# Patient Record
Sex: Male | Born: 1960 | Race: Black or African American | Hispanic: No | Smoking: Current every day smoker
Health system: Southern US, Community
[De-identification: ages and names within clinical notes are randomized; demographics above are authoritative.]

## PROBLEM LIST (undated history)

## (undated) DIAGNOSIS — M549 Dorsalgia, unspecified: Secondary | ICD-10-CM

## (undated) DIAGNOSIS — J449 Chronic obstructive pulmonary disease, unspecified: Secondary | ICD-10-CM

## (undated) DIAGNOSIS — R194 Change in bowel habit: Secondary | ICD-10-CM

## (undated) DIAGNOSIS — I10 Essential (primary) hypertension: Secondary | ICD-10-CM

## (undated) DIAGNOSIS — R05 Cough: Secondary | ICD-10-CM

## (undated) DIAGNOSIS — R140 Abdominal distension (gaseous): Secondary | ICD-10-CM

## (undated) DIAGNOSIS — J45909 Unspecified asthma, uncomplicated: Secondary | ICD-10-CM

## (undated) DIAGNOSIS — C20 Malignant neoplasm of rectum: Secondary | ICD-10-CM

## (undated) DIAGNOSIS — K5900 Constipation, unspecified: Secondary | ICD-10-CM

## (undated) DIAGNOSIS — R159 Full incontinence of feces: Secondary | ICD-10-CM

## (undated) DIAGNOSIS — K219 Gastro-esophageal reflux disease without esophagitis: Secondary | ICD-10-CM

## (undated) DIAGNOSIS — R630 Anorexia: Secondary | ICD-10-CM

## (undated) DIAGNOSIS — R059 Cough, unspecified: Secondary | ICD-10-CM

## (undated) DIAGNOSIS — R0602 Shortness of breath: Secondary | ICD-10-CM

## (undated) DIAGNOSIS — C801 Malignant (primary) neoplasm, unspecified: Secondary | ICD-10-CM

## (undated) DIAGNOSIS — K625 Hemorrhage of anus and rectum: Secondary | ICD-10-CM

## (undated) DIAGNOSIS — R197 Diarrhea, unspecified: Secondary | ICD-10-CM

## (undated) DIAGNOSIS — R61 Generalized hyperhidrosis: Secondary | ICD-10-CM

## (undated) DIAGNOSIS — R634 Abnormal weight loss: Secondary | ICD-10-CM

## (undated) HISTORY — DX: Change in bowel habit: R19.4

## (undated) HISTORY — DX: Hemorrhage of anus and rectum: K62.5

## (undated) HISTORY — DX: Full incontinence of feces: R15.9

## (undated) HISTORY — DX: Generalized hyperhidrosis: R61

## (undated) HISTORY — DX: Constipation, unspecified: K59.00

## (undated) HISTORY — DX: Cough, unspecified: R05.9

## (undated) HISTORY — PX: ABDOMINAL SURGERY: SHX537

## (undated) HISTORY — DX: Gastro-esophageal reflux disease without esophagitis: K21.9

## (undated) HISTORY — DX: Shortness of breath: R06.02

## (undated) HISTORY — PX: OTHER SURGICAL HISTORY: SHX169

## (undated) HISTORY — DX: Dorsalgia, unspecified: M54.9

## (undated) HISTORY — DX: Abdominal distension (gaseous): R14.0

## (undated) HISTORY — DX: Diarrhea, unspecified: R19.7

## (undated) HISTORY — DX: Anorexia: R63.0

## (undated) HISTORY — DX: Abnormal weight loss: R63.4

---
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
## 1998-02-13 ENCOUNTER — Emergency Department (HOSPITAL_COMMUNITY): Admission: EM | Admit: 1998-02-13 | Discharge: 1998-02-13 | Payer: Self-pay | Admitting: Emergency Medicine

## 2000-02-06 ENCOUNTER — Ambulatory Visit (HOSPITAL_COMMUNITY): Admission: RE | Admit: 2000-02-06 | Discharge: 2000-02-06 | Payer: Self-pay | Admitting: Chiropractic Medicine

## 2000-02-06 ENCOUNTER — Encounter: Payer: Self-pay | Admitting: Chiropractic Medicine

## 2000-03-07 ENCOUNTER — Encounter: Payer: Self-pay | Admitting: Neurosurgery

## 2000-03-07 ENCOUNTER — Ambulatory Visit (HOSPITAL_COMMUNITY): Admission: RE | Admit: 2000-03-07 | Discharge: 2000-03-07 | Payer: Self-pay | Admitting: Neurosurgery

## 2000-05-01 ENCOUNTER — Ambulatory Visit (HOSPITAL_COMMUNITY): Admission: RE | Admit: 2000-05-01 | Discharge: 2000-05-01 | Payer: Self-pay | Admitting: Neurosurgery

## 2000-05-01 ENCOUNTER — Encounter: Payer: Self-pay | Admitting: Neurosurgery

## 2000-12-29 ENCOUNTER — Emergency Department (HOSPITAL_COMMUNITY): Admission: EM | Admit: 2000-12-29 | Discharge: 2000-12-29 | Payer: Self-pay | Admitting: Emergency Medicine

## 2000-12-29 ENCOUNTER — Encounter: Payer: Self-pay | Admitting: Emergency Medicine

## 2001-08-19 ENCOUNTER — Emergency Department (HOSPITAL_COMMUNITY): Admission: EM | Admit: 2001-08-19 | Discharge: 2001-08-19 | Payer: Self-pay

## 2001-08-19 ENCOUNTER — Encounter: Payer: Self-pay | Admitting: Emergency Medicine

## 2001-09-18 ENCOUNTER — Ambulatory Visit (HOSPITAL_COMMUNITY): Admission: RE | Admit: 2001-09-18 | Discharge: 2001-09-18 | Payer: Self-pay | Admitting: Neurosurgery

## 2001-09-18 ENCOUNTER — Encounter: Payer: Self-pay | Admitting: Neurosurgery

## 2001-09-24 ENCOUNTER — Ambulatory Visit (HOSPITAL_COMMUNITY): Admission: RE | Admit: 2001-09-24 | Discharge: 2001-09-24 | Payer: Self-pay | Admitting: Neurosurgery

## 2001-09-24 ENCOUNTER — Encounter: Payer: Self-pay | Admitting: Neurosurgery

## 2001-10-07 ENCOUNTER — Ambulatory Visit (HOSPITAL_COMMUNITY): Admission: RE | Admit: 2001-10-07 | Discharge: 2001-10-07 | Payer: Self-pay | Admitting: Neurosurgery

## 2001-10-07 ENCOUNTER — Encounter: Payer: Self-pay | Admitting: Neurosurgery

## 2001-12-03 ENCOUNTER — Encounter: Admission: RE | Admit: 2001-12-03 | Discharge: 2001-12-03 | Payer: Self-pay | Admitting: Neurosurgery

## 2001-12-03 ENCOUNTER — Encounter: Payer: Self-pay | Admitting: Neurosurgery

## 2004-03-31 ENCOUNTER — Emergency Department (HOSPITAL_COMMUNITY): Admission: EM | Admit: 2004-03-31 | Discharge: 2004-03-31 | Payer: Self-pay | Admitting: Emergency Medicine

## 2005-12-21 ENCOUNTER — Emergency Department (HOSPITAL_COMMUNITY): Admission: EM | Admit: 2005-12-21 | Discharge: 2005-12-21 | Payer: Self-pay | Admitting: Emergency Medicine

## 2007-05-19 ENCOUNTER — Emergency Department: Payer: Self-pay

## 2007-12-06 ENCOUNTER — Emergency Department (HOSPITAL_COMMUNITY): Admission: EM | Admit: 2007-12-06 | Discharge: 2007-12-06 | Payer: Self-pay | Admitting: Family Medicine

## 2008-03-07 ENCOUNTER — Emergency Department (HOSPITAL_COMMUNITY): Admission: EM | Admit: 2008-03-07 | Discharge: 2008-03-07 | Payer: Self-pay | Admitting: Emergency Medicine

## 2009-10-10 ENCOUNTER — Emergency Department (HOSPITAL_COMMUNITY): Admission: EM | Admit: 2009-10-10 | Discharge: 2009-10-10 | Payer: Self-pay | Admitting: Family Medicine

## 2010-08-09 ENCOUNTER — Emergency Department (HOSPITAL_COMMUNITY): Admission: EM | Admit: 2010-08-09 | Discharge: 2010-04-04 | Payer: Self-pay | Admitting: Emergency Medicine

## 2010-11-12 ENCOUNTER — Inpatient Hospital Stay (INDEPENDENT_AMBULATORY_CARE_PROVIDER_SITE_OTHER)
Admission: RE | Admit: 2010-11-12 | Discharge: 2010-11-12 | Disposition: A | Discharge status: Home / Self Care | Payer: Self-pay | Source: Ambulatory Visit | Attending: Family Medicine | Admitting: Family Medicine

## 2010-11-12 DIAGNOSIS — Z760 Encounter for issue of repeat prescription: Secondary | ICD-10-CM

## 2010-11-12 DIAGNOSIS — I10 Essential (primary) hypertension: Secondary | ICD-10-CM

## 2010-11-12 LAB — POCT I-STAT, CHEM 8
BUN: 8 mg/dL (ref 6–23)
Calcium, Ion: 1.15 mmol/L (ref 1.12–1.32)
Chloride: 104 mEq/L (ref 96–112)
Creatinine, Ser: 1.1 mg/dL (ref 0.4–1.5)
Glucose, Bld: 101 mg/dL — ABNORMAL HIGH (ref 70–99)
HCT: 47 % (ref 39.0–52.0)
Hemoglobin: 16 g/dL (ref 13.0–17.0)
Potassium: 4.5 mEq/L (ref 3.5–5.1)
Sodium: 139 mEq/L (ref 135–145)
TCO2: 24 mmol/L (ref 0–100)

## 2010-11-16 LAB — URINALYSIS, ROUTINE W REFLEX MICROSCOPIC
Bilirubin Urine: NEGATIVE
Glucose, UA: NEGATIVE mg/dL
Hgb urine dipstick: NEGATIVE
Ketones, ur: NEGATIVE mg/dL
Nitrite: NEGATIVE
Protein, ur: NEGATIVE mg/dL
Specific Gravity, Urine: 1.005 (ref 1.005–1.030)
Urobilinogen, UA: 0.2 mg/dL (ref 0.0–1.0)
pH: 5 (ref 5.0–8.0)

## 2010-11-16 LAB — DIFFERENTIAL
Lymphocytes Relative: 21 % (ref 12–46)
Lymphs Abs: 2.8 10*3/uL (ref 0.7–4.0)
Monocytes Absolute: 0.8 10*3/uL (ref 0.1–1.0)
Monocytes Relative: 6 % (ref 3–12)
Neutro Abs: 9.2 10*3/uL — ABNORMAL HIGH (ref 1.7–7.7)
Neutrophils Relative %: 72 % (ref 43–77)

## 2010-11-16 LAB — COMPREHENSIVE METABOLIC PANEL
Albumin: 4.3 g/dL (ref 3.5–5.2)
BUN: 4 mg/dL — ABNORMAL LOW (ref 6–23)
Calcium: 8.4 mg/dL (ref 8.4–10.5)
Creatinine, Ser: 1.2 mg/dL (ref 0.4–1.5)
Glucose, Bld: 93 mg/dL (ref 70–99)
Potassium: 4.7 mEq/L (ref 3.5–5.1)
Total Protein: 7.9 g/dL (ref 6.0–8.3)

## 2010-11-16 LAB — CBC
MCH: 36.7 pg — ABNORMAL HIGH (ref 26.0–34.0)
MCHC: 35.3 g/dL (ref 30.0–36.0)
MCV: 103.8 fL — ABNORMAL HIGH (ref 78.0–100.0)
Platelets: 197 10*3/uL (ref 150–400)
RDW: 12.7 % (ref 11.5–15.5)
WBC: 12.8 10*3/uL — ABNORMAL HIGH (ref 4.0–10.5)

## 2011-01-18 NOTE — Op Note (Signed)
[place]. [facility]  Patient:    Jack Lester, Jack Lester Visit Number: [id number] MRN: [id number]          Service Type: DSU Location: [id number] Attending Physician:  Donn Pierini Dictated by:   Julio Sicks, M.D. Proc. 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  OPERATION PERFORMED:  C4-5 anterior cervical diskectomy and fusion with allograft and anterior plating.  SURGEON:  Julio Sicks, M.D.  ASSISTANT:  Donalee Citrin, Montez Hageman.  ANESTHESIA:  General endotracheal.  INDICATIONS FOR PROCEDURE:  Jack Lester is a 50 year old black male who is status post previous C5-6 anterior cervical diskectomy and fusion with allograft and anterior plating.  The patient presents now with increasing neck pain and bilateral upper extremities numbness, paresthesias and weakness consistent with a progressive cervical myelopathy.  MRI scanning demonstrates a large C4-5 disk herniation with spinal cord compression.  The patient has been counseled as to his options.  He has decided to proceed with C4-5 anterior cervical diskectomy with allograft and anterior plating.  DESCRIPTION OF PROCEDURE:  The patient was taken to the operating room and placed on the operating table in supine position.  After an adequate level of anesthesia was achieved, the patient was positioned supine with the neck slightly extended and held in place with halter traction.  The patients anterior cervical region was shaved and prepped sterilely.  A 10 blade was used to make a linear incision overlying the C4-5 level.  This was carried down sharply to the platysma.  The platysma was then divided vertically and dissection proceeded along the medial border of the  sternocleidomastoid muscle and carotid sheath.  The trachea and esophagus were mobilized and retracted towards the left.  Prevertebral fascia was stripped off the anterior spinal column.  The longus colli muscles were then elevated bilaterally.  Deep self-retaining retractor was placed.  Intraoperative fluoroscopy was used and the C level was confirmed.  The disk space at C4-5 was then incised with a 15 blade in a rectangular fashion.  A wide disk space clean-out was then achieved using pituitary rongeurs, forward and backward angled Carlens curets, Kerrison rongeurs and a high speed drill.  It should be noted that the C4-5 disk space was remarkably loose in nature and freely mobile.  The remaining aspects of osteophytes and annulus were removed down to the level of the posterior longitudinal ligament utilizing the high speed drill.  The posterior longitudinal ligament was then elevated and resected in piecemeal fashion using Kerrison rongeurs.  The underlying thecal sac was identified.  A wide central decompression was then performed by undercutting the bodies of C4 and C5.  Decompression then proceeded out to each C5 neural foramen.  The C5 nerve roots were identified proximally and free of any compression.  All elements of disk herniation were completely resected along the way.  There was no evidence of injury to thecal sac or nerve roots.  The wound was then copiously irrigated with antibiotic solution.  Gelfoam was placed topically for hemostasis which was found to be good.  The disk space was then distracted and an 8 mm patellar wedge allograft was impacted into place and recessed  approximately 1 mm from the anterior cortical surface.  A 25 mm Atlantis anterior cervical plate was then placed over the C4 and C5 bodies.  It was then attached under fluoroscopic guidance by using two 16 mm fixed angle screws at C4 and at C5.  All four screws were given a final tightening and found to be  solidly within bone.  Locking screws were engaged at both levels.  Final images revealed good position of the bone grafts and hardware at the proper operative level with normal alignment of the spine.  Wound was irrigated with antibiotic solution.  Hemostasis was achieved with bipolar electrocautery.  The wound was then closed in typical fashion.  Steri-Strips and sterile dressings were applied.  There were no apparent complications. The patient tolerated the procedure well and he returned to the recovery room postoperatively. Dictated by:   Julio Sicks, M.D. Attending Physician:  Donn Pierini DD:  10/07/01 TD:  10/08/01 Job: [id number]

## 2011-01-18 NOTE — Op Note (Signed)
[place]. [facility]  Patient:    Jack Lester, Jack Lester                         MRN: [id number] Proc. Date: 03/07/00 Adm. Date:  [id number] Attending:  Donn Pierini                           Operative Report  SERVICE:  Neurosurgery.  PREOPERATIVE DIAGNOSIS:  Central C5-6 herniated nucleus pulposus with myelopathy.  POSTOPERATIVE DIAGNOSIS:  Central C5-6 herniated nucleus pulposus with myelopathy.  PROCEDURE:  C5-6 anterior cervical diskectomy and fusion with allograft.  SURGEON:  Julio Sicks, M.D.  ASSISTANT:  Reinaldo Meeker, M.D.  ANESTHESIA:  General endotracheal.  INDICATIONS:  Jack Lester is a 50 year old black male who was injured moving a washing machine.  The patient has resultant neck and bilateral upper extremity pain and paresthesias, somewhat greater on the right side.  Workup has included an MRI scan of his cervical spine which demonstrates a central disk herniation at C5-6 with marked spinal cord compression.  Patient has been counseled as to his options.  He has decided to proceed with a C5-6 anterior cervical diskectomy and fusion with allograft.  DESCRIPTION OF PROCEDURE:  Patient was taken to the operating room and placed on the operating table in supine position.  After adequate level of anesthesia was achieved, patient was positioned supine with his neck slightly extended and held in place with 5 pounds of halter traction.  Patients anterior cervical region was shaved and prepped sterilely.  A 10 blade was used to make a linear skin incision overlying the C5-6 level.  This was carried down sharply to the platysma.  The platysma was then divided vertically and dissection proceeded along the medial border of the sternocleidomastoid muscle and carotid sheath on the right side.  Trachea and esophagus were mobilized and retracted towards the left.  Prevertebral fascia was stripped off the anterior spinal column.  The longus colli muscle  was then elevated bilaterally using electrocautery.  Deep self-retaining retractor was placed.  X-ray was taken and the C5-6 level was confirmed.  Disk space was then incised with a 15 blade in a rectangular fashion.  A wide disk space clean-out was then achieved using pituitary rongeurs, forward- and backward-angled Carlens curettes, Kerrison rongeurs and the high-speed drill.  All elements of the disk were removed down to the level of the posterior annulus.  Microscope was brought into the field and used throughout the remainder of the diskectomy. The remaining aspects of the annulus and osteophytes were removed using a high-speed drill and Kerrison rongeurs.  The posterior longitudinal ligament was then elevated and resected in piecemeal fashion.  The interligamentous disk herniation was encountered and completely resected.  The dura was exposed.  A wide decompression of the central canal was then performed using Kerrison rongeurs.  The decompression then proceeded out into the C6 foramen bilaterally.  At this point, there was no evidence of any residual stenosis. There was no evidence of any residual disk herniation.  There was no evidence of injury to the thecal sac or nerve root.  Wound was then copiously irrigated with antibiotic solution.  Gelfoam was placed topically for hemostasis, which was found to be good.  The disk space was then distracted and a 7-mm fibular wedge allograft was then packed into place and recessed approximately 1 mm from the anterior cortical surface.  Microscope and retractor system were removed.  Intraoperative x-ray was then taken, revealing good position of bone graft at the proper operative level with normal alignment of the spine.  Wound was then copiously irrigated with antibiotic solution.  Hemostasis was then achieved with the bipolar electrocautery.  Wound was then closed with 3-0 Vicryl at the platysma and 5-0 PDS in inverted subcuticular fashion at  the skin.  Steri-Strips and a sterile dressing were applied.  There were no apparent complications.  Patient tolerated the procedure well and he returns to the recovery room postoperatively. DD:  03/07/00 TD:  03/07/00 Job: [id number]

## 2011-03-10 ENCOUNTER — Inpatient Hospital Stay (INDEPENDENT_AMBULATORY_CARE_PROVIDER_SITE_OTHER)
Admission: RE | Admit: 2011-03-10 | Discharge: 2011-03-10 | Disposition: A | Discharge status: Home / Self Care | Payer: Self-pay | Source: Ambulatory Visit | Attending: Family Medicine | Admitting: Family Medicine

## 2011-03-10 DIAGNOSIS — I10 Essential (primary) hypertension: Secondary | ICD-10-CM

## 2011-03-10 DIAGNOSIS — Z760 Encounter for issue of repeat prescription: Secondary | ICD-10-CM

## 2011-05-28 LAB — URINALYSIS, ROUTINE W REFLEX MICROSCOPIC
Hgb urine dipstick: NEGATIVE
Nitrite: NEGATIVE
Protein, ur: NEGATIVE
Specific Gravity, Urine: 1.006
Urobilinogen, UA: 0.2

## 2011-05-28 LAB — CBC
MCHC: 33.6
MCV: 107.3 — ABNORMAL HIGH
Platelets: 147 — ABNORMAL LOW
RBC: 3.84 — ABNORMAL LOW
WBC: 8.5

## 2011-05-28 LAB — DIFFERENTIAL
Eosinophils Absolute: 0.3
Eosinophils Relative: 3
Lymphocytes Relative: 32
Lymphs Abs: 2.7
Monocytes Absolute: 1
Monocytes Relative: 12

## 2011-05-28 LAB — COMPREHENSIVE METABOLIC PANEL
ALT: 11
AST: 20
Albumin: 3.4 — ABNORMAL LOW
CO2: 27
Calcium: 8.8
Chloride: 105
Creatinine, Ser: 0.91
GFR calc Af Amer: >60
GFR calc non Af Amer: >60
Glucose, Bld: 108 — ABNORMAL HIGH
Sodium: 139

## 2011-05-28 LAB — INFLUENZA A AND B ANTIGEN (CONVERTED LAB)
Inflenza A Ag: NEGATIVE
Influenza B Ag: NEGATIVE

## 2012-01-14 ENCOUNTER — Emergency Department (HOSPITAL_COMMUNITY)
Admission: EM | Admit: 2012-01-14 | Discharge: 2012-01-14 | Disposition: A | Discharge status: Home / Self Care | Payer: Self-pay | Attending: Emergency Medicine | Admitting: Emergency Medicine

## 2012-01-14 ENCOUNTER — Encounter (HOSPITAL_COMMUNITY): Payer: Self-pay | Admitting: *Deleted

## 2012-01-14 DIAGNOSIS — R6883 Chills (without fever): Secondary | ICD-10-CM | POA: Insufficient documentation

## 2012-01-14 DIAGNOSIS — I10 Essential (primary) hypertension: Secondary | ICD-10-CM | POA: Insufficient documentation

## 2012-01-14 DIAGNOSIS — J40 Bronchitis, not specified as acute or chronic: Secondary | ICD-10-CM | POA: Insufficient documentation

## 2012-01-14 DIAGNOSIS — R0602 Shortness of breath: Secondary | ICD-10-CM | POA: Insufficient documentation

## 2012-01-14 DIAGNOSIS — Z720 Tobacco use: Secondary | ICD-10-CM

## 2012-01-14 DIAGNOSIS — R062 Wheezing: Secondary | ICD-10-CM | POA: Insufficient documentation

## 2012-01-14 DIAGNOSIS — F17200 Nicotine dependence, unspecified, uncomplicated: Secondary | ICD-10-CM | POA: Insufficient documentation

## 2012-01-14 DIAGNOSIS — J3489 Other specified disorders of nose and nasal sinuses: Secondary | ICD-10-CM | POA: Insufficient documentation

## 2012-01-14 DIAGNOSIS — R059 Cough, unspecified: Secondary | ICD-10-CM | POA: Insufficient documentation

## 2012-01-14 DIAGNOSIS — Z79899 Other long term (current) drug therapy: Secondary | ICD-10-CM | POA: Insufficient documentation

## 2012-01-14 DIAGNOSIS — R05 Cough: Secondary | ICD-10-CM | POA: Insufficient documentation

## 2012-01-14 HISTORY — DX: Essential (primary) hypertension: I10

## 2012-01-14 MED ORDER — ALBUTEROL SULFATE HFA 108 (90 BASE) MCG/ACT IN AERS: 1.0000 | INHALATION_SPRAY | Freq: Four times a day (QID) | RESPIRATORY_TRACT | Status: DC | PRN

## 2012-01-14 MED ORDER — AMLODIPINE BESYLATE 10 MG PO TABS
10.0000 mg | ORAL_TABLET | Freq: Once | ORAL | Status: AC
  Administered 2012-01-14: 10 mg via ORAL
  Filled 2012-01-14: qty 1

## 2012-01-14 MED ORDER — AMLODIPINE BESYLATE 10 MG PO TABS: ORAL_TABLET | ORAL | Status: DC

## 2012-01-14 MED ORDER — ALBUTEROL SULFATE HFA 108 (90 BASE) MCG/ACT IN AERS
2.0000 | INHALATION_SPRAY | RESPIRATORY_TRACT | Status: DC | PRN
  Administered 2012-01-14: 2 via RESPIRATORY_TRACT
  Filled 2012-01-14: qty 6.7

## 2012-01-14 NOTE — Discharge Instructions (Signed)
Use inhaler as directed for cough and shortness of breath but please know that quitting smoking is the number one treatment/fix for improving lung function and reducing risk for lung disease and infection. Take Norvasc as directed for your high blood pressure but establishment with a Primary Care provider is Very important for general health care concerns, minor illness and minor injury and for ongoing management of blood pressure. However, return to ER at any time for emergent changing or worsening of symptoms.   Bronchitis Bronchitis is the body's way of reacting to injury and/or infection (inflammation) of the bronchi. Bronchi are the air tubes that extend from the windpipe into the lungs. If the inflammation becomes severe, it may cause shortness of breath. CAUSES  Inflammation may be caused by:  A virus.   Germs (bacteria).   Dust.   Allergens.   Pollutants and many other irritants.  The cells lining the bronchial tree are covered with tiny hairs (cilia). These constantly beat upward, away from the lungs, toward the mouth. This keeps the lungs free of pollutants. When these cells become too irritated and are unable to do their job, mucus begins to develop. This causes the characteristic cough of bronchitis. The cough clears the lungs when the cilia are unable to do their job. Without either of these protective mechanisms, the mucus would settle in the lungs. Then you would develop pneumonia. Smoking is a common cause of bronchitis and can contribute to pneumonia. Stopping this habit is the single most important thing you can do to help yourself. TREATMENT   Your caregiver may prescribe an antibiotic if the cough is caused by bacteria. Also, medicines that open up your airways make it easier to breathe. Your caregiver may also recommend or prescribe an expectorant. It will loosen the mucus to be coughed up. Only take over-the-counter or prescription medicines for pain, discomfort, or fever  as directed by your caregiver.   Removing whatever causes the problem (smoking, for example) is critical to preventing the problem from getting worse.   Cough suppressants may be prescribed for relief of cough symptoms.   Inhaled medicines may be prescribed to help with symptoms now and to help prevent problems from returning.   For those with recurrent (chronic) bronchitis, there may be a need for steroid medicines.  SEEK IMMEDIATE MEDICAL CARE IF:   During treatment, you develop more pus-like mucus (purulent sputum).   You have a fever.   Your baby is older than 3 months with a rectal temperature of 102 F (38.9 C) or higher.   Your baby is 3 months old or younger with a rectal temperature of 100.4 F (38 C) or higher.   You become progressively more ill.   You have increased difficulty breathing, wheezing, or shortness of breath.  It is necessary to seek immediate medical care if you are elderly or sick from any other disease. MAKE SURE YOU:   Understand these instructions.   Will watch your condition.   Will get help right away if you are not doing well or get worse.  Document Released: 08/19/2005 Document Revised: 08/08/2011 Document Reviewed: 06/28/2008 [facility] Patient Information 2012 [place], [place].  Hypertension Hypertension is another name for high blood pressure. High blood pressure may mean that your heart needs to work harder to pump blood. Blood pressure consists of two numbers, which includes a higher number over a lower number (example: 110/72). HOME CARE   Make lifestyle changes as told by your doctor. This may include  weight loss and exercise.   Take your blood pressure medicine every day.   Limit how much salt you use.   Stop smoking if you smoke.   Do not use drugs.   Talk to your doctor if you are using decongestants or birth control pills. These medicines might make blood pressure higher.   Females should not drink more than 1 alcoholic drink  per day. Males should not drink more than 2 alcoholic drinks per day.   See your doctor as told.  GET HELP RIGHT AWAY IF:   You have a blood pressure reading with a top number of 180 or higher.   You get a very bad headache.   You get blurred or changing vision.   You feel confused.   You feel weak, numb, or faint.   You get chest or belly (abdominal) pain.   You throw up (vomit).   You cannot breathe very well.  MAKE SURE YOU:   Understand these instructions.   Will watch your condition.   Will get help right away if you are not doing well or get worse.  Document Released: 02/05/2008 Document Revised: 08/08/2011 Document Reviewed: 02/05/2008 [facility] Patient Information 2012 [place], [place].  Smoking Cessation, Tips for Success YOU CAN QUIT SMOKING If you are ready to quit smoking, congratulations! You have chosen to help yourself be healthier. Cigarettes bring nicotine, tar, carbon monoxide, and other irritants into your body. Your lungs, heart, and blood vessels will be able to work better without these poisons. There are many different ways to quit smoking. Nicotine gum, nicotine patches, a nicotine inhaler, or nicotine nasal spray can help with physical craving. Hypnosis, support groups, and medicines help break the habit of smoking. Here are some tips to help you quit for good.  Throw away all cigarettes.   Clean and remove all ashtrays from your home, work, and car.   On a card, write down your reasons for quitting. Carry the card with you and read it when you get the urge to smoke.   Cleanse your body of nicotine. Drink enough water and fluids to keep your urine clear or pale yellow. Do this after quitting to flush the nicotine from your body.   Learn to predict your moods. Do not let a bad situation be your excuse to have a cigarette. Some situations in your life might tempt you into wanting a cigarette.   Never have "just one" cigarette. It leads to wanting  another and another. Remind yourself of your decision to quit.   Change habits associated with smoking. If you smoked while driving or when feeling stressed, try other activities to replace smoking. Stand up when drinking your coffee. Brush your teeth after eating. Sit in a different chair when you read the paper. Avoid alcohol while trying to quit, and try to drink fewer caffeinated beverages. Alcohol and caffeine may urge you to smoke.   Avoid foods and drinks that can trigger a desire to smoke, such as sugary or spicy foods and alcohol.   Ask people who smoke not to smoke around you.   Have something planned to do right after eating or having a cup of coffee. Take a walk or exercise to perk you up. This will help to keep you from overeating.   Try a relaxation exercise to calm you down and decrease your stress. Remember, you may be tense and nervous for the first 2 weeks after you quit, but this will pass.   Find  new activities to keep your hands busy. Play with a pen, coin, or rubber band. Doodle or draw things on paper.   Brush your teeth right after eating. This will help cut down on the craving for the taste of tobacco after meals. You can try mouthwash, too.   Use oral substitutes, such as lemon drops, carrots, a cinnamon stick, or chewing gum, in place of cigarettes. Keep them handy so they are available when you have the urge to smoke.   When you have the urge to smoke, try deep breathing.   Designate your home as a nonsmoking area.   If you are a heavy smoker, ask your caregiver about a prescription for nicotine chewing gum. It can ease your withdrawal from nicotine.   Reward yourself. Set aside the cigarette money you save and buy yourself something nice.   Look for support from others. Join a support group or smoking cessation program. Ask someone at home or at work to help you with your plan to quit smoking.   Always ask yourself, "Do I need this cigarette or is this just a  reflex?" Tell yourself, "Today, I choose not to smoke," or "I do not want to smoke." You are reminding yourself of your decision to quit, even if you do smoke a cigarette.  HOW WILL I FEEL WHEN I QUIT SMOKING?  The benefits of not smoking start within days of quitting.   You may have symptoms of withdrawal because your body is used to nicotine (the addictive substance in cigarettes). You may crave cigarettes, be irritable, feel very hungry, cough often, get headaches, or have difficulty concentrating.   The withdrawal symptoms are only temporary. They are strongest when you first quit but will go away within 10 to 14 days.   When withdrawal symptoms occur, stay in control. Think about your reasons for quitting. Remind yourself that these are signs that your body is healing and getting used to being without cigarettes.   Remember that withdrawal symptoms are easier to treat than the major diseases that smoking can cause.   Even after the withdrawal is over, expect periodic urges to smoke. However, these cravings are generally short-lived and will go away whether you smoke or not. Do not smoke!   If you relapse and smoke again, do not lose hope. Most smokers quit 3 times before they are successful.   If you relapse, do not give up! Plan ahead and think about what you will do the next time you get the urge to smoke.  LIFE AS A NONSMOKER: MAKE IT FOR A MONTH, MAKE IT FOR LIFE Day 1: Hang this page where you will see it every day. Day 2: Get rid of all ashtrays, matches, and lighters. Day 3: Drink water. Breathe deeply between sips. Day 4: Avoid places with smoke-filled air, such as bars, clubs, or the smoking section of restaurants. Day 5: Keep track of how much money you save by not smoking. Day 6: Avoid boredom. Keep a good book with you or go to the movies. Day 7: Reward yourself! One week without smoking! Day 8: Make a dental appointment to get your teeth cleaned. Day 9: Decide how you  will turn down a cigarette before it is offered to you. Day 10: Review your reasons for quitting. Day 11: Distract yourself. Stay active to keep your mind off smoking and to relieve tension. Take a walk, exercise, read a book, do a crossword puzzle, or try a new hobby. Day  12: Exercise. Get off the bus before your stop or use stairs instead of escalators. Day 13: Call on friends for support and encouragement. Day 14: Reward yourself! Two weeks without smoking! Day 15: Practice deep breathing exercises. Day 16: Bet a friend that you can stay a nonsmoker. Day 17: Ask to sit in nonsmoking sections of restaurants. Day 18: Hang up "No Smoking" signs. Day 19: Think of yourself as a nonsmoker. Day 20: Each morning, tell yourself you will not smoke. Day 21: Reward yourself! Three weeks without smoking! Day 22: Think of smoking in negative ways. Remember how it stains your teeth, gives you bad breath, and leaves you short of breath. Day 23: Eat a nutritious breakfast. Day 24:Do not relive your days as a smoker. Day 25: Hold a pencil in your hand when talking on the telephone. Day 26: Tell all your friends you do not smoke. Day 27: Think about how much better food tastes. Day 28: Remember, one cigarette is one too many. Day 29: Take up a hobby that will keep your hands busy. Day 30: Congratulations! One month without smoking! Give yourself a big reward. Your caregiver can direct you to community resources or hospitals for support, which may include:  Group support.   Education.   Hypnosis.   Subliminal therapy.  Document Released: 05/17/2004 Document Revised: 08/08/2011 Document Reviewed: 06/05/2009 [facility] Patient Information 2012 [place], [place].

## 2012-01-14 NOTE — ED Notes (Signed)
Pt in c/o cough, congestion and shortness of breath since last night, also chills but unsure of fever

## 2012-01-14 NOTE — ED Notes (Signed)
States onset cough yesterday evening around 5 pm. States has had some chills but unsure about fever. Denies hx of asthma, states smokes 1 ppd, states he heard himself wheezing last night.

## 2012-01-14 NOTE — ED Provider Notes (Signed)
History     CSN: [id number]  Arrival date & time 01/14/12  1340   First MD Initiated Contact with Patient 01/14/12 1415      Chief Complaint  Patient presents with  . URI    (Consider location/radiation/quality/duration/timing/severity/associated sxs/prior treatment) HPI  Patient who has history of hypertension, tobacco abuse, and intermittent bronchitis presents to emergency department complaining of a 2 to 3 day history of cough, congestion, shortness of breath, and wheezing. Patient also states he's been out of his blood pressure medication for the last 3 days, Norvasc 10mg . Patient states that he had lost his job and therefore lost his insurance however he is now currently working once again however does not have a primary care provider. Patient denies known fevers but has felt chilled. He denies chest pain, hemoptysis, difficulty breathing, abdominal pain, nausea, vomiting, diarrhea, lower extremity pain or swelling. Patient states cough and wheezing as well as shortness of breath is aggravated by smoking and exertion and improved with rest. Patient has taken nothing for symptoms prior to arrival.  Past Medical History  Diagnosis Date  . Hypertension     History reviewed. No pertinent past surgical history.  History reviewed. No pertinent family history.  History  Substance Use Topics  . Smoking status: Current Everyday Smoker  . Smokeless tobacco: Not on file  . Alcohol Use: Yes      Review of Systems  All other systems reviewed and are negative.    Allergies  Review of patient's allergies indicates no known allergies.  Home Medications   Current Outpatient Rx  Name Route Sig Dispense Refill  . AMLODIPINE BESYLATE 10 MG PO TABS Oral Take 10 mg by mouth daily.    Marland Kitchen ALKA-SELTZER PLUS COLD PO Oral Take 1 tablet by mouth daily as needed. For cold.      BP 171/111  Pulse 63  Temp(Src) 97.9 F (36.6 C) (Oral)  Resp 20  SpO2 100%  Physical Exam  Nursing note  and vitals reviewed. Constitutional: He is oriented to person, place, and time. He appears well-developed and well-nourished. No distress.  HENT:  Head: Normocephalic and atraumatic.  Eyes: Conjunctivae are normal.  Neck: Normal range of motion. Neck supple.  Cardiovascular: Normal rate, regular rhythm, normal heart sounds and intact distal pulses.  Exam reveals no gallop and no friction rub.   No murmur heard. Pulmonary/Chest: Effort normal and breath sounds normal. No respiratory distress. He has no wheezes. He has no rales. He exhibits no tenderness.  Abdominal: Soft. Bowel sounds are normal. He exhibits no distension and no mass. There is no tenderness. There is no rebound and no guarding.  Musculoskeletal: Normal range of motion. He exhibits no edema and no tenderness.  Neurological: He is alert and oriented to person, place, and time.  Skin: Skin is warm and dry. No rash noted. He is not diaphoretic. No erythema.  Psychiatric: He has a normal mood and affect.    ED Course  Procedures (including critical care time)  Albuterol inhaler to bedside. PO norvasc.   Labs Reviewed - No data to display No results found.   1. Bronchitis   2. Hypertension   3. Tobacco abuse       MDM  Cough and wheezing but patient afebrile and pulse ox 100% on room air. No resp difficulty. PERC negative and low risk for PE. No swelling of lower extremities or complaints of leg pain. HTN without complaints of CP. I gave patient numerous PCP referrals. Patient  agreeable to PCP follow up.         [place], [place] 01/14/12 1437

## 2012-01-17 NOTE — ED Provider Notes (Signed)
Medical screening examination/treatment/procedure(s) were performed by non-physician practitioner and as supervising physician I was immediately available for consultation/collaboration.  Nathasha Fiorillo, MD 01/17/12 1323 

## 2012-03-10 ENCOUNTER — Emergency Department (HOSPITAL_COMMUNITY)
Admission: EM | Admit: 2012-03-10 | Discharge: 2012-03-10 | Disposition: A | Discharge status: Home / Self Care | Payer: BC Managed Care – PPO | Attending: Emergency Medicine | Admitting: Emergency Medicine

## 2012-03-10 ENCOUNTER — Encounter (HOSPITAL_COMMUNITY): Payer: Self-pay | Admitting: Emergency Medicine

## 2012-03-10 DIAGNOSIS — R42 Dizziness and giddiness: Secondary | ICD-10-CM | POA: Insufficient documentation

## 2012-03-10 DIAGNOSIS — F17200 Nicotine dependence, unspecified, uncomplicated: Secondary | ICD-10-CM | POA: Insufficient documentation

## 2012-03-10 DIAGNOSIS — I10 Essential (primary) hypertension: Secondary | ICD-10-CM | POA: Insufficient documentation

## 2012-03-10 MED ORDER — AMLODIPINE BESYLATE 10 MG PO TABS: 10.0000 mg | ORAL_TABLET | Freq: Every day | ORAL | Status: DC

## 2012-03-10 MED ORDER — AMLODIPINE BESYLATE 10 MG PO TABS
10.0000 mg | ORAL_TABLET | Freq: Once | ORAL | Status: AC
  Administered 2012-03-10: 10 mg via ORAL
  Filled 2012-03-10: qty 1

## 2012-03-10 NOTE — ED Notes (Addendum)
Pt continues to c/o dizziness denies any other complaints.Monitor intact. Pt waiting to be evaluated by MD. Delayed explained to pt.

## 2012-03-10 NOTE — ED Notes (Signed)
Patient presented to the ER with complaint of dizziness, blurry vision, shortness of breath onset at 1200 today. Pt also complains of elevated BP but denies any chest discomfort. Pt is A/A/Ox4, skin is warm and dry, respiration is even and unlabored.

## 2012-03-10 NOTE — ED Provider Notes (Signed)
History     CSN: [id number]  Arrival date & time 03/10/12  1401   First MD Initiated Contact with Patient 03/10/12 1913      Chief Complaint  Patient presents with  . Dizziness    (Consider location/radiation/quality/duration/timing/severity/associated sxs/prior treatment) Patient is a 51 y.o. male presenting with general illness. The history is provided by the patient.  Illness  The current episode started today. The onset was gradual. The problem occurs frequently. The problem has been unchanged. The problem is mild. Nothing relieves the symptoms. Exacerbated by: feels dizziness is worse with elevated blood pressures. Pertinent negatives include no fever, no abdominal pain, no nausea, no vomiting, no congestion, no headaches, no rhinorrhea, no sore throat, no neck pain, no cough, no wheezing and no rash. He has been behaving normally. He has been eating and drinking normally. Urine output has been normal. There were no sick contacts. He has received no recent medical care.    Past Medical History  Diagnosis Date  . Hypertension     History reviewed. No pertinent past surgical history.  History reviewed. No pertinent family history.  History  Substance Use Topics  . Smoking status: Current Everyday Smoker  . Smokeless tobacco: Not on file  . Alcohol Use: Yes      Review of Systems  Constitutional: Negative for fever, activity change, appetite change and fatigue.  HENT: Negative for congestion, sore throat, facial swelling, rhinorrhea, trouble swallowing, neck pain, neck stiffness, voice change and sinus pressure.   Eyes: Negative.   Respiratory: Negative for cough, choking, chest tightness, shortness of breath and wheezing.   Cardiovascular: Negative for chest pain.  Gastrointestinal: Negative for nausea, vomiting and abdominal pain.  Genitourinary: Negative for dysuria, urgency, frequency, hematuria, flank pain and difficulty urinating.  Musculoskeletal: Negative for  back pain and gait problem.  Skin: Negative for rash and wound.  Neurological: Positive for dizziness. Negative for facial asymmetry, weakness, numbness and headaches.  Psychiatric/Behavioral: Negative for behavioral problems, confusion and agitation. The patient is not nervous/anxious and is not hyperactive.   All other systems reviewed and are negative.    Allergies  Review of patient's allergies indicates no known allergies.  Home Medications   Current Outpatient Rx  Name Route Sig Dispense Refill  . ALBUTEROL SULFATE HFA 108 (90 BASE) MCG/ACT IN AERS Inhalation Inhale 2 puffs into the lungs every 6 (six) hours as needed. For shortness of breath    . AMLODIPINE BESYLATE 10 MG PO TABS Oral Take 10 mg by mouth daily.    Marland Kitchen AMLODIPINE BESYLATE 10 MG PO TABS Oral Take 1 tablet (10 mg total) by mouth daily. 30 tablet 1    BP 180/89  Pulse 59  Temp 98.4 F (36.9 C) (Oral)  Resp 11  SpO2 100%  Physical Exam  Nursing note and vitals reviewed. Constitutional: He is oriented to person, place, and time. He appears well-developed and well-nourished. No distress.  HENT:  Head: Normocephalic and atraumatic.  Right Ear: External ear normal.  Left Ear: External ear normal.  Mouth/Throat: No oropharyngeal exudate.  Eyes: Conjunctivae and EOM are normal. Pupils are equal, round, and reactive to light. Right eye exhibits no discharge. Left eye exhibits no discharge.  Neck: Normal range of motion. Neck supple. No JVD present. No tracheal deviation present. No thyromegaly present.  Cardiovascular: Normal rate, regular rhythm, normal heart sounds and intact distal pulses.  Exam reveals no gallop and no friction rub.   No murmur heard. Pulmonary/Chest: Effort normal and  breath sounds normal. No respiratory distress. He has no wheezes. He exhibits no tenderness.  Abdominal: Soft. Bowel sounds are normal. He exhibits no distension. There is no tenderness. There is no rebound and no guarding.    Musculoskeletal: Normal range of motion. He exhibits no edema and no tenderness.  Lymphadenopathy:    He has no cervical adenopathy.  Neurological: He is alert and oriented to person, place, and time. No cranial nerve deficit.  Skin: Skin is warm and dry. No rash noted. He is not diaphoretic. No pallor.  Psychiatric: He has a normal mood and affect. His behavior is normal.    ED Course  Procedures (including critical care time)  Labs Reviewed - No data to display No results found.   1. Hypertension   2. Dizziness       MDM  51 year old male patient with past medical history of hypertension controlled with Norvasc presents with dizziness. Patient says he gets dizzy when he is negative blood pressure medications and hasn't been taking them for the past 3 weeks. Patient says this dizzy episode started today approximately 6 hours prior to arriving to the hospital. Patient says nothing really makes it better or worse. Patient is not nauseated or vomiting. Patient without cerebellar signs with normal finger to nose heel to shin no truncal ataxia. Patient with normal neurological exam able to walk in the emergency department without difficulty. Dix-Hallpike maneuver does not show nystagmus. Movement of the head does not make the symptoms worse. Patient feels well and is asymptomatic after being in the emergency department. We'll refill blood pressure medication and have him followup with his PCP.  Date: 03/10/2012  Rate: 62  Rhythm: normal sinus rhythm  QRS Axis: normal  Intervals: normal  ST/T Wave abnormalities: early repolarization and LVH  Conduction Disutrbances:none  Narrative Interpretation:   Old EKG Reviewed: unchanged  EKG unchanged as above.  Case discussed with Dr. Christie Nottingham, MD 03/10/12 [phone]

## 2012-03-10 NOTE — ED Provider Notes (Signed)
Pt seen and examined with resident.  Presents with dizziness and intermittent headache.  No vertigo symptoms.  No headache now.  No neuro deficits.  No CP or SOB.  Pt has been out of his BP meds for about 2 weeks and says that he always feels like this when his BP is high.  Will get him restarted on meds  Rolan Bucco, MD 03/10/12 2133

## 2012-03-10 NOTE — ED Notes (Signed)
feeling dizzy when pt. Went to bathroom. Onset: 1200 n. Duration: 2 minutes. Also, occurred while walking from car to ed.

## 2012-03-11 NOTE — ED Provider Notes (Signed)
I saw and evaluated the patient, reviewed the resident's note and I agree with the findings and plan.   Rolan Bucco, MD 03/11/12 [phone]

## 2012-07-13 ENCOUNTER — Encounter (HOSPITAL_COMMUNITY): Payer: Self-pay | Admitting: Emergency Medicine

## 2012-07-13 ENCOUNTER — Emergency Department (INDEPENDENT_AMBULATORY_CARE_PROVIDER_SITE_OTHER): Payer: BC Managed Care – PPO

## 2012-07-13 ENCOUNTER — Emergency Department (HOSPITAL_COMMUNITY)
Admission: EM | Admit: 2012-07-13 | Discharge: 2012-07-13 | Disposition: A | Discharge status: Home / Self Care | Payer: BC Managed Care – PPO | Attending: Emergency Medicine | Admitting: Emergency Medicine

## 2012-07-13 ENCOUNTER — Emergency Department (INDEPENDENT_AMBULATORY_CARE_PROVIDER_SITE_OTHER)
Admission: EM | Admit: 2012-07-13 | Discharge: 2012-07-13 | Disposition: A | Discharge status: Home / Self Care | Payer: BC Managed Care – PPO | Source: Home / Self Care | Attending: Family Medicine | Admitting: Family Medicine

## 2012-07-13 DIAGNOSIS — Z79899 Other long term (current) drug therapy: Secondary | ICD-10-CM | POA: Insufficient documentation

## 2012-07-13 DIAGNOSIS — J441 Chronic obstructive pulmonary disease with (acute) exacerbation: Secondary | ICD-10-CM | POA: Insufficient documentation

## 2012-07-13 DIAGNOSIS — F17200 Nicotine dependence, unspecified, uncomplicated: Secondary | ICD-10-CM | POA: Insufficient documentation

## 2012-07-13 DIAGNOSIS — R059 Cough, unspecified: Secondary | ICD-10-CM | POA: Insufficient documentation

## 2012-07-13 DIAGNOSIS — R0789 Other chest pain: Secondary | ICD-10-CM | POA: Insufficient documentation

## 2012-07-13 DIAGNOSIS — I10 Essential (primary) hypertension: Secondary | ICD-10-CM | POA: Insufficient documentation

## 2012-07-13 DIAGNOSIS — J449 Chronic obstructive pulmonary disease, unspecified: Secondary | ICD-10-CM

## 2012-07-13 DIAGNOSIS — J45901 Unspecified asthma with (acute) exacerbation: Secondary | ICD-10-CM | POA: Insufficient documentation

## 2012-07-13 DIAGNOSIS — R05 Cough: Secondary | ICD-10-CM | POA: Insufficient documentation

## 2012-07-13 HISTORY — DX: Unspecified asthma, uncomplicated: J45.909

## 2012-07-13 LAB — POCT I-STAT, CHEM 8
BUN: 15 mg/dL (ref 6–23)
Calcium, Ion: 1.19 mmol/L (ref 1.12–1.23)
Creatinine, Ser: 1.3 mg/dL (ref 0.50–1.35)
Glucose, Bld: 86 mg/dL (ref 70–99)
Hemoglobin: 16 g/dL (ref 13.0–17.0)
TCO2: 25 mmol/L (ref 0–100)

## 2012-07-13 MED ORDER — ALBUTEROL SULFATE HFA 108 (90 BASE) MCG/ACT IN AERS: 2.0000 | INHALATION_SPRAY | RESPIRATORY_TRACT | Status: DC | PRN

## 2012-07-13 MED ORDER — PREDNISONE 20 MG PO TABS
60.0000 mg | ORAL_TABLET | Freq: Once | ORAL | Status: AC
  Administered 2012-07-13: 60 mg via ORAL
  Filled 2012-07-13: qty 1

## 2012-07-13 MED ORDER — PREDNISONE 50 MG PO TABS: 50.0000 mg | ORAL_TABLET | Freq: Every day | ORAL | Status: DC

## 2012-07-13 MED ORDER — AMLODIPINE BESYLATE 10 MG PO TABS: 10.0000 mg | ORAL_TABLET | Freq: Every day | ORAL | Status: DC

## 2012-07-13 MED ORDER — ALBUTEROL SULFATE (5 MG/ML) 0.5% IN NEBU
5.0000 mg | INHALATION_SOLUTION | Freq: Once | RESPIRATORY_TRACT | Status: AC
  Administered 2012-07-13: 5 mg via RESPIRATORY_TRACT
  Filled 2012-07-13: qty 1
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
## 2012-07-13 NOTE — ED Notes (Signed)
Pt sent from [facility] with increased SOB today; pt had chest xray that showed severe COPD and pt sent for further eval; pt denies other complaint and appears in nad at present; pt sts SOB with exertion

## 2012-07-13 NOTE — ED Notes (Signed)
Pt c/o SOB since this am... Denies: headaches, blurry vision, chest pains, edema, fever, vomiting, nausea, diarrhea... Hx of high blood pressure... Saturday was the last time he took his HB pressure med since he has run out... Pt is alert w/no signs of distress.
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
## 2012-07-14 NOTE — ED Provider Notes (Signed)
Medical screening examination/treatment/procedure(s) were performed by non-physician practitioner and as supervising physician I was immediately available for consultation/collaboration.  Flint Melter, MD 07/14/12 [phone]

## 2012-08-13 ENCOUNTER — Emergency Department (INDEPENDENT_AMBULATORY_CARE_PROVIDER_SITE_OTHER)
Admission: EM | Admit: 2012-08-13 | Discharge: 2012-08-13 | Disposition: A | Discharge status: Home / Self Care | Payer: BC Managed Care – PPO | Source: Home / Self Care | Attending: Emergency Medicine | Admitting: Emergency Medicine

## 2012-08-13 ENCOUNTER — Encounter (HOSPITAL_COMMUNITY): Payer: Self-pay | Admitting: *Deleted

## 2012-08-13 DIAGNOSIS — J449 Chronic obstructive pulmonary disease, unspecified: Secondary | ICD-10-CM

## 2012-08-13 DIAGNOSIS — I10 Essential (primary) hypertension: Secondary | ICD-10-CM

## 2012-08-13 HISTORY — DX: Chronic obstructive pulmonary disease, unspecified: J44.9

## 2012-08-13 MED ORDER — ALBUTEROL SULFATE HFA 108 (90 BASE) MCG/ACT IN AERS: 1.0000 | INHALATION_SPRAY | Freq: Four times a day (QID) | RESPIRATORY_TRACT | Status: DC | PRN

## 2012-08-13 MED ORDER — AMLODIPINE BESYLATE 10 MG PO TABS: 10.0000 mg | ORAL_TABLET | Freq: Every day | ORAL | Status: DC

## 2012-08-13 MED ORDER — TIOTROPIUM BROMIDE MONOHYDRATE 18 MCG IN CAPS: 18.0000 ug | ORAL_CAPSULE | Freq: Every day | RESPIRATORY_TRACT | Status: DC

## 2012-08-13 MED ORDER — PREDNISONE 10 MG PO TABS: ORAL_TABLET | ORAL | Status: DC

## 2012-08-13 NOTE — ED Notes (Signed)
Pt reports shortness of breath since this morning after running out of inhaler - dx with copd last week. States that he has cut back on smoking from 1 1/2 pk a day to 1/2 pack a day

## 2012-08-13 NOTE — ED Provider Notes (Signed)
Chief Complaint  Patient presents with  . Shortness of Breath    History of Present Illness:   The patient was seen here last month with a diagnosis of COPD. He was sent to the hospital and seen in the emergency room and then released. He was sent home on albuterol which does seem to be controlling his COPD. He continues to have cough productive of small amounts of yellow sputum, wheezing, chest tightness, and shortness of breath. He also has high blood pressure and is on Norvasc 10 mg per day. He continues to smoke cigarettes. He denies any chest pain, fever, or chills. He requests a refill on his medications today.  Review of Systems:  Other than noted above, the patient denies any of the following symptoms: Systemic:  No fevers, chills, sweats, weight loss or gain, fatigue, or tiredness. ENT:  No nasal congestion, sneezing, itching, postnasal drip, sinus pressure, headache, sore throat, or hoarseness. Lungs:  No wheezing, shortness of breath, chest tightness or congestion. Heart:  No chest pain, tightness, pressure, PND, orthopnea, or ankle edema. GI:  No indigestion, heartburn, waterbrash, burping, abdominal pain, nausea, or vomiting.  PMFSH:  Past medical history, family history, social history, meds, and allergies were reviewed.  Specifically, there is no history of asthma, allergies, reflux esophagitis or cigarette smoking.   Physical Exam:   Vital signs:  BP 159/103  Pulse 79  Temp 96.2 F (35.7 C) (Oral)  Resp 20  SpO2 95% General:  Alert and oriented.  In no distress.  Skin warm and dry. ENT: TMs and ear canals normal.  Nasal mucosa normal, without drainage.  Pharynx clear without exudate or drainage.  No intraoral lesions. Neck:  No adenopathy, tenderness or mass.  No JVD. Lungs:  No respiratory distress.  Breath sounds clear and equal bilaterally.  No wheezes, rales or rhonchi. Heart:  Regular rhythm, no gallops or murmers.  No pedal edema. Abdomon:  Soft and nontender.  No  organomegaly or mass.  Assessment:  The primary encounter diagnosis was COPD (chronic obstructive pulmonary disease). A diagnosis of Hypertension was also pertinent to this visit.  Plan:   1.  The following meds were prescribed:   New Prescriptions   ALBUTEROL (PROVENTIL HFA;VENTOLIN HFA) 108 (90 BASE) MCG/ACT INHALER    Inhale 1-2 puffs into the lungs every 6 (six) hours as needed for wheezing.   AMLODIPINE (NORVASC) 10 MG TABLET    Take 1 tablet (10 mg total) by mouth daily.   PREDNISONE (DELTASONE) 10 MG TABLET    Take 4 tabs daily for 4 days, 3 tabs daily for 4 days, 2 tabs daily for 4 days, then 1 tab daily for 4 days.   TIOTROPIUM (SPIRIVA HANDIHALER) 18 MCG INHALATION CAPSULE    Place 1 capsule (18 mcg total) into inhaler and inhale daily.   2.  The patient was instructed in symptomatic care and handouts were given. I suggest he followup with her primary care physician for ongoing refills on his meds. I refilled his Norvasc and albuterol and added Spiriva and a prednisone taper. 3.  The patient was told to return if becoming worse in any way, if no better in 3 or 4 days, and given some red flag symptoms that would indicate earlier return.     Reuben Likes, MD 08/13/12 [phone]

## 2013-02-10 ENCOUNTER — Emergency Department (INDEPENDENT_AMBULATORY_CARE_PROVIDER_SITE_OTHER)
Admission: EM | Admit: 2013-02-10 | Discharge: 2013-02-10 | Disposition: A | Discharge status: Home / Self Care | Payer: Self-pay | Source: Home / Self Care

## 2013-02-10 ENCOUNTER — Encounter (HOSPITAL_COMMUNITY): Payer: Self-pay | Admitting: *Deleted

## 2013-02-10 DIAGNOSIS — J45909 Unspecified asthma, uncomplicated: Secondary | ICD-10-CM

## 2013-02-10 DIAGNOSIS — J4520 Mild intermittent asthma, uncomplicated: Secondary | ICD-10-CM

## 2013-02-10 DIAGNOSIS — I10 Essential (primary) hypertension: Secondary | ICD-10-CM

## 2013-02-10 MED ORDER — AMLODIPINE BESYLATE 10 MG PO TABS: 10.0000 mg | ORAL_TABLET | Freq: Every day | ORAL | Status: DC

## 2013-02-10 MED ORDER — ALBUTEROL SULFATE HFA 108 (90 BASE) MCG/ACT IN AERS: 2.0000 | INHALATION_SPRAY | Freq: Four times a day (QID) | RESPIRATORY_TRACT | Status: DC | PRN

## 2013-02-10 NOTE — ED Notes (Signed)
States he ran out of his BP medicine today.  Also requests refill of his Albuteral.  Has not obtained a PCP yet. Pt. told about the [facility] and [employer].
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
## 2013-03-25 ENCOUNTER — Ambulatory Visit: Payer: BC Managed Care – PPO

## 2013-08-06 ENCOUNTER — Encounter (HOSPITAL_COMMUNITY): Payer: Self-pay | Admitting: Emergency Medicine

## 2013-08-06 ENCOUNTER — Emergency Department (HOSPITAL_COMMUNITY)
Admission: EM | Admit: 2013-08-06 | Discharge: 2013-08-06 | Disposition: A | Discharge status: Home / Self Care | Payer: BC Managed Care – PPO | Attending: Emergency Medicine | Admitting: Emergency Medicine

## 2013-08-06 DIAGNOSIS — R0789 Other chest pain: Secondary | ICD-10-CM | POA: Insufficient documentation

## 2013-08-06 DIAGNOSIS — J441 Chronic obstructive pulmonary disease with (acute) exacerbation: Secondary | ICD-10-CM | POA: Insufficient documentation

## 2013-08-06 DIAGNOSIS — IMO0002 Reserved for concepts with insufficient information to code with codable children: Secondary | ICD-10-CM | POA: Insufficient documentation

## 2013-08-06 DIAGNOSIS — Z79899 Other long term (current) drug therapy: Secondary | ICD-10-CM | POA: Insufficient documentation

## 2013-08-06 DIAGNOSIS — Z760 Encounter for issue of repeat prescription: Secondary | ICD-10-CM | POA: Insufficient documentation

## 2013-08-06 DIAGNOSIS — F17200 Nicotine dependence, unspecified, uncomplicated: Secondary | ICD-10-CM | POA: Insufficient documentation

## 2013-08-06 DIAGNOSIS — I10 Essential (primary) hypertension: Secondary | ICD-10-CM | POA: Insufficient documentation

## 2013-08-06 MED ORDER — TIOTROPIUM BROMIDE MONOHYDRATE 18 MCG IN CAPS: 18.0000 ug | ORAL_CAPSULE | Freq: Every day | RESPIRATORY_TRACT | Status: DC

## 2013-08-06 MED ORDER — ALBUTEROL SULFATE HFA 108 (90 BASE) MCG/ACT IN AERS
2.0000 | INHALATION_SPRAY | Freq: Once | RESPIRATORY_TRACT | Status: AC
  Administered 2013-08-06: 2 via RESPIRATORY_TRACT
  Filled 2013-08-06: qty 6.7

## 2013-08-06 MED ORDER — AMLODIPINE BESYLATE 10 MG PO TABS: 10.0000 mg | ORAL_TABLET | Freq: Every day | ORAL | Status: DC
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
## 2013-08-06 NOTE — ED Notes (Signed)
Pt is requesting a refill on inhaler and bp meds. States he has been out for 3 days. Denies any complaints

## 2013-08-16 NOTE — ED Provider Notes (Signed)
Medical screening examination/treatment/procedure(s) were performed by non-physician practitioner and as supervising physician I was immediately available for consultation/collaboration.  EKG Interpretation   None         Roney Marion, MD 08/16/13 2255

## 2013-11-03 ENCOUNTER — Encounter (HOSPITAL_COMMUNITY): Payer: Self-pay | Admitting: Emergency Medicine

## 2013-11-03 ENCOUNTER — Emergency Department (HOSPITAL_COMMUNITY)
Admission: EM | Admit: 2013-11-03 | Discharge: 2013-11-03 | Disposition: A | Discharge status: Home / Self Care | Payer: BC Managed Care – PPO | Attending: Emergency Medicine | Admitting: Emergency Medicine

## 2013-11-03 ENCOUNTER — Emergency Department (HOSPITAL_COMMUNITY): Payer: Self-pay

## 2013-11-03 ENCOUNTER — Emergency Department (HOSPITAL_COMMUNITY): Payer: BC Managed Care – PPO

## 2013-11-03 DIAGNOSIS — G629 Polyneuropathy, unspecified: Secondary | ICD-10-CM

## 2013-11-03 DIAGNOSIS — F17200 Nicotine dependence, unspecified, uncomplicated: Secondary | ICD-10-CM | POA: Insufficient documentation

## 2013-11-03 DIAGNOSIS — R42 Dizziness and giddiness: Secondary | ICD-10-CM | POA: Insufficient documentation

## 2013-11-03 DIAGNOSIS — I10 Essential (primary) hypertension: Secondary | ICD-10-CM | POA: Insufficient documentation

## 2013-11-03 DIAGNOSIS — H538 Other visual disturbances: Secondary | ICD-10-CM | POA: Insufficient documentation

## 2013-11-03 DIAGNOSIS — J441 Chronic obstructive pulmonary disease with (acute) exacerbation: Secondary | ICD-10-CM | POA: Insufficient documentation

## 2013-11-03 DIAGNOSIS — Z79899 Other long term (current) drug therapy: Secondary | ICD-10-CM | POA: Insufficient documentation

## 2013-11-03 DIAGNOSIS — G609 Hereditary and idiopathic neuropathy, unspecified: Secondary | ICD-10-CM | POA: Insufficient documentation

## 2013-11-03 DIAGNOSIS — J45901 Unspecified asthma with (acute) exacerbation: Secondary | ICD-10-CM

## 2013-11-03 DIAGNOSIS — R29818 Other symptoms and signs involving the nervous system: Secondary | ICD-10-CM | POA: Insufficient documentation

## 2013-11-03 LAB — COMPREHENSIVE METABOLIC PANEL
ALK PHOS: 50 U/L (ref 39–117)
ALT: 18 U/L (ref 0–53)
AST: 26 U/L (ref 0–37)
Albumin: 3.8 g/dL (ref 3.5–5.2)
BILIRUBIN TOTAL: 0.4 mg/dL (ref 0.3–1.2)
BUN: 19 mg/dL (ref 6–23)
CHLORIDE: 97 meq/L (ref 96–112)
CO2: 26 mEq/L (ref 19–32)
Calcium: 9.1 mg/dL (ref 8.4–10.5)
Creatinine, Ser: 1.38 mg/dL — ABNORMAL HIGH (ref 0.50–1.35)
GFR calc non Af Amer: 58 mL/min — ABNORMAL LOW (ref >90)
GFR, EST AFRICAN AMERICAN: 67 mL/min — AB (ref >90)
GLUCOSE: 91 mg/dL (ref 70–99)
POTASSIUM: 3.7 meq/L (ref 3.7–5.3)
Sodium: 138 mEq/L (ref 137–147)
Total Protein: 7.5 g/dL (ref 6.0–8.3)

## 2013-11-03 LAB — CBC WITH DIFFERENTIAL/PLATELET
Basophils Absolute: 0 10*3/uL (ref 0.0–0.1)
Basophils Relative: 0 % (ref 0–1)
EOS ABS: 0.1 10*3/uL (ref 0.0–0.7)
Eosinophils Relative: 1 % (ref 0–5)
HCT: 43.4 % (ref 39.0–52.0)
HEMOGLOBIN: 15.9 g/dL (ref 13.0–17.0)
LYMPHS ABS: 3.1 10*3/uL (ref 0.7–4.0)
Lymphocytes Relative: 30 % (ref 12–46)
MCH: 37.1 pg — AB (ref 26.0–34.0)
MCHC: 36.6 g/dL — ABNORMAL HIGH (ref 30.0–36.0)
MCV: 101.4 fL — ABNORMAL HIGH (ref 78.0–100.0)
MONOS PCT: 11 % (ref 3–12)
Monocytes Absolute: 1.2 10*3/uL — ABNORMAL HIGH (ref 0.1–1.0)
NEUTROS ABS: 6.1 10*3/uL (ref 1.7–7.7)
NEUTROS PCT: 58 % (ref 43–77)
Platelets: 214 10*3/uL (ref 150–400)
RBC: 4.28 MIL/uL (ref 4.22–5.81)
RDW: 13.5 % (ref 11.5–15.5)
WBC: 10.5 10*3/uL (ref 4.0–10.5)

## 2013-11-03 LAB — TROPONIN I

## 2013-11-03 MED ORDER — AMLODIPINE BESYLATE 10 MG PO TABS: 10.0000 mg | ORAL_TABLET | Freq: Every day | ORAL | Status: DC

## 2013-11-03 MED ORDER — ALBUTEROL SULFATE HFA 108 (90 BASE) MCG/ACT IN AERS: 2.0000 | INHALATION_SPRAY | RESPIRATORY_TRACT | Status: DC | PRN

## 2013-11-03 NOTE — ED Notes (Signed)
Patient states when he gets up from a sitting position to standing he becomes dizzy.

## 2013-11-03 NOTE — ED Notes (Signed)
Called MRI to get at a status update on the reading of patients MRI.

## 2013-11-03 NOTE — Discharge Instructions (Signed)
Pinched Nerve The term pinched nerve describes one type of damage or injury to a nerve or set of nerves. Pinched nerves can sometimes lead to other conditions. These include peripheral neuropathy, carpal tunnel syndrome, and tennis elbow. The extent of such injuries may vary from minor, temporary damage to a more permanent condition. Early diagnosis is important to prevent further damage or complications. Pinched nerve is a common cause of on-the-job injury. CAUSES  The injury may result from:  Compression.  Constriction.  Stretching. SYMPTOMS  Symptoms include:  Numbness.  "Pins and needles" or burning sensations.  Pain radiating outward from the injured area.  One of the most common examples of a single compressed nerve is the feeling of having a foot or hand "fall asleep." TREATMENT  The most often recommended treatment for pinched nerve is rest for the affected area. Corticosteroids help alleviate pain. In some cases, surgery is recommended. Physical therapy may be recommended. Splints or collars may be used. With treatment, most people recover from pinched nerve. In some cases, the damage is irreversible. Document Released: 08/09/2002 Document Revised: 11/11/2011 Document Reviewed: 07/27/2008 [facility] Patient Information 2014 [place], [place].  Peripheral Neuropathy Peripheral neuropathy is a type of nerve damage. It affects nerves that carry signals between the spinal cord and other parts of the body. These are called peripheral nerves. With peripheral neuropathy, one nerve or a group of nerves may be damaged.  CAUSES  Many things can damage peripheral nerves. For some people with peripheral neuropathy, the cause is unknown. Some causes include:  Diabetes. This is the most common cause of peripheral neuropathy.  Injury to a nerve.  Pressure or stress on a nerve that lasts a long time.  Too little vitamin B. Alcoholism can lead to this.  Infections.  Autoimmune diseases,  such as multiple sclerosis and systemic lupus erythematosus.  Inherited nerve diseases.  Some medicines, such as cancer drugs.  Toxic substances, such as lead and mercury.  Too little blood flowing to the legs.  Kidney disease.  Thyroid disease. SIGNS AND SYMPTOMS  Different people have different symptoms. The symptoms you have will depend on which of your nerves is damaged. Common symptoms include:  Loss of feeling (numbness) in the feet and hands.  Tingling in the feet and hands.  Pain that burns.  Very sensitive skin.  Weakness.  Not being able to move a part of the body (paralysis).  Muscle twitching.  Clumsiness or poor coordination.  Loss of balance.  Not being able to control your bladder.  Feeling dizzy.  Sexual problems. DIAGNOSIS  Peripheral neuropathy is a symptom, not a disease. Finding the cause of peripheral neuropathy can be hard. To figure that out, your health care provider will take a medical history and do a physical exam. A neurological exam will also be done. This involves checking things affected by your brain, spinal cord, and nerves (nervous system). For example, your health care provider will check your reflexes, how you move, and what you can feel.  Other types of tests may also be ordered, such as:  Blood tests.  A test of the fluid in your spinal cord.  Imaging tests, such as CT scans or an MRI.  Electromyography (EMG). This test checks the nerves that control muscles.  Nerve conduction velocity tests. These tests check how fast messages pass through your nerves.  Nerve biopsy. A small piece of nerve is removed. It is then checked under a microscope. TREATMENT   Medicine is often used to  treat peripheral neuropathy. Medicines may include:  Pain-relieving medicines. Prescription or over-the-counter medicine may be suggested.  Antiseizure medicine. This may be used for pain.  Antidepressants. These also may help ease pain from  neuropathy.  Lidocaine. This is a numbing medicine. You might wear a patch or be given a shot.  Mexiletine. This medicine is typically used to help control irregular heart rhythms.  Surgery. Surgery may be needed to relieve pressure on a nerve or to destroy a nerve that is causing pain.  Physical therapy to help movement.  Assistive devices to help movement. HOME CARE INSTRUCTIONS   Only take over-the-counter or prescription medicines as directed by your health care provider. Follow the instructions carefully for any given medicines. Do not take any other medicines without first getting approval from your health care provider.  If you have diabetes, work closely with your health care provider to keep your blood sugar under control.  If you have numbness in your feet:  Check every day for signs of injury or infection. Watch for redness, warmth, and swelling.  Wear padded socks and comfortable shoes. These help protect your feet.  Do not do things that put pressure on your damaged nerve.  Do not smoke. Smoking keeps blood from getting to damaged nerves.  Avoid or limit alcohol. Too much alcohol can cause a lack of B vitamins. These vitamins are needed for healthy nerves.  Develop a good support system. Coping with peripheral neuropathy can be stressful. Talk to a mental health specialist or join a support group if you are struggling.  Follow up with your health care provider as directed. SEEK MEDICAL CARE IF:   You have new signs or symptoms of peripheral neuropathy.  You are struggling emotionally from dealing with peripheral neuropathy.  You have a fever. SEEK IMMEDIATE MEDICAL CARE IF:   You have an injury or infection that is not healing.  You feel very dizzy or begin vomiting.  You have chest pain.  You have trouble breathing. Document Released: 08/09/2002 Document Revised: 05/01/2011 Document Reviewed: 04/26/2013 [facility] Patient Information 2014 [place].   Emergency Department Resource Guide 1) Find a Doctor and Pay Out of Pocket Although you won't have to find out who is covered by your insurance plan, it is a good idea to ask around and get recommendations. You will then need to call the office and see if the doctor you have chosen will accept you as a new patient and what types of options they offer for patients who are self-pay. Some doctors offer discounts or will set up payment plans for their patients who do not have insurance, but you will need to ask so you aren't surprised when you get to your appointment.  2) Contact Your Local Health Department Not all health departments have doctors that can see patients for sick visits, but many do, so it is worth a call to see if yours does. If you don't know where your local health department is, you can check in your phone book. The CDC also has a tool to help you locate your state's health department, and many state websites also have listings of all of their local health departments.  3) Find a [facility] If your illness is not likely to be very severe or complicated, you may want to try a walk in clinic. These are popping up all over the country in pharmacies, drugstores, and shopping centers. They're usually staffed by nurse practitioners or physician assistants that have  been trained to treat common illnesses and complaints. They're usually fairly quick and inexpensive. However, if you have serious medical issues or chronic medical problems, these are probably not your best option.  No Primary Care Doctor: - Call Health Connect at  [phone] - they can help you locate a primary care doctor that  accepts your insurance, provides certain services, etc. - Physician Referral Service- [phone]  Chronic Pain Problems: Organization         Address  Phone   Notes  [facility]  [phone] Patients need to be referred by their primary care doctor.   Medication  Assistance: Organization         Address  Phone   Notes  [facility] Medication [facility] [address] [place], [place] [address] [phone] --Must be a resident of [facility] -- Must have NO insurance coverage whatsoever (no Medicaid/ Medicare, etc.) -- The pt. MUST have a primary care doctor that directs their care regularly and follows them in the community   [facility]ist  [phone]   [employer]  [phone]    Agencies that provide inexpensive medical care: Organization         Address  Phone   Notes  [place]  [phone]   [facility]    [phone]   [facility] [place], [place] [address] [phone]   [place] [address] [place] [phone]   Planned Parenthood    ([phone]   [facility]    [phone]   [place] and [place] [address], [place] Phone:  [phone], Fax:  [phone] Hours of Operation:  9 am - 6 pm, M-F.  Also accepts Medicaid/Medicare and self-pay.  [facility] for [place] [facility] [place], [address], [place] Phone: [phone], Fax: [phone]. Hours of Operation:  8:30 am - 5:30 pm, M-F.  Also accepts Medicaid and self-pay.  [facility] [place] [address], [place] Phone: [phone]   [place], [place], [place] [phone], Ext. [phone] Mondays & Thursdays: 7-9 AM.  First 15 patients are seen on a first come, first serve basis.    [place] Providers:  Organization         Address  Phone   Notes  [facility] [address], [place] [phone] Also accepts self-pay patients.  [facility] [address], [place]  [phone]   [place], [address], [place]  [phone]   [facility] [address], [place] [phone]   Lucianne Lei [address], [place]   [phone] Only accepts [place] Access [place] patients after they have their name applied to their card.   Self-Pay (no insurance) in [facility]:  Organization         Address  Phone   Notes  Sickle Cell Patients, [facility] [place] [phone]   [facility] Urgent Care [place] [phone]   [facility]  [place], [address], [place] [phone]   [facility]/Dr. Osei-Bonsu  [address] or  [place], [address], [place] [phone] Phone number for both [facility] and [place] locations is the same.  Urgent Medical and [facility] [address], [place] [phone]   [facility] [address], [place] or [address] [phone]   [facility] [address] [place] [phone], phone; [phone], fax Sees patients 1st and 3rd Saturday of every month.  Must not qualify for public or private insurance (i.e. Medicaid, Medicare, [place] Health Choice, Veterans' Benefits)  Household income should be no more than 200% of the poverty level The clinic cannot treat you if you are pregnant or think you are pregnant  Sexually transmitted diseases are not treated at the clinic.    Dental Care: Organization         Address  Phone  Notes  [facility] Department of [facility] [place] [phone] Accepts children up to age 37 who are enrolled in [place] or [place]; pregnant women with a Medicaid card; and children who have applied for Medicaid or [place] Health Choice, but were declined, whose parents can pay a reduced fee at time of service.  [facility]  Department of [facility]  [address], [place] [phone] Accepts children up to age 25 who are enrolled in [place] or [place]; pregnant women with a Medicaid card; and children who have applied for Medicaid or [place] Health Choice, but were declined, whose parents can pay a reduced fee at time of service.  [place] Adult Dental Access PROGRAM  [place] [phone] Patients are seen by appointment only. Walk-ins are not accepted. [place] will see patients 14 years of age and older. Monday - Tuesday (8am-5pm) Most Wednesdays (8:30-5pm) $30 per visit, cash only  [facility] Adult Dental Access PROGRAM  [address], [facility] [phone] Patients are seen by appointment only. Walk-ins are not accepted. [place] will see patients 2 years of age and older. One Wednesday Evening (Monthly: Volunteer Based).  $30 per visit, cash only  [place]  [phone] for adults; Children under age 9, call Graduate Pediatric Dentistry at [phone]. Children aged 38-14, please call [phone] to request a pediatric application.  Dental services are provided in all areas of dental care including fillings, crowns and bridges, complete and partial dentures, implants, gum treatment, root canals, and extractions. Preventive care is also provided. Treatment is provided to both adults and children. Patients are selected via a lottery and there is often a waiting list.   [facility] [address], [place]  [phone] [email or web address]   [facility] [address] [place], [place] [phone], Ext. [phone] Second and Fourth Thursday of each month, opens at 6:30 AM; Clinic ends at 9 AM.  Patients are seen on a first-come first-served basis, and a limited number are seen during each clinic.   [facility]  [address] [place], [place] [phone]    Eligibility Requirements You must have lived in [place], [place], or [place] counties for at least the last three months.   You cannot be eligible for state or federal sponsored [employer], including [employer], [place], or [employer].   You generally cannot be eligible for healthcare insurance through your employer.    How to apply: Eligibility screenings  are held every Tuesday and Wednesday afternoon from 1:00 pm until 4:00 pm. You do not need an appointment for the interview!  [facility] [address], [place], [place]   [place]  [place] Department  [place]  [phone]    Behavioral Health Resources in the Community: Intensive Outpatient Programs Organization         Address  Phone  Notes  Lovilia [place]. [address], [place], [place] [phone]   [facility] Outpatient [address], [place], [place]   ADS: Alcohol & Drug S[facility]cs [address] [place], [place]   [place] [address]  [place], [place] or [phone]   Substance Abuse Resources Organization         Address  Phone  Notes  Alcohol and Drug Services  [phone]   [place]  [phone]   [facility] [place]   Chinita Pester  [phone]   Residential & Outpatient Substance Abuse Program  [phone]   Psychological Services Organization         Address  Phone  Notes  [facility] [place]  [place]  [phone]   [place] [address], [place] or [phone]    Mobile Crisis Teams Organization         Address  Phone  Notes  [facility], Mobile Crisis Care Unit  [phone]   [facility]  [address]  [place], [place]   [place] Levels [address], [place] [phone]    Self-Help/Support Groups Organization         Address  Phone             Notes  [place]. of [place] - variety of support groups  [place] Call for more information  Narcotics Anonymous (NA), [facility] [address], [employer]   2 meetings at this location   Special educational needs teacher         Address  Phone  Notes  ASAP Residential Treatment [place],    [place]  [phone]   [facility]  [address], [place] [phone], [place], [place]   [place], [place] [phone] Admissions: 8am-3pm M-F  Incentives Substance [place] [address],    [place], [place] [phone]   [facility] [address] [place], [place], [place]   [facility] [address]  [place], [place]   [facility]ight Programs - Intensive Outpatient [address], [place], [place]   [facility] ([place] [place].,  [place], [place] [phone] or [phone]   [facility] Treatment Services (RTS) [address] [place], [place] Accepts Medicaid  [facility] [place] [address],  [place] [phone] Substance Abuse/Addiction Treatment   [facility] Organization         Address  Phone  Notes  [facility]  [phone]   Domenic Schwab, PhD [address] [place], [place]   [phone] or [phone]   [place]   [address] [place], [place] [phone]   [facility] [place] [address] [place], [place] [phone] Insurance/Medicaid/sponsorship  through [facility] and Families [address], Ste [place], [place] [phone] [place], [place] [phone]    Dr. Adele Schilder  [phone]   [facility] [place] Dept. 1) [address] [place] 2) [place] 3)  [place] [address], [place] [phone]  [phone]   [place] [phone] or [phone] (After Hours)
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
## 2013-11-03 NOTE — ED Notes (Signed)
Left hand and arm numbness since yesterday feels dizzy thinks bp is up ran out of meds sunday

## 2015-01-24 ENCOUNTER — Emergency Department (HOSPITAL_COMMUNITY): Payer: Self-pay

## 2015-01-24 ENCOUNTER — Emergency Department (HOSPITAL_COMMUNITY)
Admission: EM | Admit: 2015-01-24 | Discharge: 2015-01-24 | Disposition: A | Discharge status: Home / Self Care | Payer: Self-pay | Attending: Emergency Medicine | Admitting: Emergency Medicine

## 2015-01-24 ENCOUNTER — Encounter (HOSPITAL_COMMUNITY): Payer: Self-pay | Admitting: Cardiology

## 2015-01-24 DIAGNOSIS — Z79899 Other long term (current) drug therapy: Secondary | ICD-10-CM | POA: Insufficient documentation

## 2015-01-24 DIAGNOSIS — R42 Dizziness and giddiness: Secondary | ICD-10-CM | POA: Insufficient documentation

## 2015-01-24 DIAGNOSIS — Z720 Tobacco use: Secondary | ICD-10-CM | POA: Insufficient documentation

## 2015-01-24 DIAGNOSIS — Z87828 Personal history of other (healed) physical injury and trauma: Secondary | ICD-10-CM | POA: Insufficient documentation

## 2015-01-24 DIAGNOSIS — R519 Headache, unspecified: Secondary | ICD-10-CM

## 2015-01-24 DIAGNOSIS — I10 Essential (primary) hypertension: Secondary | ICD-10-CM

## 2015-01-24 DIAGNOSIS — H539 Unspecified visual disturbance: Secondary | ICD-10-CM | POA: Insufficient documentation

## 2015-01-24 DIAGNOSIS — R51 Headache: Secondary | ICD-10-CM | POA: Insufficient documentation

## 2015-01-24 DIAGNOSIS — J449 Chronic obstructive pulmonary disease, unspecified: Secondary | ICD-10-CM | POA: Insufficient documentation

## 2015-01-24 LAB — I-STAT CHEM 8, ED
BUN: 14 mg/dL (ref 6–20)
Calcium, Ion: 1.2 mmol/L (ref 1.12–1.23)
Chloride: 105 mmol/L (ref 101–111)
Creatinine, Ser: 1 mg/dL (ref 0.61–1.24)
GLUCOSE: 100 mg/dL — AB (ref 65–99)
HEMATOCRIT: 48 % (ref 39.0–52.0)
Hemoglobin: 16.3 g/dL (ref 13.0–17.0)
Potassium: 4.6 mmol/L (ref 3.5–5.1)
SODIUM: 142 mmol/L (ref 135–145)
TCO2: 22 mmol/L (ref 0–100)

## 2015-01-24 LAB — CBC WITH DIFFERENTIAL/PLATELET
BASOS PCT: 0 % (ref 0–1)
Basophils Absolute: 0 10*3/uL (ref 0.0–0.1)
EOS ABS: 0.1 10*3/uL (ref 0.0–0.7)
Eosinophils Relative: 2 % (ref 0–5)
HCT: 42.3 % (ref 39.0–52.0)
Hemoglobin: 14.6 g/dL (ref 13.0–17.0)
Lymphocytes Relative: 29 % (ref 12–46)
Lymphs Abs: 2.2 10*3/uL (ref 0.7–4.0)
MCH: 35.8 pg — ABNORMAL HIGH (ref 26.0–34.0)
MCHC: 34.5 g/dL (ref 30.0–36.0)
MCV: 103.7 fL — AB (ref 78.0–100.0)
MONO ABS: 0.5 10*3/uL (ref 0.1–1.0)
Monocytes Relative: 7 % (ref 3–12)
NEUTROS PCT: 62 % (ref 43–77)
Neutro Abs: 4.5 10*3/uL (ref 1.7–7.7)
Platelets: 130 10*3/uL — ABNORMAL LOW (ref 150–400)
RBC: 4.08 MIL/uL — AB (ref 4.22–5.81)
RDW: 13.1 % (ref 11.5–15.5)
WBC: 7.4 10*3/uL (ref 4.0–10.5)

## 2015-01-24 LAB — I-STAT TROPONIN, ED: TROPONIN I, POC: 0 ng/mL (ref 0.00–0.08)

## 2015-01-24 MED ORDER — HYDROCHLOROTHIAZIDE 25 MG PO TABS
25.0000 mg | ORAL_TABLET | Freq: Once | ORAL | Status: AC
  Administered 2015-01-24: 25 mg via ORAL
  Filled 2015-01-24: qty 1

## 2015-01-24 MED ORDER — ALBUTEROL SULFATE HFA 108 (90 BASE) MCG/ACT IN AERS
2.0000 | INHALATION_SPRAY | RESPIRATORY_TRACT | Status: DC
  Administered 2015-01-24: 2 via RESPIRATORY_TRACT
  Filled 2015-01-24: qty 6.7

## 2015-01-24 MED ORDER — AMLODIPINE BESYLATE 5 MG PO TABS
10.0000 mg | ORAL_TABLET | Freq: Once | ORAL | Status: AC
  Administered 2015-01-24: 10 mg via ORAL
  Filled 2015-01-24: qty 2

## 2015-01-24 MED ORDER — HYDRALAZINE HCL 20 MG/ML IJ SOLN
10.0000 mg | Freq: Once | INTRAMUSCULAR | Status: AC
  Administered 2015-01-24: 10 mg via INTRAVENOUS
  Filled 2015-01-24: qty 1

## 2015-01-24 MED ORDER — LABETALOL HCL 5 MG/ML IV SOLN
10.0000 mg | Freq: Once | INTRAVENOUS | Status: AC
  Administered 2015-01-24: 10 mg via INTRAVENOUS
  Filled 2015-01-24: qty 4

## 2015-01-24 MED ORDER — LISINOPRIL 10 MG PO TABS: 10.0000 mg | ORAL_TABLET | Freq: Every day | ORAL | Status: DC

## 2015-01-24 MED ORDER — HYDROCHLOROTHIAZIDE 25 MG PO TABS: 25.0000 mg | ORAL_TABLET | Freq: Every day | ORAL | Status: DC

## 2015-01-24 NOTE — Discharge Instructions (Signed)
Hypertension °Hypertension, commonly called high blood pressure, is when the force of blood pumping through your arteries is too strong. Your arteries are the blood vessels that carry blood from your heart throughout your body. A blood pressure reading consists of a higher number over a lower number, such as 110/72. The higher number (systolic) is the pressure inside your arteries when your heart pumps. The lower number (diastolic) is the pressure inside your arteries when your heart relaxes. Ideally you want your blood pressure below 120/80. °Hypertension forces your heart to work harder to pump blood. Your arteries may become narrow or stiff. Having hypertension puts you at risk for heart disease, stroke, and other problems.  °RISK FACTORS °Some risk factors for high blood pressure are controllable. Others are not.  °Risk factors you cannot control include:  °· Race. You may be at higher risk if you are African American. °· Age. Risk increases with age. °· Gender. Men are at higher risk than women before age 45 years. After age 65, women are at higher risk than men. °Risk factors you can control include: °· Not getting enough exercise or physical activity. °· Being overweight. °· Getting too much fat, sugar, calories, or salt in your diet. °· Drinking too much alcohol. °SIGNS AND SYMPTOMS °Hypertension does not usually cause signs or symptoms. Extremely high blood pressure (hypertensive crisis) may cause headache, anxiety, shortness of breath, and nosebleed. °DIAGNOSIS  °To check if you have hypertension, your health care provider will measure your blood pressure while you are seated, with your arm held at the level of your heart. It should be measured at least twice using the same arm. Certain conditions can cause a difference in blood pressure between your right and left arms. A blood pressure reading that is higher than normal on one occasion does not mean that you need treatment. If one blood pressure reading  is high, ask your health care provider about having it checked again. °TREATMENT  °Treating high blood pressure includes making lifestyle changes and possibly taking medicine. Living a healthy lifestyle can help lower high blood pressure. You may need to change some of your habits. °Lifestyle changes may include: °· Following the DASH diet. This diet is high in fruits, vegetables, and whole grains. It is low in salt, red meat, and added sugars. °· Getting at least 2½ hours of brisk physical activity every week. °· Losing weight if necessary. °· Not smoking. °· Limiting alcoholic beverages. °· Learning ways to reduce stress. ° If lifestyle changes are not enough to get your blood pressure under control, your health care provider may prescribe medicine. You may need to take more than one. Work closely with your health care provider to understand the risks and benefits. °HOME CARE INSTRUCTIONS °· Have your blood pressure rechecked as directed by your health care provider.   °· Take medicines only as directed by your health care provider. Follow the directions carefully. Blood pressure medicines must be taken as prescribed. The medicine does not work as well when you skip doses. Skipping doses also puts you at risk for problems.   °· Do not smoke.   °· Monitor your blood pressure at home as directed by your health care provider.  °SEEK MEDICAL CARE IF:  °· You think you are having a reaction to medicines taken. °· You have recurrent headaches or feel dizzy. °· You have swelling in your ankles. °· You have trouble with your vision. °SEEK IMMEDIATE MEDICAL CARE IF: °· You develop a severe headache or confusion. °·   You have unusual weakness, numbness, or feel faint.  You have severe chest or abdominal pain.  You vomit repeatedly.  You have trouble breathing. MAKE SURE YOU:   Understand these instructions.  Will watch your condition.  Will get help right away if you are not doing well or get worse. Document  Released: 08/19/2005 Document Revised: 01/03/2014 Document Reviewed: 06/11/2013 [facility] Patient Information 2015 [place], [place]. This information is not intended to replace advice given to you by your health care provider. Make sure you discuss any questions you have with your health care provider. General Headache Without Cause A headache is pain or discomfort felt around the head or neck area. The specific cause of a headache may not be found. There are many causes and types of headaches. A few common ones are:  Tension headaches.  Migraine headaches.  Cluster headaches.  Chronic daily headaches. HOME CARE INSTRUCTIONS   Keep all follow-up appointments with your caregiver or any specialist referral.  Only take over-the-counter or prescription medicines for pain or discomfort as directed by your caregiver.  Lie down in a dark, quiet room when you have a headache.  Keep a headache journal to find out what may trigger your migraine headaches. For example, write down:  What you eat and drink.  How much sleep you get.  Any change to your diet or medicines.  Try massage or other relaxation techniques.  Put ice packs or heat on the head and neck. Use these 3 to 4 times per day for 15 to 20 minutes each time, or as needed.  Limit stress.  Sit up straight, and do not tense your muscles.  Quit smoking if you smoke.  Limit alcohol use.  Decrease the amount of caffeine you drink, or stop drinking caffeine.  Eat and sleep on a regular schedule.  Get 7 to 9 hours of sleep, or as recommended by your caregiver.  Keep lights dim if bright lights bother you and make your headaches worse. SEEK MEDICAL CARE IF:   You have problems with the medicines you were prescribed.  Your medicines are not working.  You have a change from the usual headache.  You have nausea or vomiting. SEEK IMMEDIATE MEDICAL CARE IF:   Your headache becomes severe.  You have a fever.  You have a  stiff neck.  You have loss of vision.  You have muscular weakness or loss of muscle control.  You start losing your balance or have trouble walking.  You feel faint or pass out.  You have severe symptoms that are different from your first symptoms. MAKE SURE YOU:   Understand these instructions.  Will watch your condition.  Will get help right away if you are not doing well or get worse. Document Released: 08/19/2005 Document Revised: 11/11/2011 Document Reviewed: 09/04/2011 [facility] Patient Information 2015 [place], [place]. This information is not intended to replace advice given to you by your health care provider. Make sure you discuss any questions you have with your health care provider. DASH Eating Plan DASH stands for "Dietary Approaches to Stop Hypertension." The DASH eating plan is a healthy eating plan that has been shown to reduce high blood pressure (hypertension). Additional health benefits may include reducing the risk of type 2 diabetes mellitus, heart disease, and stroke. The DASH eating plan may also help with weight loss. WHAT DO I NEED TO KNOW ABOUT THE DASH EATING PLAN? For the DASH eating plan, you will follow these general guidelines:  Choose foods with  a percent daily value for sodium of less than 5% (as listed on the food label).  Use salt-free seasonings or herbs instead of table salt or sea salt.  Check with your health care provider or pharmacist before using salt substitutes.  Eat lower-sodium products, often labeled as "lower sodium" or "no salt added."  Eat fresh foods.  Eat more vegetables, fruits, and low-fat dairy products.  Choose whole grains. Look for the word "whole" as the first word in the ingredient list.  Choose fish and skinless chicken or [place] more often than red meat. Limit fish, poultry, and meat to 6 oz (170 g) each day.  Limit sweets, desserts, sugars, and sugary drinks.  Choose heart-healthy fats.  Limit cheese to 1 oz (28  g) per day.  Eat more home-cooked food and less restaurant, buffet, and fast food.  Limit fried foods.  Cook foods using methods other than frying.  Limit canned vegetables. If you do use them, rinse them well to decrease the sodium.  When eating at a restaurant, ask that your food be prepared with less salt, or no salt if possible. WHAT FOODS CAN I EAT? Seek help from a dietitian for individual calorie needs. Grains Whole grain or whole wheat bread. Brown rice. Whole grain or whole wheat pasta. Quinoa, bulgur, and whole grain cereals. Low-sodium cereals. Corn or whole wheat flour tortillas. Whole grain cornbread. Whole grain crackers. Low-sodium crackers. Vegetables Fresh or frozen vegetables (raw, steamed, roasted, or grilled). Low-sodium or reduced-sodium tomato and vegetable juices. Low-sodium or reduced-sodium tomato sauce and paste. Low-sodium or reduced-sodium canned vegetables.  Fruits All fresh, canned (in natural juice), or frozen fruits. Meat and Other Protein Products Ground beef (85% or leaner), grass-fed beef, or beef trimmed of fat. Skinless chicken or [place]. Ground chicken or [place]. Pork trimmed of fat. All fish and seafood. Eggs. Dried beans, peas, or lentils. Unsalted nuts and seeds. Unsalted canned beans. Dairy Low-fat dairy products, such as skim or 1% milk, 2% or reduced-fat cheeses, low-fat ricotta or cottage cheese, or plain low-fat yogurt. Low-sodium or reduced-sodium cheeses. Fats and Oils Tub margarines without trans fats. Light or reduced-fat mayonnaise and salad dressings (reduced sodium). Avocado. Safflower, olive, or canola oils. Natural peanut or almond butter. Other Unsalted popcorn and pretzels. The items listed above may not be a complete list of recommended foods or beverages. Contact your dietitian for more options. WHAT FOODS ARE NOT RECOMMENDED? Grains White bread. White pasta. White rice. Refined cornbread. Bagels and croissants. Crackers that  contain trans fat. Vegetables Creamed or fried vegetables. Vegetables in a cheese sauce. Regular canned vegetables. Regular canned tomato sauce and paste. Regular tomato and vegetable juices. Fruits Dried fruits. Canned fruit in light or heavy syrup. Fruit juice. Meat and Other Protein Products Fatty cuts of meat. Ribs, chicken wings, bacon, sausage, bologna, salami, chitterlings, fatback, hot dogs, bratwurst, and packaged luncheon meats. Salted nuts and seeds. Canned beans with salt. Dairy Whole or 2% milk, cream, half-and-half, and cream cheese. Whole-fat or sweetened yogurt. Full-fat cheeses or blue cheese. Nondairy creamers and whipped toppings. Processed cheese, cheese spreads, or cheese curds. Condiments Onion and garlic salt, seasoned salt, table salt, and sea salt. Canned and packaged gravies. Worcestershire sauce. Tartar sauce. Barbecue sauce. Teriyaki sauce. Soy sauce, including reduced sodium. Steak sauce. Fish sauce. Oyster sauce. Cocktail sauce. Horseradish. Ketchup and mustard. Meat flavorings and tenderizers. Bouillon cubes. Hot sauce. Tabasco sauce. Marinades. Taco seasonings. Relishes. Fats and Oils Butter, stick margarine, lard, shortening, ghee, and bacon  fat. Coconut, palm kernel, or palm oils. Regular salad dressings. Other Pickles and olives. Salted popcorn and pretzels. The items listed above may not be a complete list of foods and beverages to avoid. Contact your dietitian for more information. WHERE CAN I FIND MORE INFORMATION? National Heart, [employer], [employer] Institute: travelstabloid.com Document Released: 08/08/2011 Document Revised: 01/03/2014 Document Reviewed: 06/23/2013 [facility] Patient Information 2015 [place], [place]. This information is not intended to replace advice given to you by your health care provider. Make sure you discuss any questions you have with your health care provider.

## 2015-01-24 NOTE — ED Notes (Signed)
Pt reports that he has been out of his blood pressure medication for the past 2 days. Reports he woke up with a headache this morning and some blurred vision.

## 2015-01-24 NOTE — ED Notes (Signed)
Will, PA at the bedside.

## 2015-01-24 NOTE — ED Notes (Signed)
Lab at the bedside 
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
## 2015-07-03 ENCOUNTER — Emergency Department (HOSPITAL_COMMUNITY)
Admission: EM | Admit: 2015-07-03 | Discharge: 2015-07-03 | Disposition: A | Discharge status: Home / Self Care | Payer: PRIVATE HEALTH INSURANCE | Attending: Emergency Medicine | Admitting: Emergency Medicine

## 2015-07-03 DIAGNOSIS — Z720 Tobacco use: Secondary | ICD-10-CM | POA: Insufficient documentation

## 2015-07-03 DIAGNOSIS — H539 Unspecified visual disturbance: Secondary | ICD-10-CM | POA: Diagnosis not present

## 2015-07-03 DIAGNOSIS — I10 Essential (primary) hypertension: Secondary | ICD-10-CM | POA: Diagnosis not present

## 2015-07-03 DIAGNOSIS — R51 Headache: Secondary | ICD-10-CM | POA: Diagnosis present

## 2015-07-03 DIAGNOSIS — Z760 Encounter for issue of repeat prescription: Secondary | ICD-10-CM | POA: Diagnosis not present

## 2015-07-03 DIAGNOSIS — Z79899 Other long term (current) drug therapy: Secondary | ICD-10-CM | POA: Diagnosis not present

## 2015-07-03 DIAGNOSIS — R519 Headache, unspecified: Secondary | ICD-10-CM

## 2015-07-03 DIAGNOSIS — J449 Chronic obstructive pulmonary disease, unspecified: Secondary | ICD-10-CM | POA: Insufficient documentation

## 2015-07-03 LAB — BASIC METABOLIC PANEL
Anion gap: 5 (ref 5–15)
BUN: 10 mg/dL (ref 6–20)
CALCIUM: 8.9 mg/dL (ref 8.9–10.3)
CHLORIDE: 105 mmol/L (ref 101–111)
CO2: 26 mmol/L (ref 22–32)
CREATININE: 0.95 mg/dL (ref 0.61–1.24)
GFR calc Af Amer: >60 mL/min (ref >60)
GFR calc non Af Amer: >60 mL/min (ref >60)
GLUCOSE: 109 mg/dL — AB (ref 65–99)
Potassium: 4.2 mmol/L (ref 3.5–5.1)
Sodium: 136 mmol/L (ref 135–145)

## 2015-07-03 MED ORDER — LISINOPRIL 10 MG PO TABS
10.0000 mg | ORAL_TABLET | Freq: Once | ORAL | Status: AC
  Administered 2015-07-03: 10 mg via ORAL
  Filled 2015-07-03: qty 1

## 2015-07-03 MED ORDER — LISINOPRIL 20 MG PO TABS: 20.0000 mg | ORAL_TABLET | Freq: Every day | ORAL | Status: DC

## 2015-07-03 MED ORDER — IBUPROFEN 800 MG PO TABS
800.0000 mg | ORAL_TABLET | Freq: Once | ORAL | Status: AC
  Administered 2015-07-03: 800 mg via ORAL
  Filled 2015-07-03: qty 1

## 2015-07-03 MED ORDER — LISINOPRIL 20 MG PO TABS: 10.0000 mg | ORAL_TABLET | Freq: Every day | ORAL | Status: DC

## 2015-07-03 NOTE — ED Notes (Addendum)
Pt reports he has HTN, does not have a pcp, was given a prescription for BP meds 1 month ago. Pt was taking norvasc, at ED visit pt med was switched to lisinopril. Pt reports he has been out of medication for 3 days. Reports slight headache, pain 5/10.  Pt unsure why his BP med was switched. Denies SOB or chest pain.   rn explained that pt needs to be followed by a pcp and that provider in ED may not write for another prescription.

## 2015-07-03 NOTE — Discharge Instructions (Signed)
Medicine Refill at the Emergency Department Follow-up with a primary care provider using the resource guide below for future blood pressure refills and blood pressure rechecks. We have refilled your medicine today, but it is best for you to get refills through your primary health care provider's office. In the future, please plan ahead so you do not need to get refills from the emergency department. If the medicine we refilled was a maintenance medicine, you may have received only enough to get you by until you are able to see your regular health care provider.   This information is not intended to replace advice given to you by your health care provider. Make sure you discuss any questions you have with your health care provider.   Document Released: 12/06/2003 Document Revised: 09/09/2014 Document Reviewed: 11/26/2013 Elsevier Interactive Patient Education 2016 [employer].  Emergency Department Resource Guide 1) Find a Doctor and Pay Out of Pocket Although you won't have to find out who is covered by your insurance plan, it is a good idea to ask around and get recommendations. You will then need to call the office and see if the doctor you have chosen will accept you as a new patient and what types of options they offer for patients who are self-pay. Some doctors offer discounts or will set up payment plans for their patients who do not have insurance, but you will need to ask so you aren't surprised when you get to your appointment.  2) Contact Your Local Health Department Not all health departments have doctors that can see patients for sick visits, but many do, so it is worth a call to see if yours does. If you don't know where your local health department is, you can check in your phone book. The CDC also has a tool to help you locate your state's health department, and many state websites also have listings of all of their local health departments.  3) Find a [facility] If your  illness is not likely to be very severe or complicated, you may want to try a walk in clinic. These are popping up all over the country in pharmacies, drugstores, and shopping centers. They're usually staffed by nurse practitioners or physician assistants that have been trained to treat common illnesses and complaints. They're usually fairly quick and inexpensive. However, if you have serious medical issues or chronic medical problems, these are probably not your best option.  No Primary Care Doctor: - Call Health Connect at  [phone] - they can help you locate a primary care doctor that  accepts your insurance, provides certain services, etc. - Physician Referral Service- [phone]  Chronic Pain Problems: Organization         Address  Phone   Notes  [facility]  [phone] Patients need to be referred by their primary care doctor.   Medication Assistance: Organization         Address  Phone   Notes  [facility] Medication [facility] [address] [place], [place] [address] [phone] --Must be a resident of [facility] -- Must have NO insurance coverage whatsoever (no Medicaid/ Medicare, etc.) -- The pt. MUST have a primary care doctor that directs their care regularly and follows them in the community   MedAssist  [phone]   [employer]  [phone]    Agencies that provide inexpensive medical care: Patent attorney  Notes  [place]  [phone]   [facility]    [phone]   [facility] [place], [place] [address] [phone]   [place] [address] [place] [phone]   Planned Parenthood    [phone]   [facility]    [phone]   [place] and [place] [address], [place] Phone:  [phone], Fax:  [phone] Hours of Operation:   9 am - 6 pm, M-F.  Also accepts Medicaid/Medicare and self-pay.  [facility] [place], [address], [place] Phone: [phone], Fax: [phone]. Hours of Operation:  8:30 am - 5:30 pm, M-F.  Also accepts Medicaid and self-pay.  [facility] [address] [place] Phone: [phone]   [place], [place], [place] [phone], Ext. [phone] Mondays & Thursdays: 7-9 AM.  First 15 patients are seen on a first come, first serve basis.    [place] Providers:  Organization         Address  Phone   Notes  [facility] [address], [place] [phone] Also accepts self-pay patients.  [facility] [address], [place]  [phone]   [place], [address], [place] [phone]   [facility] [address] [place] [phone]   Lucianne Lei [address], [place]   [phone] Only accepts [place] Access [place] patients after they have their name applied to their card.   Self-Pay (no insurance) in [facility]:  Organization         Address  Phone   Notes  Sickle Cell Patients, [facility] [place] [phone]   [facility] [place] [phone]   [facility] [place]  [place], [place], [place] [phone]   [facility]/Dr. Osei-Bonsu  [address], [place] or [address], [place] [phone] Phone number for both [place] and [place] locations is the same.  Urgent Medical and [facility] [address] [place] [phone]   [facility] [address], [place] or [address] [phone]   [facility] [address], [place] [phone], phone; [phone], fax Sees patients 1st and 3rd Saturday of every month.  Must not qualify for public or private insurance (i.e. Medicaid, Medicare, [place] Health Choice, Veterans' Benefits)  Household income should be no more than 200% of the poverty level The clinic cannot treat you if you are pregnant or think you are pregnant  Sexually transmitted diseases are not treated at the clinic.    Dental Care: Organization         Address  Phone  Notes  [facility] Department of [facility] [place] [phone] Accepts children up to age 110 who are enrolled in [place] or [place]; pregnant women with a Medicaid card; and children who have applied for Medicaid or [place] Health Choice, but were declined, whose parents can pay a reduced fee at time of service.  [facility]  Department of [facility]  [address], [place] [phone] Accepts children up to age 58 who are enrolled in [place] or [place]; pregnant women with a Medicaid card; and children who have applied for Medicaid or [place] Health Choice, but were declined, whose parents can pay a reduced fee at time of service.  [place] Adult [facility] Access PROGRAM  [place] [phone] Patients are seen by appointment only. Walk-ins are not accepted. [place] will see patients 68 years of age and older. Monday - Tuesday (8am-5pm) Most Wednesdays (8:30-5pm) $30 per visit, cash only  [facility] Adult Dental Access PROGRAM  [address], [facility] [phone] Patients are seen by appointment only. Walk-ins are not accepted. [place] will see patients 17 years of age and older. One Wednesday Evening (Monthly: Volunteer Based).  $30 per visit, cash only  [place]  [phone] for adults; Children under age 50, call Graduate Pediatric Dentistry at  [phone]. Children aged 14-14, please call [phone] to request a pediatric application.  Dental services are provided in all areas of dental care including fillings, crowns and bridges, complete and partial dentures, implants, gum treatment, root canals, and extractions. Preventive care is also provided. Treatment is provided to both adults and children. Patients are selected via a lottery and there is often a waiting list.   [facility] [address] [place]  [phone] [email or web address]   [facility] [address] [place], [place] [phone], Ext. [phone] Second and Fourth Thursday of each month, opens at 6:30 AM; Clinic ends at 9 AM.  Patients are seen on a first-come first-served basis, and a limited number are seen during each clinic.   [facility]  [address] Hillard Danker [place], [place] [phone]   Eligibility Requirements You must have lived in [place], [place], or [place] counties for at least the last three months.   You cannot be eligible for state or federal sponsored [employer], including [employer], [place], or [employer].   You generally cannot be eligible for healthcare insurance through your employer.    How to apply: Eligibility screenings are held every Tuesday and Wednesday afternoon from 1:00 pm until 4:00 pm. You do not need an appointment for the interview!  [facility] [address] [place], [place]   [place]  [place] Department  [place]  [phone]    Behavioral Health Resources in the Community: Intensive Outpatient Programs Organization         Address  Phone  Notes  [place]. [address] [place], [place] [phone]   [facility] Outpatient [address], [place], [place]   ADS: Alcohol &  Drug Svcs [address] [place], [place]   [place] [address],  [employer], [place] or [phone]   Substance Abuse Resources Organization         Address  Phone  Notes  Alcohol and Drug Services  [phone]   [place]  [phone]   [facility] [place]   Chinita Pester  [phone]   Residential & Outpatient Substance Abuse Program  [phone]   Psychological Services Organization         Address  Phone  Notes  [facility]  [place]   [place] [address], [place] or [phone]    Mobile Crisis Teams Organization         Address  Phone  Notes  [facility], Mobile Crisis Care Unit  [phone]   [facility]  [address] [place], [place]   [place] [address] [place] [phone]    Self-Help/Support Groups Organization         Address  Phone             Notes  [place]. of [place] - variety of support groups  [place] Call for more information  Narcotics Anonymous (NA), [facility] [address], [employer] [place]  2 meetings at this location   Special educational needs teacher         Address  Phone  Notes  ASAP Residential Treatment [place],    [place]  [phone]   [facility]  [address] [place] [phone], [place], [place]   [place], [place] [phone] Admissions: 8am-3pm M-F  Incentives Substance [place] [address],    [place], [place] [phone]   [facility] [address] [place], [place], [place]   [facility] [address]  [place], [place]   [facility] - Intensive Outpatient [address], [place], [place]   [facility] ([place].) [place].,  [place], [place] [phone] or [phone]   [facility] Services (RT[facility]) [address], [place], [place] Accepts Medicaid  [facility] [place] [address],  [place] [phone] Substance Abuse/Addiction Treatment   [facility] Organization         Address  Phone  Notes  [facility]  [phone]   Domenic Schwab, PhD [address] Arlis Porta [place], [place]   [phone] or [phone]   [place], [place] [phone]   [facility] [phone] [place], [place] [phone] Insurance/Medicaid/sponsorship through [facility] and Families [address], [address] [place]                                    [place], [place] [phone] [place] [address], [place] [phone]    Dr. Adele Schilder  [phone]   [facility] Dept. 1) [address], [place] 2) [place] 3)  [place] [address], [place] [phone]  [phone]   [place] [phone] or [phone] (After Hours)

## 2015-07-03 NOTE — Progress Notes (Signed)
Pt came in for refill of htn med  CM noted not pcp  Pt given a list of medcost doctors to assist with finding an in network dr to assist with future prescriptoins
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
## 2017-01-25 ENCOUNTER — Encounter (HOSPITAL_COMMUNITY): Payer: Self-pay | Admitting: Emergency Medicine

## 2017-01-25 ENCOUNTER — Emergency Department (HOSPITAL_COMMUNITY): Payer: PRIVATE HEALTH INSURANCE

## 2017-01-25 ENCOUNTER — Emergency Department (HOSPITAL_COMMUNITY)
Admission: EM | Admit: 2017-01-25 | Discharge: 2017-01-25 | Disposition: A | Discharge status: Home / Self Care | Payer: PRIVATE HEALTH INSURANCE | Attending: Emergency Medicine | Admitting: Emergency Medicine

## 2017-01-25 DIAGNOSIS — I10 Essential (primary) hypertension: Secondary | ICD-10-CM | POA: Diagnosis not present

## 2017-01-25 DIAGNOSIS — R0602 Shortness of breath: Secondary | ICD-10-CM | POA: Diagnosis present

## 2017-01-25 DIAGNOSIS — J441 Chronic obstructive pulmonary disease with (acute) exacerbation: Secondary | ICD-10-CM | POA: Insufficient documentation

## 2017-01-25 DIAGNOSIS — F17210 Nicotine dependence, cigarettes, uncomplicated: Secondary | ICD-10-CM | POA: Diagnosis not present

## 2017-01-25 LAB — BASIC METABOLIC PANEL
ANION GAP: 5 (ref 5–15)
BUN: 9 mg/dL (ref 6–20)
CO2: 25 mmol/L (ref 22–32)
Calcium: 8.9 mg/dL (ref 8.9–10.3)
Chloride: 106 mmol/L (ref 101–111)
Creatinine, Ser: 1.16 mg/dL (ref 0.61–1.24)
GFR calc Af Amer: >60 mL/min (ref >60)
GLUCOSE: 108 mg/dL — AB (ref 65–99)
Potassium: 4.2 mmol/L (ref 3.5–5.1)
Sodium: 136 mmol/L (ref 135–145)

## 2017-01-25 LAB — CBC
HEMATOCRIT: 44.3 % (ref 39.0–52.0)
HEMOGLOBIN: 15.2 g/dL (ref 13.0–17.0)
MCH: 35.3 pg — AB (ref 26.0–34.0)
MCHC: 34.3 g/dL (ref 30.0–36.0)
MCV: 103 fL — ABNORMAL HIGH (ref 78.0–100.0)
Platelets: 160 10*3/uL (ref 150–400)
RBC: 4.3 MIL/uL (ref 4.22–5.81)
RDW: 12.9 % (ref 11.5–15.5)
WBC: 8 10*3/uL (ref 4.0–10.5)

## 2017-01-25 LAB — BRAIN NATRIURETIC PEPTIDE: B Natriuretic Peptide: 188.8 pg/mL — ABNORMAL HIGH (ref 0.0–100.0)

## 2017-01-25 LAB — I-STAT TROPONIN, ED: Troponin i, poc: 0 ng/mL (ref 0.00–0.08)

## 2017-01-25 MED ORDER — AMLODIPINE BESYLATE 5 MG PO TABS
10.0000 mg | ORAL_TABLET | Freq: Once | ORAL | Status: AC
  Administered 2017-01-25: 10 mg via ORAL
  Filled 2017-01-25: qty 2

## 2017-01-25 MED ORDER — IPRATROPIUM BROMIDE 0.02 % IN SOLN
0.5000 mg | Freq: Once | RESPIRATORY_TRACT | Status: AC
  Administered 2017-01-25: 0.5 mg via RESPIRATORY_TRACT
  Filled 2017-01-25: qty 2.5

## 2017-01-25 MED ORDER — ALBUTEROL SULFATE (2.5 MG/3ML) 0.083% IN NEBU
5.0000 mg | INHALATION_SOLUTION | Freq: Once | RESPIRATORY_TRACT | Status: AC
Start: 2017-01-25 — End: 2017-01-25
  Administered 2017-01-25: 5 mg via RESPIRATORY_TRACT
  Filled 2017-01-25: qty 6

## 2017-01-25 MED ORDER — ALBUTEROL SULFATE (2.5 MG/3ML) 0.083% IN NEBU
INHALATION_SOLUTION | RESPIRATORY_TRACT | Status: AC
  Filled 2017-01-25: qty 6

## 2017-01-25 MED ORDER — ALBUTEROL SULFATE (2.5 MG/3ML) 0.083% IN NEBU
5.0000 mg | INHALATION_SOLUTION | Freq: Once | RESPIRATORY_TRACT | Status: AC
  Administered 2017-01-25: 5 mg via RESPIRATORY_TRACT

## 2017-01-25 MED ORDER — PREDNISONE 10 MG (21) PO TBPK: ORAL_TABLET | Freq: Every day | ORAL | 0 refills | Status: DC

## 2017-01-25 MED ORDER — AMLODIPINE BESYLATE 10 MG PO TABS: 10.0000 mg | ORAL_TABLET | Freq: Every day | ORAL | 0 refills | Status: DC

## 2017-01-25 MED ORDER — METHYLPREDNISOLONE SODIUM SUCC 125 MG IJ SOLR
125.0000 mg | Freq: Once | INTRAMUSCULAR | Status: AC
  Administered 2017-01-25: 125 mg via INTRAVENOUS
  Filled 2017-01-25: qty 2

## 2017-01-25 MED ORDER — ALBUTEROL SULFATE HFA 108 (90 BASE) MCG/ACT IN AERS
2.0000 | INHALATION_SPRAY | Freq: Once | RESPIRATORY_TRACT | Status: AC
  Administered 2017-01-25: 2 via RESPIRATORY_TRACT
  Filled 2017-01-25: qty 6.7

## 2017-01-25 NOTE — Discharge Instructions (Signed)
Read the information below.  Use the prescribed medication as directed.  Please discuss all new medications with your pharmacist.  You may return to the Emergency Department at any time for worsening condition or any new symptoms that concern you.   If you develop worsening shortness of breath, uncontrolled wheezing, severe chest pain, or fevers despite using tylenol and/or ibuprofen, return for a recheck.     °

## 2017-01-25 NOTE — ED Notes (Signed)
Patient transported to X-ray 

## 2017-01-25 NOTE — ED Notes (Signed)
This RN attempted IV access twice without success. Another RN to try

## 2017-01-25 NOTE — ED Triage Notes (Addendum)
Pt c/o history of COPD and has had shortness of breath x 2 days. Pt reports that he ran out his inhaler 2 days ago. Pt talking in complete sentences without difficulty. Pt also reports that he is out of his BP medications since Wednesday. Pt does not have a PMD.

## 2017-01-25 NOTE — ED Notes (Signed)
ED Provider at bedside. 
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
## 2017-08-02 ENCOUNTER — Emergency Department (HOSPITAL_COMMUNITY): Payer: PRIVATE HEALTH INSURANCE

## 2017-08-02 ENCOUNTER — Emergency Department (HOSPITAL_COMMUNITY)
Admission: EM | Admit: 2017-08-02 | Discharge: 2017-08-02 | Disposition: A | Discharge status: Home / Self Care | Payer: PRIVATE HEALTH INSURANCE | Attending: Emergency Medicine | Admitting: Emergency Medicine

## 2017-08-02 ENCOUNTER — Encounter (HOSPITAL_COMMUNITY): Payer: Self-pay

## 2017-08-02 ENCOUNTER — Other Ambulatory Visit: Payer: Self-pay

## 2017-08-02 DIAGNOSIS — I10 Essential (primary) hypertension: Secondary | ICD-10-CM | POA: Insufficient documentation

## 2017-08-02 DIAGNOSIS — Z79899 Other long term (current) drug therapy: Secondary | ICD-10-CM | POA: Insufficient documentation

## 2017-08-02 DIAGNOSIS — J449 Chronic obstructive pulmonary disease, unspecified: Secondary | ICD-10-CM | POA: Insufficient documentation

## 2017-08-02 DIAGNOSIS — F17210 Nicotine dependence, cigarettes, uncomplicated: Secondary | ICD-10-CM | POA: Insufficient documentation

## 2017-08-02 DIAGNOSIS — I158 Other secondary hypertension: Secondary | ICD-10-CM

## 2017-08-02 DIAGNOSIS — R0602 Shortness of breath: Secondary | ICD-10-CM | POA: Diagnosis not present

## 2017-08-02 LAB — CBC
HCT: 45.2 % (ref 39.0–52.0)
HEMOGLOBIN: 15.4 g/dL (ref 13.0–17.0)
MCH: 34.9 pg — ABNORMAL HIGH (ref 26.0–34.0)
MCHC: 34.1 g/dL (ref 30.0–36.0)
MCV: 102.5 fL — ABNORMAL HIGH (ref 78.0–100.0)
Platelets: 139 10*3/uL — ABNORMAL LOW (ref 150–400)
RBC: 4.41 MIL/uL (ref 4.22–5.81)
RDW: 12.8 % (ref 11.5–15.5)
WBC: 7.7 10*3/uL (ref 4.0–10.5)

## 2017-08-02 LAB — BASIC METABOLIC PANEL
Anion gap: 7 (ref 5–15)
BUN: 10 mg/dL (ref 6–20)
CHLORIDE: 105 mmol/L (ref 101–111)
CO2: 25 mmol/L (ref 22–32)
Calcium: 9.1 mg/dL (ref 8.9–10.3)
Creatinine, Ser: 1.1 mg/dL (ref 0.61–1.24)
GFR calc non Af Amer: >60 mL/min (ref >60)
Glucose, Bld: 81 mg/dL (ref 65–99)
POTASSIUM: 4.8 mmol/L (ref 3.5–5.1)
SODIUM: 137 mmol/L (ref 135–145)

## 2017-08-02 LAB — I-STAT TROPONIN, ED: TROPONIN I, POC: 0 ng/mL (ref 0.00–0.08)

## 2017-08-02 MED ORDER — HYDRALAZINE HCL 10 MG PO TABS
10.0000 mg | ORAL_TABLET | Freq: Three times a day (TID) | ORAL | 0 refills | Status: DC
Start: 2017-08-02 — End: 2018-11-17

## 2017-08-02 MED ORDER — AMLODIPINE BESYLATE 5 MG PO TABS
10.0000 mg | ORAL_TABLET | Freq: Every day | ORAL | Status: DC
  Administered 2017-08-02: 10 mg via ORAL
  Filled 2017-08-02: qty 2

## 2017-08-02 MED ORDER — HYDROCHLOROTHIAZIDE 25 MG PO TABS: 25.0000 mg | ORAL_TABLET | Freq: Every day | ORAL | 1 refills | Status: DC

## 2017-08-02 MED ORDER — HYDRALAZINE HCL 25 MG PO TABS
25.0000 mg | ORAL_TABLET | Freq: Once | ORAL | Status: AC
  Administered 2017-08-02: 25 mg via ORAL
  Filled 2017-08-02: qty 1

## 2017-08-02 MED ORDER — AMLODIPINE BESYLATE 10 MG PO TABS: 10.0000 mg | ORAL_TABLET | Freq: Every day | ORAL | 2 refills | Status: DC

## 2017-08-02 MED ORDER — ALBUTEROL SULFATE (2.5 MG/3ML) 0.083% IN NEBU
5.0000 mg | INHALATION_SOLUTION | Freq: Once | RESPIRATORY_TRACT | Status: AC
  Administered 2017-08-02: 5 mg via RESPIRATORY_TRACT

## 2017-08-02 MED ORDER — LISINOPRIL 10 MG PO TABS
10.0000 mg | ORAL_TABLET | Freq: Every day | ORAL | Status: DC
  Administered 2017-08-02: 10 mg via ORAL
  Filled 2017-08-02: qty 1

## 2017-08-02 MED ORDER — ALBUTEROL SULFATE HFA 108 (90 BASE) MCG/ACT IN AERS: 2.0000 | INHALATION_SPRAY | RESPIRATORY_TRACT | 2 refills | Status: DC | PRN

## 2017-08-02 MED ORDER — HYDROCHLOROTHIAZIDE 25 MG PO TABS
25.0000 mg | ORAL_TABLET | Freq: Every day | ORAL | Status: DC
  Administered 2017-08-02: 25 mg via ORAL
  Filled 2017-08-02: qty 1

## 2017-08-02 MED ORDER — ALBUTEROL SULFATE (2.5 MG/3ML) 0.083% IN NEBU
INHALATION_SOLUTION | RESPIRATORY_TRACT | Status: AC
  Administered 2017-08-02: 5 mg via RESPIRATORY_TRACT
  Filled 2017-08-02: qty 6

## 2017-08-02 NOTE — ED Triage Notes (Signed)
Patient complains of  increased BP and SOB x 3 days, COPD and out of all meds for same. Patient denies pain, continuing to smoke
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
## 2017-09-01 ENCOUNTER — Encounter (HOSPITAL_COMMUNITY): Payer: Self-pay | Admitting: Family Medicine

## 2017-09-01 ENCOUNTER — Ambulatory Visit (HOSPITAL_COMMUNITY)
Admission: EM | Admit: 2017-09-01 | Discharge: 2017-09-01 | Disposition: A | Discharge status: Home / Self Care | Payer: PRIVATE HEALTH INSURANCE | Attending: Family Medicine | Admitting: Family Medicine

## 2017-09-01 DIAGNOSIS — R05 Cough: Secondary | ICD-10-CM | POA: Diagnosis not present

## 2017-09-01 DIAGNOSIS — R0602 Shortness of breath: Secondary | ICD-10-CM | POA: Diagnosis not present

## 2017-09-01 MED ORDER — ALBUTEROL SULFATE HFA 108 (90 BASE) MCG/ACT IN AERS: 2.0000 | INHALATION_SPRAY | RESPIRATORY_TRACT | 2 refills | Status: DC | PRN

## 2017-09-01 MED ORDER — ALBUTEROL SULFATE HFA 108 (90 BASE) MCG/ACT IN AERS
INHALATION_SPRAY | RESPIRATORY_TRACT | Status: AC
  Filled 2017-09-01: qty 6.7

## 2017-09-01 MED ORDER — IPRATROPIUM-ALBUTEROL 0.5-2.5 (3) MG/3ML IN SOLN
3.0000 mL | Freq: Once | RESPIRATORY_TRACT | Status: AC
Start: 2017-09-01 — End: 2017-09-01
  Administered 2017-09-01: 3 mL via RESPIRATORY_TRACT

## 2017-09-01 MED ORDER — ALBUTEROL SULFATE HFA 108 (90 BASE) MCG/ACT IN AERS
2.0000 | INHALATION_SPRAY | Freq: Once | RESPIRATORY_TRACT | Status: AC
  Administered 2017-09-01: 2 via RESPIRATORY_TRACT

## 2017-09-01 MED ORDER — IPRATROPIUM-ALBUTEROL 0.5-2.5 (3) MG/3ML IN SOLN
RESPIRATORY_TRACT | Status: AC
  Filled 2017-09-01: qty 3

## 2017-09-01 NOTE — ED Triage Notes (Signed)
Pt here for SOB since 10 am. Hx of COPD. Currently has no albuterol.

## 2017-09-01 NOTE — Discharge Instructions (Signed)
I have sent in a refill on your inhaler. We have given you the albuterol inhaler here today as well. Please use every 4-6 hours 1-2 puffs as needed for shortness of breath/chest tightness.   Albuterol should be used as needed, since you are needing this frequently I recommend you setting up a primary care provider in order to have better management of your COPD.
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
## 2018-01-13 ENCOUNTER — Ambulatory Visit (INDEPENDENT_AMBULATORY_CARE_PROVIDER_SITE_OTHER): Payer: PRIVATE HEALTH INSURANCE | Admitting: Nurse Practitioner

## 2018-02-20 ENCOUNTER — Ambulatory Visit (HOSPITAL_COMMUNITY)
Admission: EM | Admit: 2018-02-20 | Discharge: 2018-02-20 | Disposition: A | Discharge status: Home / Self Care | Payer: PRIVATE HEALTH INSURANCE | Attending: Internal Medicine | Admitting: Internal Medicine

## 2018-02-20 ENCOUNTER — Encounter (HOSPITAL_COMMUNITY): Payer: Self-pay | Admitting: Emergency Medicine

## 2018-02-20 DIAGNOSIS — I10 Essential (primary) hypertension: Secondary | ICD-10-CM

## 2018-02-20 DIAGNOSIS — J449 Chronic obstructive pulmonary disease, unspecified: Secondary | ICD-10-CM

## 2018-02-20 DIAGNOSIS — Z760 Encounter for issue of repeat prescription: Secondary | ICD-10-CM

## 2018-02-20 DIAGNOSIS — R0602 Shortness of breath: Secondary | ICD-10-CM

## 2018-02-20 MED ORDER — ALBUTEROL SULFATE HFA 108 (90 BASE) MCG/ACT IN AERS
INHALATION_SPRAY | RESPIRATORY_TRACT | Status: AC
  Filled 2018-02-20: qty 6.7

## 2018-02-20 MED ORDER — ALBUTEROL SULFATE 108 (90 BASE) MCG/ACT IN AEPB: 2.0000 | INHALATION_SPRAY | Freq: Four times a day (QID) | RESPIRATORY_TRACT | 2 refills | Status: DC | PRN

## 2018-02-20 MED ORDER — ALBUTEROL SULFATE HFA 108 (90 BASE) MCG/ACT IN AERS
2.0000 | INHALATION_SPRAY | Freq: Once | RESPIRATORY_TRACT | Status: AC
  Administered 2018-02-20: 2 via RESPIRATORY_TRACT

## 2018-02-20 NOTE — Discharge Instructions (Addendum)
Albuterol inhaler given in office Prescription sent with a couple of refills to use as needed for SOB and wheezing PCP assistance initiated Discussed the importance of tobacco cessation Return or go to the ED if you have any new or worsening symptoms
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
## 2018-02-20 NOTE — ED Triage Notes (Signed)
Pt c/o sob, states he has COPD and is here for a refill on his inhaler.

## 2018-06-13 ENCOUNTER — Emergency Department (HOSPITAL_COMMUNITY)
Admission: EM | Admit: 2018-06-13 | Discharge: 2018-06-13 | Disposition: A | Discharge status: Home / Self Care | Payer: PRIVATE HEALTH INSURANCE | Attending: Emergency Medicine | Admitting: Emergency Medicine

## 2018-06-13 ENCOUNTER — Encounter (HOSPITAL_COMMUNITY): Payer: Self-pay | Admitting: *Deleted

## 2018-06-13 DIAGNOSIS — I10 Essential (primary) hypertension: Secondary | ICD-10-CM | POA: Insufficient documentation

## 2018-06-13 DIAGNOSIS — Z760 Encounter for issue of repeat prescription: Secondary | ICD-10-CM | POA: Diagnosis not present

## 2018-06-13 DIAGNOSIS — F17210 Nicotine dependence, cigarettes, uncomplicated: Secondary | ICD-10-CM | POA: Diagnosis not present

## 2018-06-13 DIAGNOSIS — J449 Chronic obstructive pulmonary disease, unspecified: Secondary | ICD-10-CM | POA: Diagnosis not present

## 2018-06-13 DIAGNOSIS — Z79899 Other long term (current) drug therapy: Secondary | ICD-10-CM | POA: Diagnosis not present

## 2018-06-13 MED ORDER — ALBUTEROL SULFATE HFA 108 (90 BASE) MCG/ACT IN AERS
2.0000 | INHALATION_SPRAY | RESPIRATORY_TRACT | Status: DC | PRN
  Administered 2018-06-13: 2 via RESPIRATORY_TRACT
  Filled 2018-06-13: qty 6.7

## 2018-06-13 MED ORDER — AMLODIPINE BESYLATE 10 MG PO TABS: 10.0000 mg | ORAL_TABLET | Freq: Every day | ORAL | 1 refills | Status: DC

## 2018-06-13 NOTE — Discharge Instructions (Addendum)
Please use the phone number in the back of this paperwork to establish care with a regular doctor.  Return to the ER if you have any new or concerning symptoms like chest pain, trouble breathing, trouble seeing, headache or trouble with urinating.

## 2018-06-13 NOTE — ED Triage Notes (Signed)
Pt states he ran out of his norvasc and inhaler and does not have PCP. Pt would like refills. Pt denies headache, dizziness.
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
## 2018-08-03 ENCOUNTER — Encounter (HOSPITAL_COMMUNITY): Payer: Self-pay | Admitting: Emergency Medicine

## 2018-08-03 ENCOUNTER — Other Ambulatory Visit: Payer: Self-pay

## 2018-08-03 ENCOUNTER — Ambulatory Visit (INDEPENDENT_AMBULATORY_CARE_PROVIDER_SITE_OTHER): Payer: PRIVATE HEALTH INSURANCE

## 2018-08-03 ENCOUNTER — Ambulatory Visit (HOSPITAL_COMMUNITY)
Admission: EM | Admit: 2018-08-03 | Discharge: 2018-08-03 | Disposition: A | Discharge status: Home / Self Care | Payer: PRIVATE HEALTH INSURANCE | Attending: Family Medicine | Admitting: Family Medicine

## 2018-08-03 DIAGNOSIS — J441 Chronic obstructive pulmonary disease with (acute) exacerbation: Secondary | ICD-10-CM | POA: Diagnosis not present

## 2018-08-03 DIAGNOSIS — I10 Essential (primary) hypertension: Secondary | ICD-10-CM | POA: Diagnosis not present

## 2018-08-03 DIAGNOSIS — Z720 Tobacco use: Secondary | ICD-10-CM

## 2018-08-03 MED ORDER — IPRATROPIUM-ALBUTEROL 0.5-2.5 (3) MG/3ML IN SOLN
RESPIRATORY_TRACT | Status: AC
  Filled 2018-08-03: qty 3

## 2018-08-03 MED ORDER — PREDNISONE 20 MG PO TABS: 20.0000 mg | ORAL_TABLET | Freq: Two times a day (BID) | ORAL | 0 refills | Status: AC

## 2018-08-03 MED ORDER — IPRATROPIUM-ALBUTEROL 0.5-2.5 (3) MG/3ML IN SOLN
3.0000 mL | Freq: Once | RESPIRATORY_TRACT | Status: AC
  Administered 2018-08-03: 3 mL via RESPIRATORY_TRACT

## 2018-08-03 MED ORDER — ALBUTEROL SULFATE HFA 108 (90 BASE) MCG/ACT IN AERS
INHALATION_SPRAY | RESPIRATORY_TRACT | Status: AC
  Filled 2018-08-03: qty 6.7
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
## 2018-08-03 NOTE — ED Triage Notes (Signed)
Pt reports history of COPD.  Since last Wednesday or Thursday he has been having SOB with no relief from his rescue inhaler.  Pt used the last of his inhaler this morning.

## 2018-08-03 NOTE — Discharge Instructions (Addendum)
We will treat you for a COPD exacerbation with steroids for the next 5 days Albuterol inhaler to use as needed for cough, wheezing, shortness of breath Your x-ray did not show any signs of pneumonia

## 2018-08-28 ENCOUNTER — Encounter: Payer: Self-pay | Admitting: Emergency Medicine

## 2018-08-28 ENCOUNTER — Ambulatory Visit
Admission: EM | Admit: 2018-08-28 | Discharge: 2018-08-28 | Disposition: A | Discharge status: Home / Self Care | Payer: PRIVATE HEALTH INSURANCE | Attending: Emergency Medicine | Admitting: Emergency Medicine

## 2018-08-28 DIAGNOSIS — J441 Chronic obstructive pulmonary disease with (acute) exacerbation: Secondary | ICD-10-CM

## 2018-08-28 MED ORDER — PREDNISONE 50 MG PO TABS: 50.0000 mg | ORAL_TABLET | Freq: Every day | ORAL | 0 refills | Status: AC

## 2018-08-28 MED ORDER — ALBUTEROL SULFATE HFA 108 (90 BASE) MCG/ACT IN AERS
1.0000 | INHALATION_SPRAY | Freq: Once | RESPIRATORY_TRACT | Status: AC
  Administered 2018-08-28: 2 via RESPIRATORY_TRACT
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
## 2018-08-28 NOTE — ED Notes (Signed)
Patient able to ambulate independently  

## 2018-08-28 NOTE — Discharge Instructions (Signed)
We provided you with an albuterol inhaler, please continue to use as needed Please begin prednisone daily with breakfast for the next 5 days Please establish care with primary care is likely you need a daily maintenance inhaler so that you do not have to use your albuterol as frequently. Continue to monitor temperature and breathing, please follow-up if symptoms worsening or not improving with treatment today

## 2018-08-28 NOTE — ED Triage Notes (Signed)
Pt presents to [facility] for assessment of shortness of breath.  States he ran out of his rescue inhaler last night, and did not have any more refills.

## 2018-11-17 ENCOUNTER — Other Ambulatory Visit: Payer: Self-pay

## 2018-11-17 ENCOUNTER — Encounter (HOSPITAL_COMMUNITY): Payer: Self-pay

## 2018-11-17 ENCOUNTER — Ambulatory Visit (HOSPITAL_COMMUNITY)
Admission: EM | Admit: 2018-11-17 | Discharge: 2018-11-17 | Disposition: A | Discharge status: Home / Self Care | Payer: PRIVATE HEALTH INSURANCE | Attending: Family Medicine | Admitting: Family Medicine

## 2018-11-17 DIAGNOSIS — J441 Chronic obstructive pulmonary disease with (acute) exacerbation: Secondary | ICD-10-CM | POA: Diagnosis not present

## 2018-11-17 DIAGNOSIS — Z760 Encounter for issue of repeat prescription: Secondary | ICD-10-CM

## 2018-11-17 DIAGNOSIS — I10 Essential (primary) hypertension: Secondary | ICD-10-CM

## 2018-11-17 DIAGNOSIS — J45909 Unspecified asthma, uncomplicated: Secondary | ICD-10-CM | POA: Diagnosis not present

## 2018-11-17 MED ORDER — AMLODIPINE BESYLATE 10 MG PO TABS: 10.0000 mg | ORAL_TABLET | Freq: Every day | ORAL | 0 refills | Status: DC

## 2018-11-17 MED ORDER — ALBUTEROL SULFATE HFA 108 (90 BASE) MCG/ACT IN AERS
2.0000 | INHALATION_SPRAY | Freq: Once | RESPIRATORY_TRACT | Status: AC
  Administered 2018-11-17: 2 via RESPIRATORY_TRACT

## 2018-11-17 MED ORDER — FLUTICASONE-SALMETEROL 250-50 MCG/DOSE IN AEPB: 1.0000 | INHALATION_SPRAY | Freq: Two times a day (BID) | RESPIRATORY_TRACT | 0 refills | Status: DC

## 2018-11-17 MED ORDER — ALBUTEROL SULFATE HFA 108 (90 BASE) MCG/ACT IN AERS
INHALATION_SPRAY | RESPIRATORY_TRACT | Status: AC
  Filled 2018-11-17: qty 6.7

## 2018-11-17 MED ORDER — PREDNISONE 50 MG PO TABS: 50.0000 mg | ORAL_TABLET | Freq: Every day | ORAL | 0 refills | Status: AC

## 2018-11-17 MED ORDER — ALBUTEROL SULFATE 108 (90 BASE) MCG/ACT IN AEPB: 1.0000 | INHALATION_SPRAY | Freq: Four times a day (QID) | RESPIRATORY_TRACT | 0 refills | Status: DC | PRN

## 2018-11-17 NOTE — Discharge Instructions (Signed)
Prednisone daily for 5 days with food Restart albuterol as needed Begin advair twice daily Follow up with primary care contact below  Your blood pressure was elevated today in clinic. Please be sure to take blood pressure medications as prescribed. Please monitor your blood pressure at home or when you go to a [facility]. Please follow up with your primary care doctor to recheck blood pressure and discuss any need for medication changes.   Please go to Emergency Room if you start to experience severe headache, vision changes, decreased urine production, chest pain, shortness of breath, speech slurring, one sided weakness.
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
## 2018-11-17 NOTE — ED Triage Notes (Signed)
Pt present SOB, symptoms started last night. Pt also need a refill on his blood pressure  medication.

## 2019-01-11 ENCOUNTER — Emergency Department (HOSPITAL_COMMUNITY)
Admission: EM | Admit: 2019-01-11 | Discharge: 2019-01-11 | Disposition: A | Discharge status: Home / Self Care | Payer: PRIVATE HEALTH INSURANCE | Attending: Emergency Medicine | Admitting: Emergency Medicine

## 2019-01-11 ENCOUNTER — Other Ambulatory Visit: Payer: Self-pay

## 2019-01-11 ENCOUNTER — Encounter (HOSPITAL_COMMUNITY): Payer: Self-pay | Admitting: Emergency Medicine

## 2019-01-11 DIAGNOSIS — Z20828 Contact with and (suspected) exposure to other viral communicable diseases: Secondary | ICD-10-CM | POA: Diagnosis not present

## 2019-01-11 DIAGNOSIS — J449 Chronic obstructive pulmonary disease, unspecified: Secondary | ICD-10-CM | POA: Insufficient documentation

## 2019-01-11 DIAGNOSIS — I10 Essential (primary) hypertension: Secondary | ICD-10-CM | POA: Insufficient documentation

## 2019-01-11 DIAGNOSIS — J029 Acute pharyngitis, unspecified: Secondary | ICD-10-CM | POA: Diagnosis not present

## 2019-01-11 DIAGNOSIS — J069 Acute upper respiratory infection, unspecified: Secondary | ICD-10-CM

## 2019-01-11 DIAGNOSIS — M7918 Myalgia, other site: Secondary | ICD-10-CM | POA: Diagnosis not present

## 2019-01-11 DIAGNOSIS — Z79899 Other long term (current) drug therapy: Secondary | ICD-10-CM | POA: Insufficient documentation

## 2019-01-11 DIAGNOSIS — F17210 Nicotine dependence, cigarettes, uncomplicated: Secondary | ICD-10-CM | POA: Diagnosis not present

## 2019-01-11 DIAGNOSIS — R05 Cough: Secondary | ICD-10-CM | POA: Diagnosis present
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
## 2019-01-11 NOTE — Discharge Instructions (Signed)
You were seen in the emergency department for 3 days of feeling fever along with body aches and cough.  You were tested for Covid.  Your test will result in 1 to 2 days.  You will need to isolate until the results of your tests are back and your symptoms have resolved.  Please return if any severe worsening of your symptoms.

## 2019-01-11 NOTE — ED Triage Notes (Signed)
Pt. From home c/o cough and generalized body aches.  Cough is a chronic productive cough that has been present for about 20 years. Pt. States having hx of COPD and current everyday 1 pack a day smoker.  Generalized body aches started Friday. Pt. States feeling weak and dizzy previously but currently.  Pt denies sore throat.pt has had no sick contact. Pt. Denies travel.   Temp 98.5

## 2019-01-12 LAB — NOVEL CORONAVIRUS, NAA (HOSP ORDER, SEND-OUT TO REF LAB; TAT 18-24 HRS): SARS-CoV-2, NAA: NOT DETECTED

## 2019-03-30 ENCOUNTER — Other Ambulatory Visit: Payer: Self-pay

## 2019-03-30 ENCOUNTER — Ambulatory Visit (INDEPENDENT_AMBULATORY_CARE_PROVIDER_SITE_OTHER): Payer: PRIVATE HEALTH INSURANCE | Admitting: Primary Care

## 2019-03-30 ENCOUNTER — Encounter (INDEPENDENT_AMBULATORY_CARE_PROVIDER_SITE_OTHER): Payer: Self-pay | Admitting: Primary Care

## 2019-03-30 VITALS — BP 160/80 | HR 88 | Temp 98.8°F | Ht 70.0 in | Wt 123.2 lb

## 2019-03-30 DIAGNOSIS — J441 Chronic obstructive pulmonary disease with (acute) exacerbation: Secondary | ICD-10-CM | POA: Diagnosis not present

## 2019-03-30 DIAGNOSIS — I10 Essential (primary) hypertension: Secondary | ICD-10-CM | POA: Diagnosis not present

## 2019-03-30 DIAGNOSIS — Z7689 Persons encountering health services in other specified circumstances: Secondary | ICD-10-CM

## 2019-03-30 DIAGNOSIS — Z1159 Encounter for screening for other viral diseases: Secondary | ICD-10-CM

## 2019-03-30 DIAGNOSIS — Z1211 Encounter for screening for malignant neoplasm of colon: Secondary | ICD-10-CM

## 2019-03-30 DIAGNOSIS — F17200 Nicotine dependence, unspecified, uncomplicated: Secondary | ICD-10-CM

## 2019-03-30 DIAGNOSIS — Z114 Encounter for screening for human immunodeficiency virus [HIV]: Secondary | ICD-10-CM

## 2019-03-30 MED ORDER — AMLODIPINE BESYLATE 10 MG PO TABS: 10.0000 mg | ORAL_TABLET | Freq: Every day | ORAL | 3 refills | Status: DC

## 2019-03-30 MED ORDER — FLUTICASONE-SALMETEROL 250-50 MCG/DOSE IN AEPB: 1.0000 | INHALATION_SPRAY | Freq: Two times a day (BID) | RESPIRATORY_TRACT | 3 refills | Status: DC

## 2019-03-30 MED ORDER — PROAIR RESPICLICK 108 (90 BASE) MCG/ACT IN AEPB: 1.0000 | INHALATION_SPRAY | Freq: Four times a day (QID) | RESPIRATORY_TRACT | 3 refills | Status: DC | PRN

## 2019-03-30 MED ORDER — HYDROCHLOROTHIAZIDE 25 MG PO TABS: 25.0000 mg | ORAL_TABLET | Freq: Every day | ORAL | 3 refills | Status: DC

## 2019-03-30 MED ORDER — CLONIDINE HCL 0.1 MG PO TABS
0.2000 mg | ORAL_TABLET | Freq: Once | ORAL | Status: AC
  Administered 2019-03-30: 0.2 mg via ORAL
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
## 2019-03-30 NOTE — Patient Instructions (Signed)

## 2019-03-31 LAB — CMP14+EGFR
ALT: 8 IU/L (ref 0–44)
AST: 22 IU/L (ref 0–40)
Albumin/Globulin Ratio: 1.2 (ref 1.2–2.2)
Albumin: 4.2 g/dL (ref 3.8–4.9)
Alkaline Phosphatase: 67 IU/L (ref 39–117)
BUN/Creatinine Ratio: 6 — ABNORMAL LOW (ref 9–20)
BUN: 6 mg/dL (ref 6–24)
Bilirubin Total: 0.4 mg/dL (ref 0.0–1.2)
CO2: 24 mmol/L (ref 20–29)
Calcium: 9.7 mg/dL (ref 8.7–10.2)
Chloride: 101 mmol/L (ref 96–106)
Creatinine, Ser: 0.98 mg/dL (ref 0.76–1.27)
GFR calc Af Amer: 99 mL/min/{1.73_m2} (ref >59)
GFR calc non Af Amer: 86 mL/min/{1.73_m2} (ref >59)
Globulin, Total: 3.5 g/dL (ref 1.5–4.5)
Glucose: 97 mg/dL (ref 65–99)
Potassium: 4.3 mmol/L (ref 3.5–5.2)
Sodium: 139 mmol/L (ref 134–144)
Total Protein: 7.7 g/dL (ref 6.0–8.5)

## 2019-03-31 LAB — LIPID PANEL
Chol/HDL Ratio: 2.6 ratio (ref 0.0–5.0)
Cholesterol, Total: 131 mg/dL (ref 100–199)
HDL: 50 mg/dL (ref >39)
LDL Calculated: 70 mg/dL (ref 0–99)
Triglycerides: 54 mg/dL (ref 0–149)
VLDL Cholesterol Cal: 11 mg/dL (ref 5–40)

## 2019-03-31 LAB — CBC WITH DIFFERENTIAL/PLATELET
Basophils Absolute: 0 10*3/uL (ref 0.0–0.2)
Basos: 0 %
EOS (ABSOLUTE): 0.3 10*3/uL (ref 0.0–0.4)
Eos: 3 %
Hematocrit: 41.2 % (ref 37.5–51.0)
Hemoglobin: 14.2 g/dL (ref 13.0–17.7)
Immature Grans (Abs): 0 10*3/uL (ref 0.0–0.1)
Immature Granulocytes: 0 %
Lymphocytes Absolute: 2.2 10*3/uL (ref 0.7–3.1)
Lymphs: 27 %
MCH: 34.7 pg — ABNORMAL HIGH (ref 26.6–33.0)
MCHC: 34.5 g/dL (ref 31.5–35.7)
MCV: 101 fL — ABNORMAL HIGH (ref 79–97)
Monocytes Absolute: 0.7 10*3/uL (ref 0.1–0.9)
Monocytes: 8 %
Neutrophils Absolute: 4.9 10*3/uL (ref 1.4–7.0)
Neutrophils: 62 %
Platelets: 230 10*3/uL (ref 150–450)
RBC: 4.09 x10E6/uL — ABNORMAL LOW (ref 4.14–5.80)
RDW: 12.4 % (ref 11.6–15.4)
WBC: 8 10*3/uL (ref 3.4–10.8)

## 2019-03-31 LAB — HIV ANTIBODY (ROUTINE TESTING W REFLEX): HIV Screen 4th Generation wRfx: NONREACTIVE

## 2019-03-31 LAB — HEPATITIS C ANTIBODY: Hep C Virus Ab: <0.1 s/co ratio (ref 0.0–0.9)

## 2019-04-01 ENCOUNTER — Telehealth (INDEPENDENT_AMBULATORY_CARE_PROVIDER_SITE_OTHER): Payer: Self-pay

## 2019-04-01 NOTE — Telephone Encounter (Signed)
Spoke with patient- name and date of birth verified. He is aware that labs are normal. [name], CMA

## 2019-04-01 NOTE — Telephone Encounter (Signed)
-----   Message from Kerin Perna, NP sent at 03/31/2019  1:59 PM EDT ----- I have reviewed all labs and they are normal/unremarkable. Please notify patient of the results. Have them schedule appointment if there are any other concerns or questions. Thank you.

## 2019-04-13 ENCOUNTER — Encounter (INDEPENDENT_AMBULATORY_CARE_PROVIDER_SITE_OTHER): Payer: Self-pay | Admitting: Primary Care

## 2019-04-13 ENCOUNTER — Other Ambulatory Visit: Payer: Self-pay

## 2019-04-13 ENCOUNTER — Ambulatory Visit (INDEPENDENT_AMBULATORY_CARE_PROVIDER_SITE_OTHER): Payer: PRIVATE HEALTH INSURANCE | Admitting: Primary Care

## 2019-04-13 VITALS — BP 136/91 | HR 73 | Temp 97.6°F | Ht 70.0 in | Wt 119.8 lb

## 2019-04-13 DIAGNOSIS — J441 Chronic obstructive pulmonary disease with (acute) exacerbation: Secondary | ICD-10-CM | POA: Diagnosis not present

## 2019-04-13 DIAGNOSIS — F17210 Nicotine dependence, cigarettes, uncomplicated: Secondary | ICD-10-CM

## 2019-04-13 DIAGNOSIS — Z1211 Encounter for screening for malignant neoplasm of colon: Secondary | ICD-10-CM

## 2019-04-13 DIAGNOSIS — Z23 Encounter for immunization: Secondary | ICD-10-CM | POA: Diagnosis not present

## 2019-04-13 DIAGNOSIS — I10 Essential (primary) hypertension: Secondary | ICD-10-CM

## 2019-04-13 MED ORDER — METOPROLOL TARTRATE 25 MG PO TABS: 25.0000 mg | ORAL_TABLET | Freq: Two times a day (BID) | ORAL | 3 refills | Status: DC

## 2019-04-13 NOTE — Patient Instructions (Signed)
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
## 2019-04-14 LAB — FECAL OCCULT BLOOD, IMMUNOCHEMICAL: Fecal Occult Bld: NEGATIVE

## 2019-04-16 ENCOUNTER — Telehealth (INDEPENDENT_AMBULATORY_CARE_PROVIDER_SITE_OTHER): Payer: Self-pay

## 2019-04-16 NOTE — Telephone Encounter (Signed)
-----   Message from Kerin Perna, NP sent at 04/15/2019  4:10 PM EDT ----- Negative FOBT repeat in 1 year

## 2019-04-16 NOTE — Telephone Encounter (Signed)
Patient aware that FOBT was negative, repeat in one year. [name], CMA

## 2019-04-19 ENCOUNTER — Other Ambulatory Visit (HOSPITAL_COMMUNITY)
Admission: RE | Admit: 2019-04-19 | Discharge: 2019-04-19 | Disposition: A | Discharge status: Home / Self Care | Payer: PRIVATE HEALTH INSURANCE | Source: Ambulatory Visit | Attending: Primary Care | Admitting: Primary Care

## 2019-04-19 DIAGNOSIS — Z01812 Encounter for preprocedural laboratory examination: Secondary | ICD-10-CM | POA: Insufficient documentation

## 2019-04-19 DIAGNOSIS — Z20828 Contact with and (suspected) exposure to other viral communicable diseases: Secondary | ICD-10-CM | POA: Diagnosis not present

## 2019-04-19 LAB — SARS CORONAVIRUS 2 (TAT 6-24 HRS): SARS Coronavirus 2: NEGATIVE

## 2019-04-20 ENCOUNTER — Telehealth (INDEPENDENT_AMBULATORY_CARE_PROVIDER_SITE_OTHER): Payer: Self-pay

## 2019-04-20 NOTE — Telephone Encounter (Signed)
-----   Message from Kerin Perna, NP sent at 04/19/2019  9:45 PM EDT ----- COVID- test was negative. Safety during COVID Pandemic and steps and precautions to be taken including social distancing, frequent hand wash and use of detergent soap, gels and I ask you to avoid touching mouth, nose, eyes, ears with your hands. I encouraged regular walking around the neighborhood and exercise and regular diet, as long as social distancing can be maintained.

## 2019-04-20 NOTE — Telephone Encounter (Signed)
Patient is aware that covid test result was negative. [name], CMA

## 2019-04-23 ENCOUNTER — Other Ambulatory Visit: Payer: Self-pay

## 2019-04-23 ENCOUNTER — Ambulatory Visit (HOSPITAL_COMMUNITY)
Admission: RE | Admit: 2019-04-23 | Discharge: 2019-04-23 | Disposition: A | Discharge status: Home / Self Care | Payer: PRIVATE HEALTH INSURANCE | Source: Ambulatory Visit | Attending: Primary Care | Admitting: Primary Care

## 2019-04-23 DIAGNOSIS — J441 Chronic obstructive pulmonary disease with (acute) exacerbation: Secondary | ICD-10-CM | POA: Insufficient documentation

## 2019-04-23 LAB — PULMONARY FUNCTION TEST
DL/VA % pred: 38 %
DL/VA: 1.67 ml/min/mmHg/L
DLCO unc % pred: 33 %
DLCO unc: 9.13 ml/min/mmHg
FEF 25-75 Post: 0.68 L/sec
FEF 25-75 Pre: 0.42 L/sec
FEF2575-%Change-Post: 62 %
FEF2575-%Pred-Post: 22 %
FEF2575-%Pred-Pre: 13 %
FEV1-%Change-Post: 16 %
FEV1-%Pred-Post: 46 %
FEV1-%Pred-Pre: 40 %
FEV1-Post: 1.45 L
FEV1-Pre: 1.25 L
FEV1FVC-%Change-Post: -1 %
FEV1FVC-%Pred-Pre: 53 %
FEV6-%Change-Post: 15 %
FEV6-%Pred-Post: 77 %
FEV6-%Pred-Pre: 66 %
FEV6-Post: 2.95 L
FEV6-Pre: 2.55 L
FEV6FVC-%Change-Post: -1 %
FEV6FVC-%Pred-Post: 87 %
FEV6FVC-%Pred-Pre: 89 %
FVC-%Change-Post: 17 %
FVC-%Pred-Post: 88 %
FVC-%Pred-Pre: 75 %
FVC-Post: 3.48 L
FVC-Pre: 2.96 L
Post FEV1/FVC ratio: 42 %
Post FEV6/FVC ratio: 85 %
Pre FEV1/FVC ratio: 42 %
Pre FEV6/FVC Ratio: 86 %
RV % pred: 212 %
RV: 4.47 L
TLC % pred: 112 %
TLC: 7.64 L

## 2019-04-23 MED ORDER — ALBUTEROL SULFATE (2.5 MG/3ML) 0.083% IN NEBU
2.5000 mg | INHALATION_SOLUTION | Freq: Once | RESPIRATORY_TRACT | Status: AC
  Administered 2019-04-23: 2.5 mg via RESPIRATORY_TRACT

## 2019-05-11 ENCOUNTER — Other Ambulatory Visit: Payer: Self-pay

## 2019-05-11 ENCOUNTER — Ambulatory Visit (INDEPENDENT_AMBULATORY_CARE_PROVIDER_SITE_OTHER): Payer: Managed Care, Other (non HMO) | Admitting: Primary Care

## 2019-05-11 ENCOUNTER — Encounter (INDEPENDENT_AMBULATORY_CARE_PROVIDER_SITE_OTHER): Payer: Self-pay | Admitting: Primary Care

## 2019-05-11 VITALS — BP 143/96 | HR 67 | Temp 97.3°F | Ht 70.0 in | Wt 122.8 lb

## 2019-05-11 DIAGNOSIS — K921 Melena: Secondary | ICD-10-CM

## 2019-05-11 DIAGNOSIS — F17210 Nicotine dependence, cigarettes, uncomplicated: Secondary | ICD-10-CM

## 2019-05-11 DIAGNOSIS — I10 Essential (primary) hypertension: Secondary | ICD-10-CM

## 2019-05-11 DIAGNOSIS — F17200 Nicotine dependence, unspecified, uncomplicated: Secondary | ICD-10-CM

## 2019-05-11 MED ORDER — NICOTINE 14 MG/24HR TD PT24: 14.0000 mg | MEDICATED_PATCH | Freq: Every day | TRANSDERMAL | 0 refills | Status: DC

## 2019-05-11 MED ORDER — NICOTINE 21 MG/24HR TD PT24: 21.0000 mg | MEDICATED_PATCH | Freq: Every day | TRANSDERMAL | 0 refills | Status: DC

## 2019-05-11 MED ORDER — NICOTINE 7 MG/24HR TD PT24: 7.0000 mg | MEDICATED_PATCH | Freq: Every day | TRANSDERMAL | 0 refills | Status: DC

## 2019-05-11 NOTE — Addendum Note (Signed)
Addended by: Kerin Perna on: 05/11/2019 04:06 PM   Modules accepted: Orders, Level of Service
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
## 2019-05-11 NOTE — Patient Instructions (Signed)

## 2019-06-07 ENCOUNTER — Other Ambulatory Visit: Payer: Self-pay

## 2019-06-07 ENCOUNTER — Ambulatory Visit (INDEPENDENT_AMBULATORY_CARE_PROVIDER_SITE_OTHER): Payer: Managed Care, Other (non HMO) | Admitting: Primary Care

## 2019-06-07 ENCOUNTER — Encounter (INDEPENDENT_AMBULATORY_CARE_PROVIDER_SITE_OTHER): Payer: Self-pay | Admitting: Primary Care

## 2019-06-07 VITALS — BP 124/85 | HR 78 | Temp 97.5°F | Ht 70.0 in | Wt 117.0 lb

## 2019-06-07 DIAGNOSIS — I10 Essential (primary) hypertension: Secondary | ICD-10-CM | POA: Diagnosis not present

## 2019-06-07 DIAGNOSIS — J441 Chronic obstructive pulmonary disease with (acute) exacerbation: Secondary | ICD-10-CM

## 2019-06-07 DIAGNOSIS — K921 Melena: Secondary | ICD-10-CM

## 2019-06-07 DIAGNOSIS — M542 Cervicalgia: Secondary | ICD-10-CM

## 2019-06-07 MED ORDER — BLOOD PRESSURE MONITOR KIT: 1.0000 | PACK | Freq: Three times a day (TID) | 0 refills | Status: DC | PRN

## 2019-06-07 NOTE — Progress Notes (Signed)
Presents for  hypertension evaluation, on previous visit medication was not being taken because he was unsure of how it would make him feel. Jack Lester agreed to take take amlodipine 52m , HCTZ 25 mg once in the morning and metoprolol 214mtwice daily. Blood pressure is vastly improved from last visit 155/97 without medication.  Patient adherence with medications.  Current Medication List  Past Medical History  Past Medical History:  Diagnosis Date  . COPD (chronic obstructive pulmonary disease) ([facility]  . Hypertension    Dietary habits include: reduce salt intake  Exercise habits include little to none due to COPD and shortness of breath Family / Social history: none  ASCVD risk factors include- CHMali O:  Physical Exam Vitals signs reviewed.  HENT:     Head: Normocephalic.  Neck:     Musculoskeletal: Normal range of motion.  Cardiovascular:     Rate and Rhythm: Normal rate and regular rhythm.  Pulmonary:     Breath sounds: Wheezing present.  Abdominal:     General: Abdomen is flat.     Palpations: Abdomen is soft.  Musculoskeletal: Normal range of motion.  Skin:    General: Skin is warm and dry.  Neurological:     Mental Status: He is alert and oriented to person, place, and time.  Psychiatric:        Mood and Affect: Mood normal.        Behavior: Behavior normal.      Review of Systems  Respiratory: Positive for cough and shortness of breath.   Cardiovascular: Positive for chest pain.       Every now and than when he totes 30 -40 lbs boxes up ramps causes chest tightness and soreness . Also increased shortness of breath  Gastrointestinal: Positive for blood in stool.  Musculoskeletal: Positive for neck pain.       5/10 took aleve   Neurological: Positive for weakness.       Right leg from calf to ankle  All other systems reviewed and are negative.   Last 3 Office BP readings: BP Readings from Last 3 Encounters:  06/07/19 124/85  05/11/19 (!) 143/96   04/13/19 (!) 136/91    BMET    Component Value Date/Time   NA 139 03/30/2019 1623   K 4.3 03/30/2019 1623   CL 101 03/30/2019 1623   CO2 24 03/30/2019 1623   GLUCOSE 97 03/30/2019 1623   GLUCOSE 81 08/02/2017 1215   BUN 6 03/30/2019 1623   CREATININE 0.98 03/30/2019 1623   CALCIUM 9.7 03/30/2019 1623   GFRNONAA 86 03/30/2019 1623   GFRAA 99 03/30/2019 1623    Renal function: CrCl cannot be calculated (Patient's most recent lab result is older than the maximum 21 days allowed.).  Clinical ASCVD: yes The 10-year ASCVD risk scoreis: 16.6%   Values used to calculate the score:     Age: [age]     Sex: Male     Is Non-Hispanic African American: Yes     Diabetic: No     Tobacco smoker: Yes     Systolic Blood Pressure: 12314mHg     Is BP treated: Yes     HDL Cholesterol: 50 mg/dL     Total Cholesterol: 131 mg/dL   A/P: Hypertension longstanding currently  on amlodipine 1060mQM , HCTZ  33m6mMand metoprolol 33mg37m  current medications. BP Goal has been met less than 130/80 mmHg. Current Bp 124/85  Patient adherent with  current medications.  -Counseled on lifestyle modifications for blood pressure control including reduced dietary sodium, increased exercise, adequate sleep  Neck pain Range of motion pain elicited - pain migrates from left side of neck to left shoulder and down his arms causing numbness and tingling in hands/finger. Refer to orthopedics  Blood in stool Continues to have blood in stool. [facility] called he missed the call gave him the number to schedule appointment.  Other orders-     Blood Pressure Monitor KIT; 1 Bag by Does not apply route 3 (three) times daily as needed.   Results reviewed and written information provided. Total time in face-to-face counseling 20 minutes.

## 2019-06-07 NOTE — Patient Instructions (Signed)

## 2019-06-08 ENCOUNTER — Ambulatory Visit (INDEPENDENT_AMBULATORY_CARE_PROVIDER_SITE_OTHER): Payer: PRIVATE HEALTH INSURANCE | Admitting: Primary Care

## 2019-06-10 ENCOUNTER — Encounter: Payer: Self-pay | Admitting: Physician Assistant

## 2019-06-17 ENCOUNTER — Ambulatory Visit (INDEPENDENT_AMBULATORY_CARE_PROVIDER_SITE_OTHER): Payer: Managed Care, Other (non HMO) | Admitting: Physician Assistant

## 2019-06-17 ENCOUNTER — Other Ambulatory Visit: Payer: Managed Care, Other (non HMO)

## 2019-06-17 ENCOUNTER — Other Ambulatory Visit: Payer: Self-pay

## 2019-06-17 ENCOUNTER — Encounter: Payer: Self-pay | Admitting: Physician Assistant

## 2019-06-17 VITALS — BP 128/94 | HR 95 | Temp 98.7°F | Ht 70.0 in | Wt 115.5 lb

## 2019-06-17 DIAGNOSIS — R194 Change in bowel habit: Secondary | ICD-10-CM

## 2019-06-17 DIAGNOSIS — R5383 Other fatigue: Secondary | ICD-10-CM

## 2019-06-17 DIAGNOSIS — R634 Abnormal weight loss: Secondary | ICD-10-CM

## 2019-06-17 DIAGNOSIS — K625 Hemorrhage of anus and rectum: Secondary | ICD-10-CM | POA: Diagnosis not present

## 2019-06-17 DIAGNOSIS — R197 Diarrhea, unspecified: Secondary | ICD-10-CM | POA: Diagnosis not present

## 2019-06-17 DIAGNOSIS — J441 Chronic obstructive pulmonary disease with (acute) exacerbation: Secondary | ICD-10-CM

## 2019-06-17 MED ORDER — NA SULFATE-K SULFATE-MG SULF 17.5-3.13-1.6 GM/177ML PO SOLN: 1.0000 | Freq: Once | ORAL | 0 refills | Status: AC

## 2019-06-17 NOTE — Patient Instructions (Signed)
You have been scheduled for an endoscopy and colonoscopy. Please follow the written instructions given to you at your visit today. Please pick up your prep supplies at the pharmacy within the next 1-3 days. If you use inhalers (even only as needed), please bring them with you on the day of your procedure.  
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
## 2019-06-21 NOTE — Progress Notes (Signed)
Agree with assessment and plan as outlined.  

## 2019-06-22 ENCOUNTER — Encounter: Payer: Self-pay | Admitting: Gastroenterology

## 2019-06-23 ENCOUNTER — Telehealth: Payer: Self-pay | Admitting: Gastroenterology

## 2019-06-23 NOTE — Telephone Encounter (Signed)

## 2019-06-24 ENCOUNTER — Other Ambulatory Visit: Payer: Self-pay

## 2019-06-24 ENCOUNTER — Ambulatory Visit (AMBULATORY_SURGERY_CENTER): Payer: Managed Care, Other (non HMO) | Admitting: Gastroenterology

## 2019-06-24 ENCOUNTER — Other Ambulatory Visit (INDEPENDENT_AMBULATORY_CARE_PROVIDER_SITE_OTHER): Payer: Managed Care, Other (non HMO)

## 2019-06-24 ENCOUNTER — Telehealth: Payer: Self-pay

## 2019-06-24 ENCOUNTER — Encounter: Payer: Self-pay | Admitting: Gastroenterology

## 2019-06-24 VITALS — BP 135/85 | HR 76 | Temp 98.5°F | Resp 15 | Ht 70.0 in | Wt 115.0 lb

## 2019-06-24 DIAGNOSIS — R634 Abnormal weight loss: Secondary | ICD-10-CM

## 2019-06-24 DIAGNOSIS — K6289 Other specified diseases of anus and rectum: Secondary | ICD-10-CM | POA: Diagnosis not present

## 2019-06-24 DIAGNOSIS — D120 Benign neoplasm of cecum: Secondary | ICD-10-CM

## 2019-06-24 DIAGNOSIS — R197 Diarrhea, unspecified: Secondary | ICD-10-CM

## 2019-06-24 DIAGNOSIS — K2980 Duodenitis without bleeding: Secondary | ICD-10-CM

## 2019-06-24 DIAGNOSIS — K2950 Unspecified chronic gastritis without bleeding: Secondary | ICD-10-CM | POA: Diagnosis not present

## 2019-06-24 DIAGNOSIS — K259 Gastric ulcer, unspecified as acute or chronic, without hemorrhage or perforation: Secondary | ICD-10-CM | POA: Diagnosis not present

## 2019-06-24 DIAGNOSIS — D125 Benign neoplasm of sigmoid colon: Secondary | ICD-10-CM | POA: Diagnosis not present

## 2019-06-24 DIAGNOSIS — B370 Candidal stomatitis: Secondary | ICD-10-CM

## 2019-06-24 DIAGNOSIS — K625 Hemorrhage of anus and rectum: Secondary | ICD-10-CM

## 2019-06-24 DIAGNOSIS — R630 Anorexia: Secondary | ICD-10-CM

## 2019-06-24 DIAGNOSIS — D128 Benign neoplasm of rectum: Secondary | ICD-10-CM | POA: Diagnosis not present

## 2019-06-24 DIAGNOSIS — B9681 Helicobacter pylori [H. pylori] as the cause of diseases classified elsewhere: Secondary | ICD-10-CM | POA: Diagnosis not present

## 2019-06-24 DIAGNOSIS — B3749 Other urogenital candidiasis: Secondary | ICD-10-CM

## 2019-06-24 DIAGNOSIS — D124 Benign neoplasm of descending colon: Secondary | ICD-10-CM

## 2019-06-24 DIAGNOSIS — B3781 Candidal esophagitis: Secondary | ICD-10-CM

## 2019-06-24 LAB — BASIC METABOLIC PANEL
BUN: 12 mg/dL (ref 6–23)
CO2: 29 mEq/L (ref 19–32)
Calcium: 9.2 mg/dL (ref 8.4–10.5)
Chloride: 93 mEq/L — ABNORMAL LOW (ref 96–112)
Creatinine, Ser: 1 mg/dL (ref 0.40–1.50)
GFR: 93.16 mL/min (ref >60.00)
Glucose, Bld: 92 mg/dL (ref 70–99)
Potassium: 3.4 mEq/L — ABNORMAL LOW (ref 3.5–5.1)
Sodium: 132 mEq/L — ABNORMAL LOW (ref 135–145)

## 2019-06-24 MED ORDER — FLUCONAZOLE 50 MG PO TABS: 200.0000 mg | ORAL_TABLET | Freq: Every day | ORAL | Status: DC

## 2019-06-24 MED ORDER — OMEPRAZOLE 40 MG PO CPDR: 40.0000 mg | DELAYED_RELEASE_CAPSULE | Freq: Every day | ORAL | 3 refills | Status: DC

## 2019-06-24 MED ORDER — SODIUM CHLORIDE 0.9 % IV SOLN: 500.0000 mL | Freq: Once | INTRAVENOUS | Status: DC

## 2019-06-24 MED ORDER — FLUCONAZOLE 200 MG PO TABS: 200.0000 mg | ORAL_TABLET | Freq: Every day | ORAL | 0 refills | Status: AC

## 2019-06-24 NOTE — Progress Notes (Signed)
Called to room to assist during endoscopic procedure.  Patient ID and intended procedure confirmed with present staff. Received instructions for my participation in the procedure from the performing physician.  

## 2019-06-24 NOTE — Progress Notes (Signed)
Reviewed results with pt and discussed future CT. Inofrmed pt and fiance that the office will call him to schedule CT. Two bottles of contrast is sent home with pt. Pt went down to lab upon discharge to have blood drawn .

## 2019-06-24 NOTE — Progress Notes (Signed)
A and O x3. Report to RN. Tolerated MAC anesthesia well.Teeth unchanged after procedure.

## 2019-06-24 NOTE — Op Note (Signed)
[facility] Patient Name: Jack Lester Procedure Date: 06/24/2019 2:41 PM MRN: [id number] Endoscopist: Remo Lipps P. [name] , MD Age: 58 Referring MD:  Date of Birth: 15-Jun-1961 Gender: Male Account #: [id number] Procedure:                Upper GI endoscopy Indications:              Anorexia, Weight loss, persistent diarrhea Medicines:                Monitored Anesthesia Care Procedure:                Pre-Anesthesia Assessment:                           - Prior to the procedure, a History and Physical                            was performed, and patient medications and                            allergies were reviewed. The patient's tolerance of                            previous anesthesia was also reviewed. The risks                            and benefits of the procedure and the sedation                            options and risks were discussed with the patient.                            All questions were answered, and informed consent                            was obtained. Prior Anticoagulants: The patient has                            taken no previous anticoagulant or antiplatelet                            agents. ASA Grade Assessment: II - A patient with                            mild systemic disease. After reviewing the risks                            and benefits, the patient was deemed in                            satisfactory condition to undergo the procedure.                           After obtaining informed consent, the endoscope was  passed under direct vision. Throughout the                            procedure, the patient's blood pressure, pulse, and                            oxygen saturations were monitored continuously. The                            Endoscope was introduced through the mouth, and                            advanced to the second part of duodenum. The upper                            GI  endoscopy was accomplished without difficulty.                            The patient tolerated the procedure well. Scope In: Scope Out: Findings:                 Esophagogastric landmarks were identified: the                            Z-line was found at 45 cm, the gastroesophageal                            junction was found at 45 cm and the upper extent of                            the gastric folds was found at 45 cm from the                            incisors.                           Diffuse, white plaques were found in the middle                            third of the esophagus and in the lower third of                            the esophagus grossly consistent with esophageal                            candiasis.                           The exam of the esophagus was otherwise normal.                           One non-bleeding superficial gastric ulcer with no  stigmata of bleeding was found in the gastric                            antrum. The lesion was 3 mm in largest dimension.                           Patchy moderate inflammation characterized by                            congestion (edema), erythema and friability was                            found in the gastric antrum.                           The exam of the stomach was otherwise normal.                           Biopsies were taken with a cold forceps in the                            gastric body, at the incisura and in the gastric                            antrum for histology.                           Patchy mildly erythematous mucosa was found in the                            duodenal bulb.                           Diffuse atrophic appearing / flattened mucosa,                            slightly edematous in apperance, was found in the                            duodenal bulb and in the second portion of the                            duodenum,. Biopsies were taken with a  cold forceps                            for histology. Complications:            No immediate complications. Estimated blood loss:                            Minimal. Estimated Blood Loss:     Estimated blood loss was minimal. Impression:               - Esophagogastric landmarks identified.                           -  Esophageal plaques were found, consistent with                            candidiasis.                           - Non-bleeding gastric ulcer with no stigmata of                            bleeding.                           - Gastritis. Biopsies obtained                           - Erythematous duodenopathy.                           - Flattened / edematous mucosa of the duodenum.                            Biopsied. Recommendation:           - Patient has a contact number available for                            emergencies. The signs and symptoms of potential                            delayed complications were discussed with the                            patient. Return to normal activities tomorrow.                            Written discharge instructions were provided to the                            patient.                           - Resume previous diet.                           - Continue present medications.                           - Start Fluconazole 400mg  PO on day one, then 200mg                             / day for the next 13 days                           - Start omeprazole 40mg  once daily, avoid NSAIDs                           - Await pathology results with further  recommendations Carlota Raspberry. Windsor Zirkelbach, MD 06/24/2019 3:54:20 PM This report has been signed electronically.

## 2019-06-24 NOTE — Patient Instructions (Signed)
Handouts given : polyps  Start Fluconazole 400mg  first day., then 200mg  daily for the next 13 days  Start Omeprazole 40mg  once daily  Avoid all NSAIDS: do not take aspirin, ibuprofen, aleve, naproxen mortrin  Office will call to schedule CT scan- went home with 2 bottles of contrast     YOU HAD AN ENDOSCOPIC PROCEDURE TODAY AT [facility]:   Refer to the procedure report that was given to you for any specific questions about what was found during the examination.  If the procedure report does not answer your questions, please call your gastroenterologist to clarify.  If you requested that your care partner not be given the details of your procedure findings, then the procedure report has been included in a sealed envelope for you to review at your convenience later.  YOU SHOULD EXPECT: Some feelings of bloating in the abdomen. Passage of more gas than usual.  Walking can help get rid of the air that was put into your GI tract during the procedure and reduce the bloating. If you had a lower endoscopy (such as a colonoscopy or flexible sigmoidoscopy) you may notice spotting of blood in your stool or on the toilet paper. If you underwent a bowel prep for your procedure, you may not have a normal bowel movement for a few days.  Please Note:  You might notice some irritation and congestion in your nose or some drainage.  This is from the oxygen used during your procedure.  There is no need for concern and it should clear up in a day or so.  SYMPTOMS TO REPORT IMMEDIATELY:   Following lower endoscopy (colonoscopy or flexible sigmoidoscopy):  Excessive amounts of blood in the stool  Significant tenderness or worsening of abdominal pains  Swelling of the abdomen that is new, acute  Fever of 100F or higher   Following upper endoscopy (EGD)  Vomiting of blood or coffee ground material  New chest pain or pain under the shoulder blades  Painful or persistently difficult  swallowing  New shortness of breath  Fever of 100F or higher  Black, tarry-looking stools  For urgent or emergent issues, a gastroenterologist can be reached at any hour by calling [phone].   DIET:  We do recommend a small meal at first, but then you may proceed to your regular diet.  Drink plenty of fluids but you should avoid alcoholic beverages for 24 hours.  ACTIVITY:  You should plan to take it easy for the rest of today and you should NOT DRIVE or use heavy machinery until tomorrow (because of the sedation medicines used during the test).    FOLLOW UP: Our staff will call the number listed on your records 48-72 hours following your procedure to check on you and address any questions or concerns that you may have regarding the information given to you following your procedure. If we do not reach you, we will leave a message.  We will attempt to reach you two times.  During this call, we will ask if you have developed any symptoms of COVID 19. If you develop any symptoms (ie: fever, flu-like symptoms, shortness of breath, cough etc.) before then, please call [phone].  If you test positive for Covid 19 in the 2 weeks post procedure, please call and report this information to [place].    If any biopsies were taken you will be contacted by phone or by letter within the next 1-3 weeks.  Please call us at [phone]  [phone] if you have not heard about the biopsies in 3 weeks.    SIGNATURES/CONFIDENTIALITY: You and/or your care partner have signed paperwork which will be entered into your electronic medical record.  These signatures attest to the fact that that the information above on your After Visit Summary has been reviewed and is understood.  Full responsibility of the confidentiality of this discharge information lies with you and/or your care-partner.

## 2019-06-24 NOTE — Progress Notes (Signed)
Ct-abd/pe

## 2019-06-24 NOTE — Telephone Encounter (Signed)
Order in [facility] for CT-abd/pelvis & chest. Also order for BMET. Patient received contrast in the [facility] and going to our lab today for his BMET.

## 2019-06-24 NOTE — Telephone Encounter (Signed)
-----   Message from Yetta Flock, MD sent at 06/24/2019  3:56 PM EDT ----- Regarding: CT chest / abdomen / pelvis Hi Orli Degrave,  This patient has a rectal mass on colonoscopy today. Can you call them tomorrow to coordinate CT chest / abdomen / pelvis as soon as possible, for staging? Thanks

## 2019-06-24 NOTE — Op Note (Signed)
[facility] Patient Name: Jack Lester Procedure Date: 06/24/2019 2:41 PM MRN: [id number] Endoscopist: Remo Lipps P. [name] , MD Age: 58 Referring MD:  Date of Birth: [birth year] Gender: Male Account #: [id number] Procedure:                Colonoscopy Indications:              Rectal bleeding, Diarrhea, Weight loss Medicines:                Monitored Anesthesia Care Procedure:                Pre-Anesthesia Assessment:                           - Prior to the procedure, a History and Physical                            was performed, and patient medications and                            allergies were reviewed. The patient's tolerance of                            previous anesthesia was also reviewed. The risks                            and benefits of the procedure and the sedation                            options and risks were discussed with the patient.                            All questions were answered, and informed consent                            was obtained. Prior Anticoagulants: The patient has                            taken no previous anticoagulant or antiplatelet                            agents. ASA Grade Assessment: II - A patient with                            mild systemic disease. After reviewing the risks                            and benefits, the patient was deemed in                            satisfactory condition to undergo the procedure.                           After obtaining informed consent, the colonoscope  was passed under direct vision. Throughout the                            procedure, the patient's blood pressure, pulse, and                            oxygen saturations were monitored continuously. The                            Colonoscope was introduced through the anus and                            advanced to the the terminal ileum, with                            identification of the  appendiceal orifice and IC                            valve. The colonoscopy was performed without                            difficulty. The patient tolerated the procedure                            well. The quality of the bowel preparation was                            adequate. The terminal ileum, ileocecal valve,                            appendiceal orifice, and rectum were photographed. Scope In: 2:59:25 PM Scope Out: 3:33:24 PM Scope Withdrawal Time: 0 hours 26 minutes 17 seconds  Total Procedure Duration: 0 hours 33 minutes 59 seconds  Findings:                 The digital rectal exam revealed a suspected rectal                            mass.                           The terminal ileum appeared normal.                           A 6 mm polyp was found in the ileocecal valve. The                            polyp was sessile, it did not invade the valve. The                            polyp was removed with a cold snare. Resection and                            retrieval were complete.  A 7 mm polyp was found in the descending colon. The                            polyp was semi-pedunculated. The polyp was removed                            with a cold snare. Resection and retrieval were                            complete.                           A diminutive polyp was found in the sigmoid colon.                            The polyp was sessile. The polyp was removed with a                            cold snare. Resection and retrieval were complete.                           A 4 mm polyp was found in the rectum. The polyp was                            sessile. The polyp was removed with a cold snare.                            Resection and retrieval were complete.                           A fungating non-obstructing mass a few cm's in size                            was found in the mid to distal rectum, roughly                             within 4-6cm of the anal verge or so. The mass was                            partially circumferential (involving roughly                            one-half of the lumen circumference) but was not                            obstructing the lumen. Biopsies were taken with a                            cold forceps for histology. Areas distal to the                            mass were tattooed with an injection of  Spot                            (carbon black).                           Internal hemorrhoids were found. Retroflexed views                            not obtained due to the mass.                           The exam was otherwise without abnormality.                           Biopsies for histology were taken with a cold                            forceps from the right colon, left colon and                            transverse colon for evaluation of microscopic                            colitis. Complications:            No immediate complications. Estimated blood loss:                            Minimal. Estimated Blood Loss:     Estimated blood loss was minimal. Impression:               - The examined portion of the ileum was normal.                           - One 6 mm polyp at the ileocecal valve, removed                            with a cold snare. Resected and retrieved.                           - One 7 mm polyp in the descending colon, removed                            with a cold snare. Resected and retrieved.                           - One diminutive polyp in the sigmoid colon,                            removed with a cold snare. Resected and retrieved.                           - One 4 mm polyp in the rectum, removed with a cold  snare. Resected and retrieved.                           - Mass in the mid to distal rectum, concerning for                            malignancy. Biopsied. Tattooed.                           - Internal  hemorrhoids.                           - The examination was otherwise normal.                           - Biopsies were taken with a cold forceps from the                            right colon, left colon and transverse colon for                            evaluation of microscopic colitis given symptoms of                            diarrhea. Recommendation:           - Patient has a contact number available for                            emergencies. The signs and symptoms of potential                            delayed complications were discussed with the                            patient. Return to normal activities tomorrow.                            Written discharge instructions were provided to the                            patient.                           - Resume previous diet.                           - Continue present medications.                           - Await pathology results, hopefully back in the                            next few days.                           - CT scan of  the chest / abdomen / pelvis with                            contrast to further evaluate Remo Lipps P. Armbruster, MD 06/24/2019 3:46:45 PM This report has been signed electronically.

## 2019-06-25 ENCOUNTER — Telehealth: Payer: Self-pay

## 2019-06-25 NOTE — Telephone Encounter (Signed)
The pt has been advised about the CT scan and he was instructed.  He has the contrast at home. The pt has been advised of the information and verbalized understanding.      You have been scheduled for a CT scan of the Chest abdomen and pelvis at [facility]    y. You are scheduled on 06/29/19 at 4 pm. You should arrive 15 minutes prior to your appointment time for registration. Please follow the written instructions below on the day of your exam:  WARNING: IF YOU ARE ALLERGIC TO IODINE/X-RAY DYE, PLEASE NOTIFY RADIOLOGY IMMEDIATELY AT [phone]! YOU WILL BE GIVEN A 13 HOUR PREMEDICATION PREP.  1) Do not eat or drink anything after 12 noon (4 hours prior to your test) 2) You have been given 2 bottles of oral contrast to drink. The solution may taste better if refrigerated, but do NOT add ice or any other liquid to this solution. Shake well before drinking.   Drink 1 bottle of contrast @ 2 pm (2 hours prior to your exam) Drink 1 bottle of contrast @ 3 pm (1 hour prior to your exam)  You may take any medications as prescribed with a small amount of water, if necessary. If you take any of the following medications: METFORMIN, GLUCOPHAGE, GLUCOVANCE, AVANDAMET, RIOMET, FORTAMET, [place] MET, JANUMET, GLUMETZA or METAGLIP, you MAY be asked to HOLD this medication 48 hours AFTER the exam.  The purpose of you drinking the oral contrast is to aid in the visualization of your intestinal tract. The contrast solution may cause some diarrhea. Depending on your individual set of symptoms, you may also receive an intravenous injection of x-ray contrast/dye. Plan on being at [facility] for 30 minutes or longer, depending on the type of exam you are having performed.  This test typically takes 30-45 minutes to complete.

## 2019-06-25 NOTE — Telephone Encounter (Signed)
-----   Message from Yetta Flock, MD sent at 06/25/2019  1:10 PM EDT ----- Regarding: RE: new likely rectal cancer Great thanks! ----- Message ----- From: Arna Snipe, RN Sent: 06/25/2019   1:05 PM EDT To: Hayden Pedro, PA-C, # Subject: RE: new likely rectal cancer                   Be glad to!  I'll get him on the list for 10/28 and give him a call.  Alby Schwabe  ----- Message ----- From: Yetta Flock, MD Sent: 06/25/2019  12:52 PM EDT To: Arna Snipe, RN Subject: new likely rectal cancer                       Hi Lemar Bakos,  This patient had a colonoscopy yesterday with a large rectal mass. 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 He has a staging CT Scan pending for next week. I'd like his case discussed at the GI cancer conference if you could add that on. I've referred him to general surgery, likely warrants oncology and radiation oncology consultation as well. Would you mind giving him a call at your convenience?   Thanks much for your help. Richardson Landry

## 2019-06-25 NOTE — Telephone Encounter (Signed)
Called patient at the request of Dr. [name] to provide my name and contact information as GI Navigator. Left Vm encouraging him to return my call to establish contact.

## 2019-06-25 NOTE — Telephone Encounter (Signed)
Patient returned my call and we discussed next steps. He agreed to reach out to me with questions or concerns. Will follow as needed.

## 2019-06-25 NOTE — Telephone Encounter (Signed)
-----   Message from Yetta Flock, MD sent at 06/24/2019  3:56 PM EDT ----- Regarding: CT chest / abdomen / pelvis Hi Esther,  This patient has a rectal mass on colonoscopy today. Can you call them tomorrow to coordinate CT chest / abdomen / pelvis as soon as possible, for staging? Thanks

## 2019-06-28 ENCOUNTER — Other Ambulatory Visit: Payer: Self-pay

## 2019-06-28 ENCOUNTER — Telehealth: Payer: Self-pay

## 2019-06-28 DIAGNOSIS — K6289 Other specified diseases of anus and rectum: Secondary | ICD-10-CM

## 2019-06-28 MED ORDER — METRONIDAZOLE 500 MG PO TABS: 500.0000 mg | ORAL_TABLET | Freq: Two times a day (BID) | ORAL | 0 refills | Status: DC

## 2019-06-28 MED ORDER — OMEPRAZOLE 40 MG PO CPDR: 40.0000 mg | DELAYED_RELEASE_CAPSULE | Freq: Two times a day (BID) | ORAL | 0 refills | Status: DC

## 2019-06-28 MED ORDER — CLARITHROMYCIN 500 MG PO TABS: 500.0000 mg | ORAL_TABLET | Freq: Two times a day (BID) | ORAL | 0 refills | Status: DC

## 2019-06-28 MED ORDER — AMOXICILLIN 500 MG PO TABS: 1000.0000 mg | ORAL_TABLET | Freq: Two times a day (BID) | ORAL | 0 refills | Status: DC

## 2019-06-28 NOTE — Telephone Encounter (Signed)
  Follow up Call-  Call back number 06/24/2019  Post procedure Call Back phone  # [phone]  Permission to leave phone message Yes  Some recent data might be hidden     Patient questions:  Do you have a fever, pain , or abdominal swelling? No. Pain Score  0 *  Have you tolerated food without any problems? Yes.    Have you been able to return to your normal activities? Yes.    Do you have any questions about your discharge instructions: Diet   No. Medications  No. Follow up visit  No.  Do you have questions or concerns about your Care? No.  Actions: * If pain score is 4 or above: No action needed, pain <4.  1. Have you developed a fever since your procedure? no  2.   Have you had an respiratory symptoms (SOB or cough) since your procedure? no  3.   Have you tested positive for COVID 19 since your procedure no  4.   Have you had any family members/close contacts diagnosed with the COVID 19 since your procedure?  no   If yes to any of these questions please route to Joylene John, RN and Alphonsa Gin, Therapist, sports.

## 2019-06-29 ENCOUNTER — Other Ambulatory Visit: Payer: Managed Care, Other (non HMO)

## 2019-06-29 ENCOUNTER — Telehealth: Payer: Self-pay

## 2019-06-29 ENCOUNTER — Ambulatory Visit (HOSPITAL_COMMUNITY)
Admission: RE | Admit: 2019-06-29 | Discharge: 2019-06-29 | Disposition: A | Discharge status: Home / Self Care | Payer: Managed Care, Other (non HMO) | Source: Ambulatory Visit | Attending: Gastroenterology | Admitting: Gastroenterology

## 2019-06-29 ENCOUNTER — Encounter (HOSPITAL_COMMUNITY): Payer: Self-pay

## 2019-06-29 ENCOUNTER — Other Ambulatory Visit: Payer: Self-pay

## 2019-06-29 DIAGNOSIS — K6289 Other specified diseases of anus and rectum: Secondary | ICD-10-CM

## 2019-06-29 MED ORDER — IOHEXOL 300 MG/ML  SOLN
100.0000 mL | Freq: Once | INTRAMUSCULAR | Status: AC | PRN
  Administered 2019-06-29: 100 mL via INTRAVENOUS

## 2019-06-29 MED ORDER — SODIUM CHLORIDE (PF) 0.9 % IJ SOLN
INTRAMUSCULAR | Status: AC
  Filled 2019-06-29: qty 50

## 2019-06-29 NOTE — Telephone Encounter (Signed)
-----   Message from [place] sent at 06/29/2019  9:04 AM EDT ----- Regarding: denied This pt's ct chest was denied MRI CHEST 71260 Based on [employer] Chest Imaging Guidelines Section(s): CH 10.1 COPD, we cannot approve this request. Your records show that you may have chronic lung disease that causes decreased airflow from the lungs. The reason this request cannot be approved is because: 1. A recent chest x-ray is supported within the last 60 days for the clinical indication(s) presented. The clinical information provided does not describe these results and, therefore, the request is not indicated at this time.    Dr [name] can do a peer to peer  [phone] follow prompts to do a peer to peer

## 2019-06-29 NOTE — Telephone Encounter (Signed)
Attempted to call patient to make sure Ins had contacted him. Left voice mail to please call back.

## 2019-06-29 NOTE — Telephone Encounter (Signed)
Left message for patient to please call back, reference his CT for today

## 2019-06-29 NOTE — Telephone Encounter (Signed)
Ins. Co to contact pt. CT of abd/pelvis still scheduled for today

## 2019-06-29 NOTE — Telephone Encounter (Signed)
That is unfortunate. He has suspected rectal cancer and CT chest is done for staging. Too late to do peer to peer today given his exam is in a few hours. Will do the abdomen / pelvis and work on doing the chest at another time pending results. Thanks

## 2019-06-29 NOTE — Telephone Encounter (Signed)
-----   Message from [place] sent at 06/29/2019  9:04 AM EDT ----- Regarding: denied This pt's ct chest was denied MRI CHEST 71260 Based on [employer] Chest Imaging Guidelines Section(s): CH 10.1 COPD, we cannot approve this request. Your records show that you may have chronic lung disease that causes decreased airflow from the lungs. The reason this request cannot be approved is because: 1. A recent chest x-ray is supported within the last 60 days for the clinical indication(s) presented. The clinical information provided does not describe these results and, therefore, the request is not indicated at this time.    Dr [name] can do a peer to peer  [phone] follow prompts to do a peer to peer

## 2019-06-29 NOTE — Telephone Encounter (Signed)
-----   Message from [place] Slider sent at 06/29/2019  9:30 AM EDT ----- Regarding: RE: denied Abd/pel is approved insurance is trying to reach him before they give me an aith number But it is approved ----- Message ----- From: Hughie Closs, RN Sent: 06/29/2019   9:28 AM EDT To: [place] Slider Subject: RE: denied                                     Hi esmeralda,   so is the CT of the pelvis approved ?Marland Kitchen He is scheduled at 4:00pm today.  Thanks  Sherlynn Stalls ----- Message ----- From: Ronalee Red I Sent: 06/29/2019   9:04 AM EDT To: Hughie Closs, RN Subject: denied                                         This pt's ct chest was denied MRI CHEST 71260 Based on [employer] Chest Imaging Guidelines Section(s): CH 10.1 COPD, we cannot approve this request. Your records show that you may have chronic lung disease that causes decreased airflow from the lungs. The reason this request cannot be approved is because: 1. A recent chest x-ray is supported within the last 60 days for the clinical indication(s) presented. The clinical information provided does not describe these results and, therefore, the request is not indicated at this time.    Dr [name] can do a peer to peer  [phone] follow prompts to do a peer to peer

## 2019-06-30 ENCOUNTER — Telehealth: Payer: Self-pay | Admitting: Gastroenterology

## 2019-06-30 ENCOUNTER — Other Ambulatory Visit: Payer: Self-pay

## 2019-06-30 ENCOUNTER — Telehealth: Payer: Self-pay | Admitting: Internal Medicine

## 2019-06-30 ENCOUNTER — Encounter: Payer: Self-pay | Admitting: Gastroenterology

## 2019-06-30 ENCOUNTER — Encounter: Payer: Self-pay | Admitting: *Deleted

## 2019-06-30 DIAGNOSIS — K6289 Other specified diseases of anus and rectum: Secondary | ICD-10-CM

## 2019-06-30 DIAGNOSIS — R918 Other nonspecific abnormal finding of lung field: Secondary | ICD-10-CM

## 2019-06-30 LAB — CEA: CEA: 803.7 ng/mL — ABNORMAL HIGH

## 2019-06-30 LAB — HIV ANTIBODY (ROUTINE TESTING W REFLEX): HIV 1&2 Ab, 4th Generation: NONREACTIVE

## 2019-06-30 NOTE — Telephone Encounter (Signed)
Scheduled PET (whole body) and CT-chest, but when I went to order CT-abd/pelvis it said one was done yesterday. Did you want another one done. Please advise

## 2019-06-30 NOTE — Telephone Encounter (Signed)
A new patient appt has been scheduled for Mr. Hamre to see Dr. Julien Nordmann on 11/3 at 2:15pm w/labs at 1:45pm. I cld and lft the appt date and time on the pt's vm. Thoracic navigator updated.

## 2019-06-30 NOTE — Telephone Encounter (Signed)
Jack Lester His case was discussed at GI conference this AM.  Possibly two primary tumors (rectal and lung).  Recommend PET/CT scan, heme/onc referral ([place] [place], [place]) and flex sig to repeat biopsy of the rectal tumor to prove adenocarcinoma.  Thanks  DJ

## 2019-06-30 NOTE — Progress Notes (Signed)
Oncology Nurse Navigator Documentation  Oncology Nurse Navigator Flowsheets 06/30/2019  Navigator Location [facility]  Navigator Encounter Type Other/I received referral for Ms. Mcconaghy to see Dr. Julien Nordmann.  I updated Seth Bake to call patient and schedule him to be seen on 11/3.   Telephone -  Treatment Phase Abnormal Scans  Barriers/Navigation Needs Coordination of Care  Interventions Coordination of Care  Coordination of Care Other  Acuity Level 2-Minimal Needs (1-2 Barriers Identified)  Time Spent with Patient 30

## 2019-06-30 NOTE — Telephone Encounter (Signed)
Thanks Customer service manager. No do not need to order the CT abdomen / pelvis. I think the whole body PET will be enough. Thanks

## 2019-06-30 NOTE — Telephone Encounter (Signed)
Thanks a Academic librarian.  I called the patient, relayed results of CT and recommendations. He is willing to proceed.  Sherlynn Stalls can you help coordinate the following: - he needs a flex sig done ASAP. I have openings on Monday 11/2 for flex sig if you can book him for that date, he said he is available then - he needs a PET / CT scan done ASAP- workup for rectal mass and lung mass, if you can coordinate - can you refer him to Oncology, physicians as outlined below.   Thank you

## 2019-07-01 ENCOUNTER — Other Ambulatory Visit: Payer: Self-pay

## 2019-07-01 ENCOUNTER — Telehealth: Payer: Self-pay | Admitting: Gastroenterology

## 2019-07-01 DIAGNOSIS — R918 Other nonspecific abnormal finding of lung field: Secondary | ICD-10-CM

## 2019-07-01 DIAGNOSIS — K6289 Other specified diseases of anus and rectum: Secondary | ICD-10-CM

## 2019-07-01 NOTE — Telephone Encounter (Signed)
Called patient back and he states he has been hurting for a while now. I asked if he had called his PCP to ask for pain med and he said no. States she was suppose to prescribe something, but thinks she forgot. I asked him to call his PCP and see if they will,  and if not to call us back.

## 2019-07-01 NOTE — Telephone Encounter (Signed)
Patient scheduled for Flex Sig in [place] on 07/05/19. To arrive at 8:30am. Reviewed instructions on the phone (not enough time to mail). Oncology referral made and patient notified they would be calling him for an appt.  COVID testing on 07/01/29 at 3:20pm (short notice on urgent case, OK to do COVID on that day per [facility]). At [address]. CT-chest and PET scheduled for 07/08/19 at [facility]. Patient to arrive at 12:45pm and No Carbs 12 hours before, and only water 6 hours before.

## 2019-07-02 ENCOUNTER — Other Ambulatory Visit: Payer: Self-pay | Admitting: Gastroenterology

## 2019-07-02 LAB — SARS CORONAVIRUS 2 (TAT 6-24 HRS): SARS Coronavirus 2: NEGATIVE

## 2019-07-05 ENCOUNTER — Ambulatory Visit (AMBULATORY_SURGERY_CENTER): Payer: Managed Care, Other (non HMO) | Admitting: Gastroenterology

## 2019-07-05 ENCOUNTER — Other Ambulatory Visit: Payer: Self-pay

## 2019-07-05 ENCOUNTER — Encounter: Payer: Self-pay | Admitting: Gastroenterology

## 2019-07-05 ENCOUNTER — Other Ambulatory Visit: Payer: Self-pay | Admitting: *Deleted

## 2019-07-05 VITALS — BP 109/73 | HR 72 | Temp 97.5°F | Resp 22 | Ht 70.0 in | Wt 115.0 lb

## 2019-07-05 DIAGNOSIS — C20 Malignant neoplasm of rectum: Secondary | ICD-10-CM

## 2019-07-05 DIAGNOSIS — R918 Other nonspecific abnormal finding of lung field: Secondary | ICD-10-CM

## 2019-07-05 DIAGNOSIS — K6289 Other specified diseases of anus and rectum: Secondary | ICD-10-CM

## 2019-07-05 MED ORDER — SODIUM CHLORIDE 0.9 % IV SOLN: 500.0000 mL | Freq: Once | INTRAVENOUS | Status: DC

## 2019-07-05 NOTE — Progress Notes (Signed)
A and O x3. Report to RN. Tolerated MAC anesthesia well.

## 2019-07-05 NOTE — Progress Notes (Signed)
Called to room to assist during endoscopic procedure.  Patient ID and intended procedure confirmed with present staff. Received instructions for my participation in the procedure from the performing physician.  

## 2019-07-05 NOTE — Patient Instructions (Signed)
Await pathology results - hopefully in 24-48 hours.  Proceed with PET/CT as previously recommended - scheduled for later this week.  YOU HAD AN ENDOSCOPIC PROCEDURE TODAY AT [facility]:   Refer to the procedure report that was given to you for any specific questions about what was found during the examination.  If the procedure report does not answer your questions, please call your gastroenterologist to clarify.  If you requested that your care partner not be given the details of your procedure findings, then the procedure report has been included in a sealed envelope for you to review at your convenience later.  YOU SHOULD EXPECT: Some feelings of bloating in the abdomen. Passage of more gas than usual.  Walking can help get rid of the air that was put into your GI tract during the procedure and reduce the bloating. If you had a lower endoscopy (such as a colonoscopy or flexible sigmoidoscopy) you may notice spotting of blood in your stool or on the toilet paper. If you underwent a bowel prep for your procedure, you may not have a normal bowel movement for a few days.  Please Note:  You might notice some irritation and congestion in your nose or some drainage.  This is from the oxygen used during your procedure.  There is no need for concern and it should clear up in a day or so.  SYMPTOMS TO REPORT IMMEDIATELY:   Following lower endoscopy (colonoscopy or flexible sigmoidoscopy):  Excessive amounts of blood in the stool  Significant tenderness or worsening of abdominal pains  Swelling of the abdomen that is new, acute  Fever of 100F or higher    For urgent or emergent issues, a gastroenterologist can be reached at any hour by calling [phone].   DIET:  We do recommend a small meal at first, but then you may proceed to your regular diet.  Drink plenty of fluids but you should avoid alcoholic beverages for 24 hours.  ACTIVITY:  You should plan to take it easy for  the rest of today and you should NOT DRIVE or use heavy machinery until tomorrow (because of the sedation medicines used during the test).    FOLLOW UP: Our staff will call the number listed on your records 48-72 hours following your procedure to check on you and address any questions or concerns that you may have regarding the information given to you following your procedure. If we do not reach you, we will leave a message.  We will attempt to reach you two times.  During this call, we will ask if you have developed any symptoms of COVID 19. If you develop any symptoms (ie: fever, flu-like symptoms, shortness of breath, cough etc.) before then, please call [phone].  If you test positive for Covid 19 in the 2 weeks post procedure, please call and report this information to [place].    If any biopsies were taken you will be contacted by phone or by letter within the next 1-3 weeks.  Please call us at [phone] if you have not heard about the biopsies in 3 weeks.    SIGNATURES/CONFIDENTIALITY: You and/or your care partner have signed paperwork which will be entered into your electronic medical record.  These signatures attest to the fact that that the information above on your After Visit Summary has been reviewed and is understood.  Full responsibility of the confidentiality of this discharge information lies with you and/or your care-partner.

## 2019-07-05 NOTE — Progress Notes (Addendum)
LC - Temp  CW/KA - Temp  Pt last drank water 8 oz at 0810 this am.  Randall Hiss, RN reported this to Dr. [name] and CRNA in his room.  Pt was notified we will take the pt after him on the schedule before him d/t drank at 08:10. maw

## 2019-07-05 NOTE — Op Note (Signed)
Jack Lester Procedure Date: 07/05/2019 10:24 AM MRN: [id number] Endoscopist: Jack Lester , MD Age: 58 Referring MD:  Date of Birth: July 02, 1961 Gender: Male Account #: [id number] Procedure:                Flexible Sigmoidoscopy Indications:              Rectal mass, biopsies from initial exam show "high                            grade dysplasia" and no definitive malignancy. CEA                            level markedly elevated (>800). Lung mass noted on                            imaging. Repeat flex sig done to get more tissue to                            clarify diagnosis. Medicines:                Monitored Anesthesia Care Procedure:                Pre-Anesthesia Assessment:                           - Prior to the procedure, a History and Physical                            was performed, and patient medications and                            allergies were reviewed. The patient's tolerance of                            previous anesthesia was also reviewed. The risks                            and benefits of the procedure and the sedation                            options and risks were discussed with the patient.                            All questions were answered, and informed consent                            was obtained. Prior Anticoagulants: The patient has                            taken no previous anticoagulant or antiplatelet                            agents. ASA Grade Assessment: III - A patient with  severe systemic disease. After reviewing the risks                            and benefits, the patient was deemed in                            satisfactory condition to undergo the procedure.                           After obtaining informed consent, the scope was                            passed under direct vision. The Colonoscope was                            introduced through the  anus and advanced to the the                            sigmoid colon. The flexible sigmoidoscopy was                            accomplished without difficulty. The patient                            tolerated the procedure well. The quality of the                            bowel preparation was adequate. Scope In: Scope Out: Findings:                 The digital rectal exam revealed a rectal mass.                           The distal sigmoid colon appeared normal.                           A fungating non-obstructing mass was found in the                            rectum. The mass was partially circumferential                            (involving one-half of the lumen circumference) and                            was a few cm in size, located within 4-6cm from the                            anal verge. Prior tattoo noted circumferentially                            inferior to the mass. Multiple biopsies were taken  from the mass with a cold forceps for histology.                            The rectum was otherwise normal. Retroflexed views                            not obtained. Complications:            No immediate complications. Estimated blood loss:                            Minimal. Estimated Blood Loss:     Estimated blood loss was minimal. Impression:               - The distal sigmoid colon is normal.                           - Rectal mass in the rectum as outlined which is                            malignant appearing. Numerous biopsies obtained                            today given inconclusive biopsies from initial exam. Recommendation:           - Discharge patient to home.                           - Resume previous diet.                           - Continue present medications.                           - Await pathology results, hopefully back in 24-48                            hours                           - Proceed with PET CT as  previously recommended                            (scheduled for later this week) Jack Lipps P. Jack Voisin, MD 07/05/2019 10:56:13 AM This report has been signed electronically.

## 2019-07-06 ENCOUNTER — Ambulatory Visit: Payer: Managed Care, Other (non HMO) | Admitting: Internal Medicine

## 2019-07-06 ENCOUNTER — Other Ambulatory Visit: Payer: Managed Care, Other (non HMO)

## 2019-07-07 ENCOUNTER — Telehealth: Payer: Self-pay

## 2019-07-07 ENCOUNTER — Telehealth (HOSPITAL_COMMUNITY): Payer: Self-pay

## 2019-07-07 NOTE — Telephone Encounter (Signed)
  Follow up Call-  Call back number 07/05/2019 06/24/2019  Post procedure Call Back phone  # [phone] cell [phone]  Permission to leave phone message Yes Yes  Some recent data might be hidden     Patient questions:  Do you have a fever, pain , or abdominal swelling? No. Pain Score  0 *  Have you tolerated food without any problems? Yes.    Have you been able to return to your normal activities? Yes.    Do you have any questions about your discharge instructions: Diet   No. Medications  No. Follow up visit  No.  Do you have questions or concerns about your Care? No.  Actions: * If pain score is 4 or above: No action needed, pain <4.  1. Have you developed a fever since your procedure? no  2.   Have you had an respiratory symptoms (SOB or cough) since your procedure? no  3.   Have you tested positive for COVID 19 since your procedure no  4.   Have you had any family members/close contacts diagnosed with the COVID 19 since your procedure?  no   If yes to any of these questions please route to Joylene John, RN and Alphonsa Gin, Therapist, sports.

## 2019-07-08 ENCOUNTER — Encounter (HOSPITAL_COMMUNITY): Payer: Managed Care, Other (non HMO)

## 2019-07-08 ENCOUNTER — Ambulatory Visit (HOSPITAL_COMMUNITY): Payer: Managed Care, Other (non HMO)

## 2019-07-09 ENCOUNTER — Encounter: Payer: Self-pay | Admitting: *Deleted

## 2019-07-09 NOTE — Progress Notes (Signed)
Oncology Nurse Navigator Documentation  Oncology Nurse Navigator Flowsheets 07/09/2019  Navigator Location [facility]  Navigator Encounter Type Other/I followed up on Jack Lester treatment plan.  It is unclear to me if he came to his appt with Dr. Julien Nordmann so I contacted Dr. Julien Nordmann for clarification.   Telephone -  Treatment Phase Pre-Tx/Tx Discussion  Barriers/Navigation Needs Coordination of Care  Interventions Coordination of Care  Acuity Level 2-Minimal Needs (1-2 Barriers Identified)  Coordination of Care Other  Time Spent with Patient 30

## 2019-07-12 ENCOUNTER — Telehealth (INDEPENDENT_AMBULATORY_CARE_PROVIDER_SITE_OTHER): Payer: Self-pay

## 2019-07-12 ENCOUNTER — Other Ambulatory Visit (INDEPENDENT_AMBULATORY_CARE_PROVIDER_SITE_OTHER): Payer: Self-pay | Admitting: Primary Care

## 2019-07-12 DIAGNOSIS — M542 Cervicalgia: Secondary | ICD-10-CM

## 2019-07-12 MED ORDER — IBUPROFEN 800 MG PO TABS: 600.0000 mg | ORAL_TABLET | Freq: Three times a day (TID) | ORAL | 1 refills | Status: DC

## 2019-07-12 NOTE — Telephone Encounter (Signed)
Patient called in regards to pain medications. States that in his last office visit he advice his PCP about his pain and PCP stated she would send an RX    Please advice [phone]  Patient uses [facility] on [address]

## 2019-07-12 NOTE — Telephone Encounter (Signed)
I spoke with Jack Lester I do not prescribe narcotic patient is followed by oncology advised him to request for pain medication from them. He stated oncology told him his PCP will manage his pain. I told patient I would sent a prescription for ibuprofen 800mg  take as needed patient was in agreemet.

## 2019-07-12 NOTE — Telephone Encounter (Signed)
FWD to PCP. Alacia Rehmann S Cortlyn Cannell, CMA  

## 2019-07-13 ENCOUNTER — Telehealth: Payer: Self-pay | Admitting: *Deleted

## 2019-07-13 DIAGNOSIS — R918 Other nonspecific abnormal finding of lung field: Secondary | ICD-10-CM

## 2019-07-13 NOTE — Telephone Encounter (Signed)
Oncology Nurse Navigator Documentation  Oncology Nurse Navigator Flowsheets 07/13/2019  Abnormal Finding Date 06/30/2019  Navigator Location [facility]  Navigator Encounter Type Telephone/Mr. Ashby was a no show to see Dr. Julien Nordmann.  I called patient to check on him and why he did not show.  Apparently, he was unaware that he needed to see a Med Onc and did not know about his abnormal scan.  I gave him an appt to be seen this week with Dr. Julien Nordmann.  Patient verbalized understanding of appt time and place.   Telephone Outgoing Call  Treatment Phase Abnormal Scans  Barriers/Navigation Needs Coordination of Care;Education  Interventions Coordination of Care;Education  Acuity Level 2-Minimal Needs (1-2 Barriers Identified)  Coordination of Care Appts  Time Spent with Patient 30

## 2019-07-15 ENCOUNTER — Other Ambulatory Visit: Payer: Self-pay

## 2019-07-15 ENCOUNTER — Other Ambulatory Visit: Payer: Self-pay | Admitting: *Deleted

## 2019-07-15 ENCOUNTER — Encounter: Payer: Self-pay | Admitting: *Deleted

## 2019-07-15 ENCOUNTER — Encounter: Payer: Self-pay | Admitting: Internal Medicine

## 2019-07-15 ENCOUNTER — Inpatient Hospital Stay (HOSPITAL_BASED_OUTPATIENT_CLINIC_OR_DEPARTMENT_OTHER): Payer: Managed Care, Other (non HMO) | Admitting: Internal Medicine

## 2019-07-15 ENCOUNTER — Inpatient Hospital Stay: Payer: Managed Care, Other (non HMO) | Attending: Internal Medicine

## 2019-07-15 DIAGNOSIS — J449 Chronic obstructive pulmonary disease, unspecified: Secondary | ICD-10-CM | POA: Insufficient documentation

## 2019-07-15 DIAGNOSIS — Z7951 Long term (current) use of inhaled steroids: Secondary | ICD-10-CM

## 2019-07-15 DIAGNOSIS — Z8249 Family history of ischemic heart disease and other diseases of the circulatory system: Secondary | ICD-10-CM

## 2019-07-15 DIAGNOSIS — F17210 Nicotine dependence, cigarettes, uncomplicated: Secondary | ICD-10-CM | POA: Insufficient documentation

## 2019-07-15 DIAGNOSIS — M545 Low back pain: Secondary | ICD-10-CM

## 2019-07-15 DIAGNOSIS — Z79899 Other long term (current) drug therapy: Secondary | ICD-10-CM | POA: Insufficient documentation

## 2019-07-15 DIAGNOSIS — I10 Essential (primary) hypertension: Secondary | ICD-10-CM

## 2019-07-15 DIAGNOSIS — C20 Malignant neoplasm of rectum: Secondary | ICD-10-CM | POA: Insufficient documentation

## 2019-07-15 DIAGNOSIS — C7951 Secondary malignant neoplasm of bone: Secondary | ICD-10-CM | POA: Insufficient documentation

## 2019-07-15 DIAGNOSIS — C782 Secondary malignant neoplasm of pleura: Secondary | ICD-10-CM | POA: Insufficient documentation

## 2019-07-15 DIAGNOSIS — Z791 Long term (current) use of non-steroidal anti-inflammatories (NSAID): Secondary | ICD-10-CM | POA: Diagnosis not present

## 2019-07-15 DIAGNOSIS — R918 Other nonspecific abnormal finding of lung field: Secondary | ICD-10-CM | POA: Diagnosis not present

## 2019-07-15 DIAGNOSIS — R911 Solitary pulmonary nodule: Secondary | ICD-10-CM

## 2019-07-15 DIAGNOSIS — K219 Gastro-esophageal reflux disease without esophagitis: Secondary | ICD-10-CM

## 2019-07-15 LAB — CBC WITH DIFFERENTIAL (CANCER CENTER ONLY)
Abs Immature Granulocytes: 0.03 10*3/uL (ref 0.00–0.07)
Basophils Absolute: 0.1 10*3/uL (ref 0.0–0.1)
Basophils Relative: 1 %
Eosinophils Absolute: 0.4 10*3/uL (ref 0.0–0.5)
Eosinophils Relative: 4 %
HCT: 35 % — ABNORMAL LOW (ref 39.0–52.0)
Hemoglobin: 11.5 g/dL — ABNORMAL LOW (ref 13.0–17.0)
Immature Granulocytes: 0 %
Lymphocytes Relative: 20 %
Lymphs Abs: 1.7 10*3/uL (ref 0.7–4.0)
MCH: 33.6 pg (ref 26.0–34.0)
MCHC: 32.9 g/dL (ref 30.0–36.0)
MCV: 102.3 fL — ABNORMAL HIGH (ref 80.0–100.0)
Monocytes Absolute: 0.7 10*3/uL (ref 0.1–1.0)
Monocytes Relative: 8 %
Neutro Abs: 5.8 10*3/uL (ref 1.7–7.7)
Neutrophils Relative %: 67 %
Platelet Count: 252 10*3/uL (ref 150–400)
RBC: 3.42 MIL/uL — ABNORMAL LOW (ref 4.22–5.81)
RDW: 13.2 % (ref 11.5–15.5)
WBC Count: 8.6 10*3/uL (ref 4.0–10.5)
nRBC: 0 % (ref 0.0–0.2)

## 2019-07-15 LAB — CMP (CANCER CENTER ONLY)
ALT: 7 U/L (ref 0–44)
AST: 18 U/L (ref 15–41)
Albumin: 3 g/dL — ABNORMAL LOW (ref 3.5–5.0)
Alkaline Phosphatase: 66 U/L (ref 38–126)
Anion gap: 9 (ref 5–15)
BUN: 15 mg/dL (ref 6–20)
CO2: 24 mmol/L (ref 22–32)
Calcium: 8.8 mg/dL — ABNORMAL LOW (ref 8.9–10.3)
Chloride: 107 mmol/L (ref 98–111)
Creatinine: 0.83 mg/dL (ref 0.61–1.24)
GFR, Est AFR Am: >60 mL/min (ref >60)
GFR, Estimated: >60 mL/min (ref >60)
Glucose, Bld: 121 mg/dL — ABNORMAL HIGH (ref 70–99)
Potassium: 3.8 mmol/L (ref 3.5–5.1)
Sodium: 140 mmol/L (ref 135–145)
Total Bilirubin: <0.2 mg/dL — ABNORMAL LOW (ref 0.3–1.2)
Total Protein: 7.9 g/dL (ref 6.5–8.1)
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
## 2019-07-15 NOTE — Patient Instructions (Signed)
Steps to Quit Smoking Smoking tobacco is the leading cause of preventable death. It can affect almost every organ in the body. Smoking puts you and those around you at risk for developing many serious chronic diseases. Quitting smoking can be difficult, but it is one of the best things that you can do for your health. It is never too late to quit. How do I get ready to quit? When you decide to quit smoking, create a plan to help you succeed. Before you quit:  Pick a date to quit. Set a date within the next 2 weeks to give you time to prepare.  Write down the reasons why you are quitting. Keep this list in places where you will see it often.  Tell your family, friends, and co-workers that you are quitting. Support from your loved ones can make quitting easier.  Talk with your health care provider about your options for quitting smoking.  Find out what treatment options are covered by your health insurance.  Identify people, places, things, and activities that make you want to smoke (triggers). Avoid them. What first steps can I take to quit smoking?  Throw away all cigarettes at home, at work, and in your car.  Throw away smoking accessories, such as Scientist, research (medical).  Clean your car. Make sure to empty the ashtray.  Clean your home, including curtains and carpets. What strategies can I use to quit smoking? Talk with your health care provider about combining strategies, such as taking medicines while you are also receiving in-person counseling. Using these two strategies together makes you more likely to succeed in quitting than if you used either strategy on its own.  If you are pregnant or breastfeeding, talk with your health care provider about finding counseling or other support strategies to quit smoking. Do not take medicine to help you quit smoking unless your health care provider tells you to do so. To quit smoking: Quit right away  Quit smoking completely, instead of  gradually reducing how much you smoke over a period of time. Research shows that stopping smoking right away is more successful than gradually quitting.  Attend in-person counseling to help you build problem-solving skills. You are more likely to succeed in quitting if you attend counseling sessions regularly. Even short sessions of 10 minutes can be effective. Take medicine You may take medicines to help you quit smoking. Some medicines require a prescription and some you can purchase over-the-counter. Medicines may have nicotine in them to replace the nicotine in cigarettes. Medicines may:  Help to stop cravings.  Help to relieve withdrawal symptoms. Your health care provider may recommend:  Nicotine patches, gum, or lozenges.  Nicotine inhalers or sprays.  Non-nicotine medicine that is taken by mouth. Find resources Find resources and support systems that can help you to quit smoking and remain smoke-free after you quit. These resources are most helpful when you use them often. They include:  Online chats with a Social worker.  Telephone quitlines.  Printed Furniture conservator/restorer.  Support groups or group counseling.  Text messaging programs.  Mobile phone apps or applications. Use apps that can help you stick to your quit plan by providing reminders, tips, and encouragement. There are many free apps for mobile devices as well as websites. Examples include Quit Guide from the [employer] and smokefree.gov What things can I do to make it easier to quit?   Reach out to your family and friends for support and encouragement. Call telephone quitlines (1-800-QUIT-NOW), reach  out to support groups, or work with a Social worker for support.  Ask people who smoke to avoid smoking around you.  Avoid places that trigger you to smoke, such as bars, parties, or smoke-break areas at work.  Spend time with people who do not smoke.  Lessen the stress in your life. Stress can be a smoking trigger for some  people. To lessen stress, try: ? Exercising regularly. ? Doing deep-breathing exercises. ? Doing yoga. ? Meditating. ? Performing a body scan. This involves closing your eyes, scanning your body from head to toe, and noticing which parts of your body are particularly tense. Try to relax the muscles in those areas. How will I feel when I quit smoking? Day 1 to 3 weeks Within the first 24 hours of quitting smoking, you may start to feel withdrawal symptoms. These symptoms are usually most noticeable 2-3 days after quitting, but they usually do not last for more than 2-3 weeks. You may experience these symptoms:  Mood swings.  Restlessness, anxiety, or irritability.  Trouble concentrating.  Dizziness.  Strong cravings for sugary foods and nicotine.  Mild weight gain.  Constipation.  Nausea.  Coughing or a sore throat.  Changes in how the medicines that you take for unrelated issues work in your body.  Depression.  Trouble sleeping (insomnia). Week 3 and afterward After the first 2-3 weeks of quitting, you may start to notice more positive results, such as:  Improved sense of smell and taste.  Decreased coughing and sore throat.  Slower heart rate.  Lower blood pressure.  Clearer skin.  The ability to breathe more easily.  Fewer sick days. Quitting smoking can be very challenging. Do not get discouraged if you are not successful the first time. Some people need to make many attempts to quit before they achieve long-term success. Do your best to stick to your quit plan, and talk with your health care provider if you have any questions or concerns. Summary  Smoking tobacco is the leading cause of preventable death. Quitting smoking is one of the best things that you can do for your health.  When you decide to quit smoking, create a plan to help you succeed.  Quit smoking right away, not slowly over a period of time.  When you start quitting, seek help from your  health care provider, family, or friends. This information is not intended to replace advice given to you by your health care provider. Make sure you discuss any questions you have with your health care provider. Document Released: 08/13/2001 Document Revised: 11/06/2018 Document Reviewed: 11/07/2018 Elsevier Patient Education  2020 [employer] with Quitting Smoking  Quitting smoking is a physical and mental challenge. You will face cravings, withdrawal symptoms, and temptation. Before quitting, work with your health care provider to make a plan that can help you cope. Preparation can help you quit and keep you from giving in. How can I cope with cravings? Cravings usually last for 5-10 minutes. If you get through it, the craving will pass. Consider taking the following actions to help you cope with cravings:  Keep your mouth busy: ? Chew sugar-free gum. ? Suck on hard candies or a straw. ? Brush your teeth.  Keep your hands and body busy: ? Immediately change to a different activity when you feel a craving. ? Squeeze or play with a ball. ? Do an activity or a hobby, like making bead jewelry, practicing needlepoint, or working with wood. ? Mix  up your normal routine. ? Take a short exercise break. Go for a quick walk or run up and down stairs. ? Spend time in public places where smoking is not allowed.  Focus on doing something kind or helpful for someone else.  Call a friend or family member to talk during a craving.  Join a support group.  Call a quit line, such as 1-800-QUIT-NOW.  Talk with your health care provider about medicines that might help you cope with cravings and make quitting easier for you. How can I deal with withdrawal symptoms? Your body may experience negative effects as it tries to get used to not having nicotine in the system. These effects are called withdrawal symptoms. They may include:  Feeling hungrier than normal.  Trouble  concentrating.  Irritability.  Trouble sleeping.  Feeling depressed.  Restlessness and agitation.  Craving a cigarette. To manage withdrawal symptoms:  Avoid places, people, and activities that trigger your cravings.  Remember why you want to quit.  Get plenty of sleep.  Avoid coffee and other caffeinated drinks. These may worsen some of your symptoms. How can I handle social situations? Social situations can be difficult when you are quitting smoking, especially in the first few weeks. To manage this, you can:  Avoid parties, bars, and other social situations where people might be smoking.  Avoid alcohol.  Leave right away if you have the urge to smoke.  Explain to your family and friends that you are quitting smoking. Ask for understanding and support.  Plan activities with friends or family where smoking is not an option. What are some ways I can cope with stress? Wanting to smoke may cause stress, and stress can make you want to smoke. Find ways to manage your stress. Relaxation techniques can help. For example:  Breathe slowly and deeply, in through your nose and out through your mouth.  Listen to soothing, relaxing music.  Talk with a family member or friend about your stress.  Light a candle.  Soak in a bath or take a shower.  Think about a peaceful place. What are some ways I can prevent weight gain? Be aware that many people gain weight after they quit smoking. However, not everyone does. To keep from gaining weight, have a plan in place before you quit and stick to the plan after you quit. Your plan should include:  Having healthy snacks. When you have a craving, it may help to: ? Eat plain popcorn, crunchy carrots, celery, or other cut vegetables. ? Chew sugar-free gum.  Changing how you eat: ? Eat small portion sizes at meals. ? Eat 4-6 small meals throughout the day instead of 1-2 large meals a day. ? Be mindful when you eat. Do not watch  television or do other things that might distract you as you eat.  Exercising regularly: ? Make time to exercise each day. If you do not have time for a long workout, do short bouts of exercise for 5-10 minutes several times a day. ? Do some form of strengthening exercise, like weight lifting, and some form of aerobic exercise, like running or swimming.  Drinking plenty of water or other low-calorie or no-calorie drinks. Drink 6-8 glasses of water daily, or as much as instructed by your health care provider. Summary  Quitting smoking is a physical and mental challenge. You will face cravings, withdrawal symptoms, and temptation to smoke again. Preparation can help you as you go through these challenges.  You can cope with  cravings by keeping your mouth busy (such as by chewing gum), keeping your body and hands busy, and making calls to family, friends, or a helpline for people who want to quit smoking.  You can cope with withdrawal symptoms by avoiding places where people smoke, avoiding drinks with caffeine, and getting plenty of rest.  Ask your health care provider about the different ways to prevent weight gain, avoid stress, and handle social situations. This information is not intended to replace advice given to you by your health care provider. Make sure you discuss any questions you have with your health care provider. Document Released: 08/16/2016 Document Revised: 08/01/2017 Document Reviewed: 08/16/2016 Elsevier Patient Education  2020 [employer].

## 2019-07-15 NOTE — Progress Notes (Signed)
The proposed treatment discussion in cancer conference 07/15/19 is for discussion purpose only and is not a binding recommendation.  The patient was not physically examined nor present for their treatment options.  Therefore, final treatment plans cannot be decided.

## 2019-07-16 ENCOUNTER — Telehealth: Payer: Self-pay | Admitting: Internal Medicine

## 2019-07-16 ENCOUNTER — Encounter: Payer: Self-pay | Admitting: *Deleted

## 2019-07-16 NOTE — Telephone Encounter (Signed)
Scheduled per los. Called and spoke with patient. Confirmed appt 

## 2019-07-16 NOTE — Progress Notes (Signed)
Oncology Nurse Navigator Documentation  Oncology Nurse Navigator Flowsheets 07/16/2019  Abnormal Finding Date -  Navigator Location [facility]  Navigator Encounter Type Clinic/MDC;Other:  Telephone -  [facility] Date 07/15/2019  Multidisiplinary Clinic Type Thoracic  Patient Visit Type MedOnc  Treatment Phase Abnormal Scans  Barriers/Navigation Needs Coordination of Care;Education  Education Other/I spoke with patient yesterday.  He has abnormal findings on CT scans but no tissue DX.   I help to explain next steps.  Today, I checked his schedule and he is set up to have PET 11/23 with follow up on 11/30 with Dr. Julien Nordmann.   Interventions Coordination of Care;Education  Acuity Level 2-Minimal Needs (1-2 Barriers Identified)  Coordination of Care Other  Education Method Verbal  Time Spent with Patient 45

## 2019-07-20 ENCOUNTER — Other Ambulatory Visit: Payer: Self-pay | Admitting: Surgery

## 2019-07-20 DIAGNOSIS — C20 Malignant neoplasm of rectum: Secondary | ICD-10-CM

## 2019-07-21 ENCOUNTER — Other Ambulatory Visit: Payer: Self-pay | Admitting: Surgery

## 2019-07-21 ENCOUNTER — Other Ambulatory Visit: Payer: Self-pay

## 2019-07-21 ENCOUNTER — Ambulatory Visit
Admission: RE | Admit: 2019-07-21 | Discharge: 2019-07-21 | Disposition: A | Discharge status: Home / Self Care | Payer: Managed Care, Other (non HMO) | Source: Ambulatory Visit | Attending: Surgery | Admitting: Surgery

## 2019-07-21 DIAGNOSIS — C20 Malignant neoplasm of rectum: Secondary | ICD-10-CM

## 2019-07-23 ENCOUNTER — Other Ambulatory Visit: Payer: Managed Care, Other (non HMO)

## 2019-07-26 ENCOUNTER — Ambulatory Visit (HOSPITAL_COMMUNITY)
Admission: RE | Admit: 2019-07-26 | Discharge: 2019-07-26 | Disposition: A | Discharge status: Home / Self Care | Payer: Managed Care, Other (non HMO) | Source: Ambulatory Visit | Attending: Internal Medicine | Admitting: Internal Medicine

## 2019-07-26 ENCOUNTER — Other Ambulatory Visit: Payer: Self-pay

## 2019-07-26 DIAGNOSIS — R911 Solitary pulmonary nodule: Secondary | ICD-10-CM | POA: Diagnosis present

## 2019-07-26 LAB — GLUCOSE, CAPILLARY: Glucose-Capillary: 99 mg/dL (ref 70–99)

## 2019-07-26 MED ORDER — FLUDEOXYGLUCOSE F - 18 (FDG) INJECTION
6.0000 | Freq: Once | INTRAVENOUS | Status: AC | PRN
  Administered 2019-07-26: 6 via INTRAVENOUS

## 2019-08-02 ENCOUNTER — Encounter: Payer: Self-pay | Admitting: Medical Oncology

## 2019-08-02 ENCOUNTER — Other Ambulatory Visit: Payer: Self-pay

## 2019-08-02 ENCOUNTER — Encounter: Payer: Self-pay | Admitting: Internal Medicine

## 2019-08-02 ENCOUNTER — Inpatient Hospital Stay: Payer: Managed Care, Other (non HMO)

## 2019-08-02 ENCOUNTER — Inpatient Hospital Stay (HOSPITAL_BASED_OUTPATIENT_CLINIC_OR_DEPARTMENT_OTHER): Payer: Managed Care, Other (non HMO) | Admitting: Internal Medicine

## 2019-08-02 VITALS — BP 127/90 | HR 68 | Temp 98.0°F | Resp 18 | Ht 70.0 in | Wt 118.5 lb

## 2019-08-02 DIAGNOSIS — C20 Malignant neoplasm of rectum: Secondary | ICD-10-CM

## 2019-08-02 DIAGNOSIS — C782 Secondary malignant neoplasm of pleura: Secondary | ICD-10-CM

## 2019-08-02 DIAGNOSIS — C3490 Malignant neoplasm of unspecified part of unspecified bronchus or lung: Secondary | ICD-10-CM

## 2019-08-02 DIAGNOSIS — G893 Neoplasm related pain (acute) (chronic): Secondary | ICD-10-CM

## 2019-08-02 DIAGNOSIS — C7951 Secondary malignant neoplasm of bone: Secondary | ICD-10-CM

## 2019-08-02 DIAGNOSIS — R911 Solitary pulmonary nodule: Secondary | ICD-10-CM

## 2019-08-02 LAB — CBC WITH DIFFERENTIAL (CANCER CENTER ONLY)
Abs Immature Granulocytes: 0.02 10*3/uL (ref 0.00–0.07)
Basophils Absolute: 0.1 10*3/uL (ref 0.0–0.1)
Basophils Relative: 1 %
Eosinophils Absolute: 0.5 10*3/uL (ref 0.0–0.5)
Eosinophils Relative: 6 %
HCT: 35.8 % — ABNORMAL LOW (ref 39.0–52.0)
Hemoglobin: 11.6 g/dL — ABNORMAL LOW (ref 13.0–17.0)
Immature Granulocytes: 0 %
Lymphocytes Relative: 20 %
Lymphs Abs: 1.8 10*3/uL (ref 0.7–4.0)
MCH: 33.5 pg (ref 26.0–34.0)
MCHC: 32.4 g/dL (ref 30.0–36.0)
MCV: 103.5 fL — ABNORMAL HIGH (ref 80.0–100.0)
Monocytes Absolute: 0.9 10*3/uL (ref 0.1–1.0)
Monocytes Relative: 10 %
Neutro Abs: 5.8 10*3/uL (ref 1.7–7.7)
Neutrophils Relative %: 63 %
Platelet Count: 295 10*3/uL (ref 150–400)
RBC: 3.46 MIL/uL — ABNORMAL LOW (ref 4.22–5.81)
RDW: 14.4 % (ref 11.5–15.5)
WBC Count: 9.1 10*3/uL (ref 4.0–10.5)
nRBC: 0 % (ref 0.0–0.2)

## 2019-08-02 LAB — CMP (CANCER CENTER ONLY)
ALT: 7 U/L (ref 0–44)
AST: 16 U/L (ref 15–41)
Albumin: 3.1 g/dL — ABNORMAL LOW (ref 3.5–5.0)
Alkaline Phosphatase: 61 U/L (ref 38–126)
Anion gap: 11 (ref 5–15)
BUN: 10 mg/dL (ref 6–20)
CO2: 25 mmol/L (ref 22–32)
Calcium: 9.8 mg/dL (ref 8.9–10.3)
Chloride: 105 mmol/L (ref 98–111)
Creatinine: 0.98 mg/dL (ref 0.61–1.24)
GFR, Est AFR Am: >60 mL/min (ref >60)
GFR, Estimated: >60 mL/min (ref >60)
Glucose, Bld: 76 mg/dL (ref 70–99)
Potassium: 4.3 mmol/L (ref 3.5–5.1)
Sodium: 141 mmol/L (ref 135–145)
Total Bilirubin: 0.2 mg/dL — ABNORMAL LOW (ref 0.3–1.2)
Total Protein: 7.9 g/dL (ref 6.5–8.1)

## 2019-08-02 MED ORDER — OXYCODONE-ACETAMINOPHEN 5-325 MG PO TABS: 1.0000 | ORAL_TABLET | Freq: Three times a day (TID) | ORAL | 0 refills | Status: DC | PRN
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
## 2019-08-03 ENCOUNTER — Telehealth: Payer: Self-pay | Admitting: Internal Medicine

## 2019-08-03 NOTE — Telephone Encounter (Signed)
Scheduled per los. Called and spoke with patient. Confirmed appt 

## 2019-08-05 ENCOUNTER — Other Ambulatory Visit: Payer: Self-pay | Admitting: *Deleted

## 2019-08-05 ENCOUNTER — Other Ambulatory Visit: Payer: Self-pay | Admitting: Internal Medicine

## 2019-08-05 DIAGNOSIS — C782 Secondary malignant neoplasm of pleura: Secondary | ICD-10-CM

## 2019-08-05 NOTE — Progress Notes (Signed)
The proposed treatment discussed in cancer conference 08/05/2019 is for discussion purpose only and is not a binding recommendation.  The patient was not physically examined nor present for their treatment options.  Therefore, final treatment plan cannot be decided.

## 2019-08-09 ENCOUNTER — Encounter (HOSPITAL_COMMUNITY): Payer: Self-pay

## 2019-08-09 NOTE — Progress Notes (Signed)
[facility] Male, 58 y.o., Feb 21, 1961 MRN:  [id number] Phone:  [phone] Jerilynn Mages) PCP:  Kerin Perna, NP Coverage:  Cigna/Cigna Managed Next Appt With Radiology ([facility]-MR 1) 08/12/2019 at 5:00 PM  RE: Biopsy Received: 3 days ago Message Contents  Sandi Mariscal, MD  [facility]  Discussed at Tx conference on 12/3   CT guided pleural Bx - right posterior pleura - image 92, series 4.   Please schedule at [facility].   Cathren Harsh   Previous Messages  ----- Message -----  From: Arne Cleveland, MD  Sent: 08/06/2019  8:30 AM EST  To: Sandi Mariscal, MD  Subject: FW: Biopsy                    Look like another cancer conf approval?  Thx  DDH   ----- Message -----  From: Lenore Cordia  Sent: 08/05/2019  4:33 PM EST  To: Ir Procedure Requests  Subject: Biopsy                      Procedure Requested: CT Biopsy    Reason for Procedure: right pleural based nodule    Provider Requesting: Dr Curt Bears  Provider Telephone:[phone]    Other Info: Rad exam in Epic

## 2019-08-12 ENCOUNTER — Ambulatory Visit (HOSPITAL_COMMUNITY)
Admission: RE | Admit: 2019-08-12 | Discharge: 2019-08-12 | Disposition: A | Discharge status: Home / Self Care | Payer: Managed Care, Other (non HMO) | Source: Ambulatory Visit | Attending: Internal Medicine | Admitting: Internal Medicine

## 2019-08-12 ENCOUNTER — Other Ambulatory Visit: Payer: Self-pay

## 2019-08-12 DIAGNOSIS — C3490 Malignant neoplasm of unspecified part of unspecified bronchus or lung: Secondary | ICD-10-CM | POA: Diagnosis not present

## 2019-08-12 MED ORDER — GADOBUTROL 1 MMOL/ML IV SOLN
5.0000 mL | Freq: Once | INTRAVENOUS | Status: AC | PRN
  Administered 2019-08-12: 20:00:00 5 mL via INTRAVENOUS

## 2019-08-13 ENCOUNTER — Other Ambulatory Visit: Payer: Self-pay | Admitting: Radiation Therapy

## 2019-08-13 ENCOUNTER — Telehealth: Payer: Self-pay | Admitting: *Deleted

## 2019-08-13 ENCOUNTER — Other Ambulatory Visit: Payer: Self-pay | Admitting: Internal Medicine

## 2019-08-13 ENCOUNTER — Other Ambulatory Visit: Payer: Self-pay | Admitting: Physician Assistant

## 2019-08-13 ENCOUNTER — Other Ambulatory Visit (HOSPITAL_COMMUNITY)
Admission: RE | Admit: 2019-08-13 | Discharge: 2019-08-13 | Disposition: A | Discharge status: Home / Self Care | Payer: Managed Care, Other (non HMO) | Source: Ambulatory Visit | Attending: Internal Medicine | Admitting: Internal Medicine

## 2019-08-13 DIAGNOSIS — C7931 Secondary malignant neoplasm of brain: Secondary | ICD-10-CM

## 2019-08-13 DIAGNOSIS — Z20828 Contact with and (suspected) exposure to other viral communicable diseases: Secondary | ICD-10-CM | POA: Diagnosis not present

## 2019-08-13 DIAGNOSIS — C782 Secondary malignant neoplasm of pleura: Secondary | ICD-10-CM

## 2019-08-13 DIAGNOSIS — C7949 Secondary malignant neoplasm of other parts of nervous system: Secondary | ICD-10-CM

## 2019-08-13 DIAGNOSIS — Z01812 Encounter for preprocedural laboratory examination: Secondary | ICD-10-CM | POA: Insufficient documentation

## 2019-08-13 LAB — SARS CORONAVIRUS 2 (TAT 6-24 HRS): SARS Coronavirus 2: NEGATIVE

## 2019-08-13 MED ORDER — DEXAMETHASONE 4 MG PO TABS: 4.0000 mg | ORAL_TABLET | Freq: Two times a day (BID) | ORAL | 0 refills | Status: DC

## 2019-08-13 NOTE — Telephone Encounter (Signed)
-----   Message from Curt Bears, MD sent at 08/13/2019 12:03 PM EST ----- There is a small spot in the brain.  We will start him on Decadron 4 mg twice daily until we refer him to radiation oncology.  Please let him know that I will send the prescription to his pharmacy.  Thank you. ----- Message ----- From: Interface, Rad Results In Sent: 08/13/2019   9:59 AM EST To: Curt Bears, MD

## 2019-08-13 NOTE — Telephone Encounter (Signed)
Notified of message below. Verbalized understanding 

## 2019-08-14 ENCOUNTER — Other Ambulatory Visit: Payer: Self-pay | Admitting: Radiology

## 2019-08-14 ENCOUNTER — Other Ambulatory Visit (INDEPENDENT_AMBULATORY_CARE_PROVIDER_SITE_OTHER): Payer: Self-pay | Admitting: Primary Care

## 2019-08-16 ENCOUNTER — Other Ambulatory Visit: Payer: Self-pay

## 2019-08-16 ENCOUNTER — Ambulatory Visit (HOSPITAL_COMMUNITY)
Admission: RE | Admit: 2019-08-16 | Discharge: 2019-08-16 | Disposition: A | Discharge status: Home / Self Care | Payer: Managed Care, Other (non HMO) | Source: Ambulatory Visit | Attending: Internal Medicine | Admitting: Internal Medicine

## 2019-08-16 ENCOUNTER — Ambulatory Visit (HOSPITAL_COMMUNITY)
Admission: RE | Admit: 2019-08-16 | Discharge: 2019-08-16 | Disposition: A | Discharge status: Home / Self Care | Payer: Managed Care, Other (non HMO) | Source: Ambulatory Visit | Attending: Interventional Radiology | Admitting: Interventional Radiology

## 2019-08-16 DIAGNOSIS — K219 Gastro-esophageal reflux disease without esophagitis: Secondary | ICD-10-CM | POA: Insufficient documentation

## 2019-08-16 DIAGNOSIS — Z800 Family history of malignant neoplasm of digestive organs: Secondary | ICD-10-CM | POA: Insufficient documentation

## 2019-08-16 DIAGNOSIS — Z7951 Long term (current) use of inhaled steroids: Secondary | ICD-10-CM | POA: Insufficient documentation

## 2019-08-16 DIAGNOSIS — I10 Essential (primary) hypertension: Secondary | ICD-10-CM | POA: Insufficient documentation

## 2019-08-16 DIAGNOSIS — Z9889 Other specified postprocedural states: Secondary | ICD-10-CM | POA: Insufficient documentation

## 2019-08-16 DIAGNOSIS — Z79899 Other long term (current) drug therapy: Secondary | ICD-10-CM | POA: Diagnosis not present

## 2019-08-16 DIAGNOSIS — C7951 Secondary malignant neoplasm of bone: Secondary | ICD-10-CM | POA: Diagnosis not present

## 2019-08-16 DIAGNOSIS — Z792 Long term (current) use of antibiotics: Secondary | ICD-10-CM | POA: Diagnosis not present

## 2019-08-16 DIAGNOSIS — Z791 Long term (current) use of non-steroidal anti-inflammatories (NSAID): Secondary | ICD-10-CM | POA: Insufficient documentation

## 2019-08-16 DIAGNOSIS — C20 Malignant neoplasm of rectum: Secondary | ICD-10-CM | POA: Insufficient documentation

## 2019-08-16 DIAGNOSIS — Z8249 Family history of ischemic heart disease and other diseases of the circulatory system: Secondary | ICD-10-CM | POA: Diagnosis not present

## 2019-08-16 DIAGNOSIS — J449 Chronic obstructive pulmonary disease, unspecified: Secondary | ICD-10-CM | POA: Insufficient documentation

## 2019-08-16 DIAGNOSIS — F17210 Nicotine dependence, cigarettes, uncomplicated: Secondary | ICD-10-CM | POA: Diagnosis not present

## 2019-08-16 DIAGNOSIS — C782 Secondary malignant neoplasm of pleura: Secondary | ICD-10-CM | POA: Diagnosis not present

## 2019-08-16 LAB — CBC
HCT: 35.9 % — ABNORMAL LOW (ref 39.0–52.0)
Hemoglobin: 11.8 g/dL — ABNORMAL LOW (ref 13.0–17.0)
MCH: 33.7 pg (ref 26.0–34.0)
MCHC: 32.9 g/dL (ref 30.0–36.0)
MCV: 102.6 fL — ABNORMAL HIGH (ref 80.0–100.0)
Platelets: 421 10*3/uL — ABNORMAL HIGH (ref 150–400)
RBC: 3.5 MIL/uL — ABNORMAL LOW (ref 4.22–5.81)
RDW: 14.1 % (ref 11.5–15.5)
WBC: 16.5 10*3/uL — ABNORMAL HIGH (ref 4.0–10.5)
nRBC: 0 % (ref 0.0–0.2)

## 2019-08-16 LAB — PROTIME-INR
INR: 1 (ref 0.8–1.2)
Prothrombin Time: 12.9 seconds (ref 11.4–15.2)

## 2019-08-16 MED ORDER — HYDROCODONE-ACETAMINOPHEN 5-325 MG PO TABS: 1.0000 | ORAL_TABLET | ORAL | Status: DC | PRN

## 2019-08-16 MED ORDER — LIDOCAINE-EPINEPHRINE 1 %-1:100000 IJ SOLN
INTRAMUSCULAR | Status: AC
  Filled 2019-08-16: qty 1

## 2019-08-16 MED ORDER — SODIUM CHLORIDE 0.9 % IV SOLN
INTRAVENOUS | Status: AC | PRN
  Administered 2019-08-16: 10 mL/h via INTRAVENOUS

## 2019-08-16 MED ORDER — MIDAZOLAM HCL 2 MG/2ML IJ SOLN
INTRAMUSCULAR | Status: AC | PRN
  Administered 2019-08-16: 1 mg via INTRAVENOUS

## 2019-08-16 MED ORDER — MIDAZOLAM HCL 2 MG/2ML IJ SOLN
INTRAMUSCULAR | Status: AC
  Filled 2019-08-16: qty 4

## 2019-08-16 MED ORDER — FENTANYL CITRATE (PF) 100 MCG/2ML IJ SOLN
INTRAMUSCULAR | Status: AC | PRN
  Administered 2019-08-16 (×2): 50 ug via INTRAVENOUS

## 2019-08-16 MED ORDER — FENTANYL CITRATE (PF) 100 MCG/2ML IJ SOLN
INTRAMUSCULAR | Status: AC
  Filled 2019-08-16: qty 4

## 2019-08-16 MED ORDER — SODIUM CHLORIDE 0.9 % IV SOLN: INTRAVENOUS | Status: DC

## 2019-08-16 MED ORDER — MIDAZOLAM HCL 5 MG/5ML IJ SOLN
INTRAMUSCULAR | Status: AC | PRN
  Administered 2019-08-16: 1 mg via INTRAVENOUS

## 2019-08-16 NOTE — Progress Notes (Addendum)
Discharge instructions reviewed with [name] (via telephone) voices understanding. Reviewed with pt voices understanding.

## 2019-08-16 NOTE — Discharge Instructions (Signed)
Lung Biopsy, Care After This sheet gives you information about how to care for yourself after your procedure. Your health care provider may also give you more specific instructions depending on the type of biopsy you had. If you have problems or questions, contact your health care provider. What can I expect after the procedure? After the procedure, it is common to have:  A cough.  A sore throat.  Pain where a needle, bronchoscope, or incision was used to collect a biopsy sample (biopsy site). Follow these instructions at home: Medicines  Take over-the-counter and prescription medicines only as told by your health care provider.  Do not drink alcohol if your health care provider tells you not to drink.  Ask your health care provider if the medicine prescribed to you: ? Requires you to avoid driving or using heavy machinery. ? Can cause constipation. You may need to take these actions to prevent or treat constipation:  Drink enough fluid to keep your urine pale yellow.  Take over-the-counter or prescription medicines.  Eat foods that are high in fiber, such as beans, whole grains, and fresh fruits and vegetables.  Limit foods that are high in fat and processed sugars, such as fried or sweet foods.  Do not drive for 24 hours if you were given a sedative. Biopsy site care   Follow instructions from your health care provider about how to take care of your biopsy site. Make sure you: ? Wash your hands with soap and water before and after you change your bandage (dressing). If soap and water are not available, use hand sanitizer. ? Change your dressing as told by your health care provider. ? Leave stitches (sutures), skin glue, or adhesive strips in place. These skin closures may need to stay in place for 2 weeks or longer. If adhesive strip edges start to loosen and curl up, you may trim the loose edges. Do not remove adhesive strips completely unless your health care provider tells  you to do that.  Do not take baths, swim, or use a hot tub until your health care provider approves. Ask your health care provider if you may take showers. You may only be allowed to take sponge baths.  Check your biopsy site every day for signs of infection. Check for: ? Redness, swelling, or more pain. ? Fluid or blood. ? Warmth. ? Pus or a bad smell. General instructions  Return to your normal activities as told by your health care provider. Ask your health care provider what activities are safe for you.  It is up to you to get the results of your procedure. Ask your health care provider, or the department that is doing the procedure, when your results will be ready.  Keep all follow-up visits as told by your health care provider. This is important. Contact a health care provider if:  You have a fever.  You have redness, swelling, or more pain around your biopsy site.  You have fluid or blood coming from your biopsy site.  Your biopsy site feels warm to the touch.  You have pus or a bad smell coming from your biopsy site.  You have pain that does not get better with medicine. Get help right away if:  You cough up blood.  You have trouble breathing.  You have chest pain.  You lose consciousness. Summary  After the procedure, it is common to have a sore throat and a cough.  Return to your normal activities as told by your  health care provider. Ask your health care provider what activities are safe for you.  Take over-the-counter and prescription medicines only as told by your health care provider.  Report any unusual symptoms to your health care provider. This information is not intended to replace advice given to you by your health care provider. Make sure you discuss any questions you have with your health care provider. Document Released: 09/17/2016 Document Revised: 09/23/2018 Document Reviewed: 09/17/2016 Elsevier Patient Education  [place].  Moderate Conscious Sedation, Adult, Care After These instructions provide you with information about caring for yourself after your procedure. Your health care provider may also give you more specific instructions. Your treatment has been planned according to current medical practices, but problems sometimes occur. Call your health care provider if you have any problems or questions after your procedure. What can I expect after the procedure? After your procedure, it is common:  To feel sleepy for several hours.  To feel clumsy and have poor balance for several hours.  To have poor judgment for several hours.  To vomit if you eat too soon. Follow these instructions at home: For at least 24 hours after the procedure:   Do not: ? Participate in activities where you could fall or become injured. ? Drive. ? Use heavy machinery. ? Drink alcohol. ? Take sleeping pills or medicines that cause drowsiness. ? Make important decisions or sign legal documents. ? Take care of children on your own.  Rest. Eating and drinking  Follow the diet recommended by your health care provider.  If you vomit: ? Drink water, juice, or soup when you can drink without vomiting. ? Make sure you have little or no nausea before eating solid foods. General instructions  Have a responsible adult stay with you until you are awake and alert.  Take over-the-counter and prescription medicines only as told by your health care provider.  If you smoke, do not smoke without supervision.  Keep all follow-up visits as told by your health care provider. This is important. Contact a health care provider if:  You keep feeling nauseous or you keep vomiting.  You feel light-headed.  You develop a rash.  You have a fever. Get help right away if:  You have trouble breathing. This information is not intended to replace advice given to you by your health care provider. Make sure you discuss any questions  you have with your health care provider. Document Released: 06/09/2013 Document Revised: 08/01/2017 Document Reviewed: 12/09/2015 Elsevier Patient Education  2020 [employer].

## 2019-08-16 NOTE — Progress Notes (Signed)
Ambulated to bathroom to void. Ambulated in hallway tol well

## 2019-08-16 NOTE — Procedures (Signed)
Pre procedural Dx: Hypermetabolic right pleural thickening  Post procedural Dx: Same  Technically successful CT guided biopsy of hypermetabolic right sided pleural thickening.    EBL: None.  Complications: None immediate.   Ronny Bacon, MD Pager #: [phone]
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
## 2019-08-17 ENCOUNTER — Inpatient Hospital Stay: Payer: Managed Care, Other (non HMO) | Attending: Internal Medicine | Admitting: Physician Assistant

## 2019-08-17 ENCOUNTER — Encounter: Payer: Self-pay | Admitting: Physician Assistant

## 2019-08-17 ENCOUNTER — Telehealth: Payer: Self-pay | Admitting: Internal Medicine

## 2019-08-17 ENCOUNTER — Other Ambulatory Visit: Payer: Self-pay

## 2019-08-17 VITALS — BP 154/85 | HR 63 | Temp 97.8°F | Resp 20 | Wt 115.6 lb

## 2019-08-17 DIAGNOSIS — R918 Other nonspecific abnormal finding of lung field: Secondary | ICD-10-CM | POA: Diagnosis not present

## 2019-08-17 DIAGNOSIS — C20 Malignant neoplasm of rectum: Secondary | ICD-10-CM | POA: Insufficient documentation

## 2019-08-17 DIAGNOSIS — Z7951 Long term (current) use of inhaled steroids: Secondary | ICD-10-CM | POA: Diagnosis not present

## 2019-08-17 DIAGNOSIS — Z79899 Other long term (current) drug therapy: Secondary | ICD-10-CM | POA: Insufficient documentation

## 2019-08-17 DIAGNOSIS — C7931 Secondary malignant neoplasm of brain: Secondary | ICD-10-CM | POA: Diagnosis not present

## 2019-08-17 DIAGNOSIS — Z791 Long term (current) use of non-steroidal anti-inflammatories (NSAID): Secondary | ICD-10-CM | POA: Insufficient documentation

## 2019-08-17 DIAGNOSIS — J449 Chronic obstructive pulmonary disease, unspecified: Secondary | ICD-10-CM | POA: Insufficient documentation

## 2019-08-17 DIAGNOSIS — G893 Neoplasm related pain (acute) (chronic): Secondary | ICD-10-CM | POA: Insufficient documentation

## 2019-08-17 DIAGNOSIS — C782 Secondary malignant neoplasm of pleura: Secondary | ICD-10-CM | POA: Diagnosis not present

## 2019-08-17 DIAGNOSIS — C7951 Secondary malignant neoplasm of bone: Secondary | ICD-10-CM | POA: Diagnosis not present

## 2019-08-17 DIAGNOSIS — I10 Essential (primary) hypertension: Secondary | ICD-10-CM | POA: Insufficient documentation

## 2019-08-17 MED ORDER — OXYCODONE-ACETAMINOPHEN 5-325 MG PO TABS: 1.0000 | ORAL_TABLET | Freq: Three times a day (TID) | ORAL | 0 refills | Status: DC | PRN

## 2019-08-17 NOTE — Telephone Encounter (Signed)
Gave patient avs report and appointments for December  °
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
## 2019-08-18 LAB — SURGICAL PATHOLOGY

## 2019-08-19 ENCOUNTER — Telehealth: Payer: Self-pay | Admitting: Radiation Oncology

## 2019-08-19 ENCOUNTER — Telehealth: Payer: Self-pay | Admitting: *Deleted

## 2019-08-19 ENCOUNTER — Encounter: Payer: Self-pay | Admitting: Radiation Oncology

## 2019-08-19 NOTE — Telephone Encounter (Signed)
[facility]  Clinical Social Work was referred by medical oncology for assessment of psychosocial needs.  Clinical Social Worker attempted to contact patient to offer support and assess for needs.  CSW left voicemail requesting patient return call.   Gwinda Maine, LCSW  Clinical Social Worker [facility]

## 2019-08-19 NOTE — Progress Notes (Signed)
Location/Histology of Brain Tumor: rectal adenocarcinoma as well as extensive disease on the right side of the lung with pleural based hypermetabolic nodules as well as hypermetabolic mediastinal lymphadenopathy and pulmonary nodules as well as metastatic bone disease concerning for a second primary in the lung. Also, a 7 mm centrally necrotic metastasis in the left frontal lobe with vasogenic edema. No significant mass-effect, shift or hemorrhage. No second lesion.  Patient presented with symptoms of:  Left frontal met found on staging workup  Past or anticipated interventions, if any, per neurosurgery: no, extensive disease  Past or anticipated interventions, if any, per medical oncology: yes, consulted, referred to Dr. Tammi Klippel for management of brain lesion, sent lung biopsy specimen for immunohistochemical staining  Dose of Decadron, if applicable: yes, decadron 4 mg bid to manage vasogenic edema. Denies evidence of thrush. This RN concerned that patient has script for Prilosec but isn't taking it. Requesting providers reinforce need for this medication.  Recent neurologic symptoms, if any:   Seizures: denies  Headaches: reports occasional occipital headaches  Nausea: denies  Dizziness/ataxia: yes, constant  Difficulty with hand coordination: denies  Focal numbness/weakness: yes, numbness from fingertips to ankle on right side  Visual deficits/changes: yes, occasional diplopia and sharp light  Confusion/Memory deficits: Very poor short term memory. Denies confusion  Painful bone metastases at present, if any: yes, L4. Reports low back pain is worse now than his rectal pain. Reports taking one percocet tablet every eight hours. Reports the percocet helps take the edge off the pain but he is never pain free. Reports full ROM of lower extremities. Reports control of bowel and bladder.   SAFETY ISSUES:  Prior radiation? denies  Pacemaker/ICD? denies  Possible current pregnancy?  no, male patient  Is the patient on methotrexate? no  Additional Complaints / other details: 58 year old male. Has a girlfriend and two children. Expresses anxiety about insurance. Explains he is unable to work more than 15-16 hours per week due to multiple appointments. States, "I am about to loss my insurance because all these appointments keep me from working." Social work consult entered.  Hematochezia present since diagnosis. Mild cough. Denies hemoptysis. Denies shortness of breath. Reports weight loss.   Rectal mass evaluated by general surgery but on hold for further work up.   3T SRS MRI scheduled for 08/25/2019

## 2019-08-20 ENCOUNTER — Other Ambulatory Visit: Payer: Self-pay

## 2019-08-20 ENCOUNTER — Other Ambulatory Visit: Payer: Self-pay | Admitting: Urology

## 2019-08-20 ENCOUNTER — Ambulatory Visit
Admission: RE | Admit: 2019-08-20 | Discharge: 2019-08-20 | Disposition: A | Discharge status: Home / Self Care | Payer: Managed Care, Other (non HMO) | Source: Ambulatory Visit | Attending: Radiation Oncology | Admitting: Radiation Oncology

## 2019-08-20 ENCOUNTER — Telehealth: Payer: Self-pay | Admitting: Physician Assistant

## 2019-08-20 ENCOUNTER — Other Ambulatory Visit: Payer: Self-pay | Admitting: Physician Assistant

## 2019-08-20 ENCOUNTER — Institutional Professional Consult (permissible substitution): Payer: Managed Care, Other (non HMO) | Admitting: Radiation Oncology

## 2019-08-20 ENCOUNTER — Encounter: Payer: Self-pay | Admitting: Radiation Oncology

## 2019-08-20 VITALS — Ht 69.0 in | Wt 115.0 lb

## 2019-08-20 DIAGNOSIS — C20 Malignant neoplasm of rectum: Secondary | ICD-10-CM

## 2019-08-20 DIAGNOSIS — C7951 Secondary malignant neoplasm of bone: Secondary | ICD-10-CM

## 2019-08-20 DIAGNOSIS — C3492 Malignant neoplasm of unspecified part of left bronchus or lung: Secondary | ICD-10-CM | POA: Insufficient documentation

## 2019-08-20 DIAGNOSIS — C7931 Secondary malignant neoplasm of brain: Secondary | ICD-10-CM

## 2019-08-20 HISTORY — DX: Malignant neoplasm of rectum: C20

## 2019-08-20 HISTORY — DX: Malignant (primary) neoplasm, unspecified: C80.1

## 2019-08-20 MED ORDER — DEXAMETHASONE 4 MG PO TABS: 4.0000 mg | ORAL_TABLET | Freq: Two times a day (BID) | ORAL | 0 refills | Status: DC

## 2019-08-20 NOTE — Progress Notes (Signed)
See progress note under physician encounter. 
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
## 2019-08-20 NOTE — Telephone Encounter (Signed)
Called the patient as a follow up from our visit on 08/17/2019. The final pathology was adenocarcinoma that appeared to be lung primary. We will see the patient back for a follow up on 08/25/2019 for a more detailed discussion about his condition and treatment options. I also spoke to pathology at [facility] and requested PDL1 and foundation 1 testing. The patient knows to keep his appointment on 12/23.

## 2019-08-21 ENCOUNTER — Encounter (HOSPITAL_COMMUNITY): Payer: Self-pay | Admitting: Emergency Medicine

## 2019-08-21 ENCOUNTER — Inpatient Hospital Stay (HOSPITAL_COMMUNITY)
Admission: EM | Admit: 2019-08-21 | Discharge: 2019-08-24 | DRG: 189 | Disposition: A | Discharge status: Home / Self Care | Payer: Managed Care, Other (non HMO) | Attending: Internal Medicine | Admitting: Internal Medicine

## 2019-08-21 ENCOUNTER — Other Ambulatory Visit: Payer: Self-pay

## 2019-08-21 ENCOUNTER — Emergency Department (HOSPITAL_COMMUNITY): Payer: Managed Care, Other (non HMO)

## 2019-08-21 DIAGNOSIS — Z8249 Family history of ischemic heart disease and other diseases of the circulatory system: Secondary | ICD-10-CM

## 2019-08-21 DIAGNOSIS — E872 Acidosis: Secondary | ICD-10-CM | POA: Diagnosis present

## 2019-08-21 DIAGNOSIS — Z87891 Personal history of nicotine dependence: Secondary | ICD-10-CM

## 2019-08-21 DIAGNOSIS — R0602 Shortness of breath: Secondary | ICD-10-CM | POA: Diagnosis not present

## 2019-08-21 DIAGNOSIS — Z20828 Contact with and (suspected) exposure to other viral communicable diseases: Secondary | ICD-10-CM | POA: Diagnosis present

## 2019-08-21 DIAGNOSIS — Z800 Family history of malignant neoplasm of digestive organs: Secondary | ICD-10-CM

## 2019-08-21 DIAGNOSIS — E279 Disorder of adrenal gland, unspecified: Secondary | ICD-10-CM | POA: Diagnosis present

## 2019-08-21 DIAGNOSIS — R0902 Hypoxemia: Secondary | ICD-10-CM

## 2019-08-21 DIAGNOSIS — C782 Secondary malignant neoplasm of pleura: Secondary | ICD-10-CM | POA: Diagnosis present

## 2019-08-21 DIAGNOSIS — C20 Malignant neoplasm of rectum: Secondary | ICD-10-CM

## 2019-08-21 DIAGNOSIS — C7951 Secondary malignant neoplasm of bone: Secondary | ICD-10-CM | POA: Diagnosis present

## 2019-08-21 DIAGNOSIS — Z87828 Personal history of other (healed) physical injury and trauma: Secondary | ICD-10-CM

## 2019-08-21 DIAGNOSIS — R636 Underweight: Secondary | ICD-10-CM | POA: Diagnosis present

## 2019-08-21 DIAGNOSIS — I119 Hypertensive heart disease without heart failure: Secondary | ICD-10-CM | POA: Diagnosis present

## 2019-08-21 DIAGNOSIS — N2889 Other specified disorders of kidney and ureter: Secondary | ICD-10-CM | POA: Diagnosis present

## 2019-08-21 DIAGNOSIS — J9601 Acute respiratory failure with hypoxia: Secondary | ICD-10-CM | POA: Diagnosis not present

## 2019-08-21 DIAGNOSIS — I10 Essential (primary) hypertension: Secondary | ICD-10-CM

## 2019-08-21 DIAGNOSIS — C7931 Secondary malignant neoplasm of brain: Secondary | ICD-10-CM

## 2019-08-21 DIAGNOSIS — Z681 Body mass index (BMI) 19 or less, adult: Secondary | ICD-10-CM

## 2019-08-21 DIAGNOSIS — Z9981 Dependence on supplemental oxygen: Secondary | ICD-10-CM

## 2019-08-21 DIAGNOSIS — J449 Chronic obstructive pulmonary disease, unspecified: Secondary | ICD-10-CM

## 2019-08-21 DIAGNOSIS — C3492 Malignant neoplasm of unspecified part of left bronchus or lung: Secondary | ICD-10-CM

## 2019-08-21 DIAGNOSIS — Z79899 Other long term (current) drug therapy: Secondary | ICD-10-CM

## 2019-08-21 DIAGNOSIS — Z7951 Long term (current) use of inhaled steroids: Secondary | ICD-10-CM

## 2019-08-21 DIAGNOSIS — Z79891 Long term (current) use of opiate analgesic: Secondary | ICD-10-CM

## 2019-08-21 DIAGNOSIS — K219 Gastro-esophageal reflux disease without esophagitis: Secondary | ICD-10-CM | POA: Diagnosis present

## 2019-08-21 DIAGNOSIS — G893 Neoplasm related pain (acute) (chronic): Secondary | ICD-10-CM

## 2019-08-21 DIAGNOSIS — C3491 Malignant neoplasm of unspecified part of right bronchus or lung: Secondary | ICD-10-CM | POA: Diagnosis present

## 2019-08-21 LAB — CBC WITH DIFFERENTIAL/PLATELET
Abs Immature Granulocytes: 0.18 10*3/uL — ABNORMAL HIGH (ref 0.00–0.07)
Basophils Absolute: 0 10*3/uL (ref 0.0–0.1)
Basophils Relative: 0 %
Eosinophils Absolute: 0.9 10*3/uL — ABNORMAL HIGH (ref 0.0–0.5)
Eosinophils Relative: 4 %
HCT: 38.4 % — ABNORMAL LOW (ref 39.0–52.0)
Hemoglobin: 12.7 g/dL — ABNORMAL LOW (ref 13.0–17.0)
Immature Granulocytes: 1 %
Lymphocytes Relative: 11 %
Lymphs Abs: 2.3 10*3/uL (ref 0.7–4.0)
MCH: 34 pg (ref 26.0–34.0)
MCHC: 33.1 g/dL (ref 30.0–36.0)
MCV: 102.9 fL — ABNORMAL HIGH (ref 80.0–100.0)
Monocytes Absolute: 2.4 10*3/uL — ABNORMAL HIGH (ref 0.1–1.0)
Monocytes Relative: 11 %
Neutro Abs: 15.8 10*3/uL — ABNORMAL HIGH (ref 1.7–7.7)
Neutrophils Relative %: 73 %
Platelets: 457 10*3/uL — ABNORMAL HIGH (ref 150–400)
RBC: 3.73 MIL/uL — ABNORMAL LOW (ref 4.22–5.81)
RDW: 14.3 % (ref 11.5–15.5)
WBC: 21.6 10*3/uL — ABNORMAL HIGH (ref 4.0–10.5)
nRBC: 0 % (ref 0.0–0.2)

## 2019-08-21 LAB — COMPREHENSIVE METABOLIC PANEL
ALT: 19 U/L (ref 0–44)
AST: 23 U/L (ref 15–41)
Albumin: 3.2 g/dL — ABNORMAL LOW (ref 3.5–5.0)
Alkaline Phosphatase: 87 U/L (ref 38–126)
Anion gap: 12 (ref 5–15)
BUN: 22 mg/dL — ABNORMAL HIGH (ref 6–20)
CO2: 27 mmol/L (ref 22–32)
Calcium: 9.2 mg/dL (ref 8.9–10.3)
Chloride: 101 mmol/L (ref 98–111)
Creatinine, Ser: 0.75 mg/dL (ref 0.61–1.24)
GFR calc Af Amer: >60 mL/min (ref >60)
GFR calc non Af Amer: >60 mL/min (ref >60)
Glucose, Bld: 106 mg/dL — ABNORMAL HIGH (ref 70–99)
Potassium: 4.1 mmol/L (ref 3.5–5.1)
Sodium: 140 mmol/L (ref 135–145)
Total Bilirubin: 0.4 mg/dL (ref 0.3–1.2)
Total Protein: 7.5 g/dL (ref 6.5–8.1)

## 2019-08-21 LAB — TROPONIN I (HIGH SENSITIVITY)
Troponin I (High Sensitivity): 20 ng/L — ABNORMAL HIGH (ref <18)
Troponin I (High Sensitivity): 21 ng/L — ABNORMAL HIGH (ref <18)

## 2019-08-21 LAB — BRAIN NATRIURETIC PEPTIDE: B Natriuretic Peptide: 49 pg/mL (ref 0.0–100.0)

## 2019-08-21 MED ORDER — IOHEXOL 350 MG/ML SOLN
75.0000 mL | Freq: Once | INTRAVENOUS | Status: AC | PRN
  Administered 2019-08-21: 75 mL via INTRAVENOUS
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
## 2019-08-21 NOTE — ED Provider Notes (Signed)
lunc CA w/mets to brain and bone Today with acute SOB  Pending CTA chest,  CXR looks improved from previous (CA)  Plan: re-evaluate after CTA, check room air O2 Ambulate with pulse ox  Re-assess for d/ch vs admit  12:40 - CTA negative for PE. O2 taken from NRB 15 L to 4L [place]. He remains at 100% saturation. Taken to 3 L and patient reports significant SOB, still at 100%. Albuterol/atrovent ordered. Will reassess. He is eating and drinking.    1:40 - Patient is ambulated and desats to 90% with significant dyspnea. Will admit. Will consult oncology to determine whether admission is to [facility] or [facility]. Patient updated.   2:50 - Discussed with Dr. Jana Hakim, oncology. Agrees with need for admission and would prefer transfer to [facility]. Patient updated on plan and is agreeable. Hospitalist paged for admission.    Charlann Lange, PA-C 08/22/19 0302    Lacretia Leigh, MD 08/22/19 [phone]

## 2019-08-21 NOTE — ED Triage Notes (Signed)
The pt arrived by [facility] from home  He is being treated for lung cancer at [facility] he startes chemo next  He has been diagnosed with cancer  2 months ago difficulty breathing he came in on a non-rebreather very sob  He has other areas of cancer poss mets the pt does not know

## 2019-08-22 ENCOUNTER — Other Ambulatory Visit: Payer: Self-pay

## 2019-08-22 DIAGNOSIS — C7931 Secondary malignant neoplasm of brain: Secondary | ICD-10-CM

## 2019-08-22 DIAGNOSIS — C3491 Malignant neoplasm of unspecified part of right bronchus or lung: Secondary | ICD-10-CM | POA: Diagnosis present

## 2019-08-22 DIAGNOSIS — Z9981 Dependence on supplemental oxygen: Secondary | ICD-10-CM | POA: Diagnosis not present

## 2019-08-22 DIAGNOSIS — Z681 Body mass index (BMI) 19 or less, adult: Secondary | ICD-10-CM | POA: Diagnosis not present

## 2019-08-22 DIAGNOSIS — Z8249 Family history of ischemic heart disease and other diseases of the circulatory system: Secondary | ICD-10-CM | POA: Diagnosis not present

## 2019-08-22 DIAGNOSIS — J449 Chronic obstructive pulmonary disease, unspecified: Secondary | ICD-10-CM

## 2019-08-22 DIAGNOSIS — C782 Secondary malignant neoplasm of pleura: Secondary | ICD-10-CM | POA: Diagnosis present

## 2019-08-22 DIAGNOSIS — J9601 Acute respiratory failure with hypoxia: Principal | ICD-10-CM

## 2019-08-22 DIAGNOSIS — I119 Hypertensive heart disease without heart failure: Secondary | ICD-10-CM | POA: Diagnosis present

## 2019-08-22 DIAGNOSIS — C20 Malignant neoplasm of rectum: Secondary | ICD-10-CM

## 2019-08-22 DIAGNOSIS — E872 Acidosis: Secondary | ICD-10-CM | POA: Diagnosis present

## 2019-08-22 DIAGNOSIS — Z800 Family history of malignant neoplasm of digestive organs: Secondary | ICD-10-CM | POA: Diagnosis not present

## 2019-08-22 DIAGNOSIS — R0602 Shortness of breath: Secondary | ICD-10-CM | POA: Diagnosis present

## 2019-08-22 DIAGNOSIS — Z87891 Personal history of nicotine dependence: Secondary | ICD-10-CM | POA: Diagnosis not present

## 2019-08-22 DIAGNOSIS — Z87828 Personal history of other (healed) physical injury and trauma: Secondary | ICD-10-CM | POA: Diagnosis not present

## 2019-08-22 DIAGNOSIS — G893 Neoplasm related pain (acute) (chronic): Secondary | ICD-10-CM | POA: Diagnosis present

## 2019-08-22 DIAGNOSIS — C3490 Malignant neoplasm of unspecified part of unspecified bronchus or lung: Secondary | ICD-10-CM

## 2019-08-22 DIAGNOSIS — K219 Gastro-esophageal reflux disease without esophagitis: Secondary | ICD-10-CM | POA: Diagnosis present

## 2019-08-22 DIAGNOSIS — E279 Disorder of adrenal gland, unspecified: Secondary | ICD-10-CM | POA: Diagnosis present

## 2019-08-22 DIAGNOSIS — F17210 Nicotine dependence, cigarettes, uncomplicated: Secondary | ICD-10-CM

## 2019-08-22 DIAGNOSIS — I10 Essential (primary) hypertension: Secondary | ICD-10-CM

## 2019-08-22 DIAGNOSIS — N2889 Other specified disorders of kidney and ureter: Secondary | ICD-10-CM | POA: Diagnosis present

## 2019-08-22 DIAGNOSIS — C7951 Secondary malignant neoplasm of bone: Secondary | ICD-10-CM | POA: Diagnosis present

## 2019-08-22 DIAGNOSIS — R636 Underweight: Secondary | ICD-10-CM | POA: Diagnosis present

## 2019-08-22 DIAGNOSIS — Z7951 Long term (current) use of inhaled steroids: Secondary | ICD-10-CM | POA: Diagnosis not present

## 2019-08-22 DIAGNOSIS — C3492 Malignant neoplasm of unspecified part of left bronchus or lung: Secondary | ICD-10-CM | POA: Diagnosis present

## 2019-08-22 DIAGNOSIS — Z79891 Long term (current) use of opiate analgesic: Secondary | ICD-10-CM | POA: Diagnosis not present

## 2019-08-22 DIAGNOSIS — Z20828 Contact with and (suspected) exposure to other viral communicable diseases: Secondary | ICD-10-CM | POA: Diagnosis present

## 2019-08-22 LAB — LACTIC ACID, PLASMA
Lactic Acid, Venous: 3 mmol/L (ref 0.5–1.9)
Lactic Acid, Venous: 2.9 mmol/L (ref 0.5–1.9)
Lactic Acid, Venous: 2.4 mmol/L (ref 0.5–1.9)

## 2019-08-22 LAB — SARS CORONAVIRUS 2 (TAT 6-24 HRS): SARS Coronavirus 2: NEGATIVE

## 2019-08-22 MED ORDER — FLUCONAZOLE 100 MG PO TABS
100.0000 mg | ORAL_TABLET | Freq: Every day | ORAL | Status: DC
  Administered 2019-08-22 – 2019-08-24 (×3): 100 mg via ORAL
  Filled 2019-08-22 (×3): qty 1

## 2019-08-22 MED ORDER — IPRATROPIUM-ALBUTEROL 0.5-2.5 (3) MG/3ML IN SOLN: 3.0000 mL | Freq: Two times a day (BID) | RESPIRATORY_TRACT | Status: DC

## 2019-08-22 MED ORDER — AMLODIPINE BESYLATE 10 MG PO TABS
10.0000 mg | ORAL_TABLET | Freq: Every day | ORAL | Status: DC
  Administered 2019-08-22 – 2019-08-24 (×3): 10 mg via ORAL
  Filled 2019-08-22 (×3): qty 1

## 2019-08-22 MED ORDER — DOCUSATE SODIUM 100 MG PO CAPS
100.0000 mg | ORAL_CAPSULE | Freq: Every day | ORAL | Status: DC
  Administered 2019-08-22 – 2019-08-24 (×3): 100 mg via ORAL
  Filled 2019-08-22 (×3): qty 1

## 2019-08-22 MED ORDER — IPRATROPIUM-ALBUTEROL 0.5-2.5 (3) MG/3ML IN SOLN
3.0000 mL | Freq: Four times a day (QID) | RESPIRATORY_TRACT | Status: DC | PRN
  Administered 2019-08-22: 3 mL via RESPIRATORY_TRACT
  Filled 2019-08-22: qty 3

## 2019-08-22 MED ORDER — ACETAMINOPHEN 325 MG PO TABS: 650.0000 mg | ORAL_TABLET | Freq: Four times a day (QID) | ORAL | Status: DC | PRN

## 2019-08-22 MED ORDER — OXYCODONE-ACETAMINOPHEN 5-325 MG PO TABS
1.0000 | ORAL_TABLET | Freq: Three times a day (TID) | ORAL | Status: DC | PRN
  Administered 2019-08-22: 1 via ORAL
  Filled 2019-08-22: qty 1

## 2019-08-22 MED ORDER — ALBUTEROL SULFATE HFA 108 (90 BASE) MCG/ACT IN AERS
8.0000 | INHALATION_SPRAY | RESPIRATORY_TRACT | Status: DC | PRN
  Administered 2019-08-22: 8 via RESPIRATORY_TRACT
  Filled 2019-08-22: qty 6.7

## 2019-08-22 MED ORDER — IPRATROPIUM-ALBUTEROL 0.5-2.5 (3) MG/3ML IN SOLN
3.0000 mL | Freq: Four times a day (QID) | RESPIRATORY_TRACT | Status: DC
  Administered 2019-08-22 – 2019-08-24 (×9): 3 mL via RESPIRATORY_TRACT
  Filled 2019-08-22 (×9): qty 3

## 2019-08-22 MED ORDER — IPRATROPIUM BROMIDE HFA 17 MCG/ACT IN AERS
2.0000 | INHALATION_SPRAY | Freq: Once | RESPIRATORY_TRACT | Status: AC
  Administered 2019-08-22: 2 via RESPIRATORY_TRACT
  Filled 2019-08-22: qty 12.9

## 2019-08-22 MED ORDER — ACETAMINOPHEN 650 MG RE SUPP: 650.0000 mg | Freq: Four times a day (QID) | RECTAL | Status: DC | PRN

## 2019-08-22 MED ORDER — OXYCODONE-ACETAMINOPHEN 5-325 MG PO TABS
1.0000 | ORAL_TABLET | Freq: Four times a day (QID) | ORAL | Status: DC
  Administered 2019-08-22 – 2019-08-24 (×9): 1 via ORAL
  Filled 2019-08-22 (×9): qty 1

## 2019-08-22 MED ORDER — HEPARIN SODIUM (PORCINE) 5000 UNIT/ML IJ SOLN
5000.0000 [IU] | Freq: Three times a day (TID) | INTRAMUSCULAR | Status: DC
  Administered 2019-08-22 – 2019-08-24 (×6): 5000 [IU] via SUBCUTANEOUS
  Filled 2019-08-22 (×6): qty 1

## 2019-08-22 MED ORDER — DEXAMETHASONE 4 MG PO TABS
4.0000 mg | ORAL_TABLET | Freq: Two times a day (BID) | ORAL | Status: DC
  Administered 2019-08-22 – 2019-08-24 (×5): 4 mg via ORAL
  Filled 2019-08-22 (×5): qty 1

## 2019-08-22 MED ORDER — POLYETHYLENE GLYCOL 3350 17 G PO PACK
17.0000 g | PACK | Freq: Every day | ORAL | Status: DC
  Administered 2019-08-23 – 2019-08-24 (×2): 17 g via ORAL
  Filled 2019-08-22 (×2): qty 1

## 2019-08-22 MED ORDER — METOPROLOL TARTRATE 25 MG PO TABS
25.0000 mg | ORAL_TABLET | Freq: Two times a day (BID) | ORAL | Status: DC
  Administered 2019-08-22 – 2019-08-24 (×5): 25 mg via ORAL
  Filled 2019-08-22 (×5): qty 1

## 2019-08-22 MED ORDER — OXYCODONE HCL 5 MG PO TABS
10.0000 mg | ORAL_TABLET | ORAL | Status: DC | PRN
  Administered 2019-08-22 – 2019-08-23 (×3): 10 mg via ORAL
  Filled 2019-08-22 (×3): qty 2

## 2019-08-22 MED ORDER — PANTOPRAZOLE SODIUM 40 MG PO TBEC
40.0000 mg | DELAYED_RELEASE_TABLET | Freq: Every day | ORAL | Status: DC
  Administered 2019-08-22 – 2019-08-24 (×3): 40 mg via ORAL
  Filled 2019-08-22 (×3): qty 1

## 2019-08-22 MED ORDER — FUROSEMIDE 20 MG PO TABS
20.0000 mg | ORAL_TABLET | Freq: Every day | ORAL | Status: DC
  Administered 2019-08-22 – 2019-08-24 (×3): 20 mg via ORAL
  Filled 2019-08-22 (×3): qty 1

## 2019-08-22 MED ORDER — IPRATROPIUM-ALBUTEROL 0.5-2.5 (3) MG/3ML IN SOLN: 3.0000 mL | RESPIRATORY_TRACT | Status: DC | PRN

## 2019-08-22 MED ORDER — OXYCODONE-ACETAMINOPHEN 5-325 MG PO TABS
1.0000 | ORAL_TABLET | Freq: Once | ORAL | Status: AC
  Administered 2019-08-22: 1 via ORAL
  Filled 2019-08-22: qty 1

## 2019-08-22 MED ORDER — HYDRALAZINE HCL 20 MG/ML IJ SOLN: 5.0000 mg | INTRAMUSCULAR | Status: DC | PRN

## 2019-08-22 MED ORDER — SODIUM CHLORIDE 0.9 % IV BOLUS
1000.0000 mL | Freq: Once | INTRAVENOUS | Status: AC
  Administered 2019-08-22: 1000 mL via INTRAVENOUS
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
## 2019-08-22 NOTE — Progress Notes (Signed)
CRITICAL VALUE ALERT  Critical Value:  Lactic Acid 2.9  Date & Time Notied:  08-22-2019, 0805  Provider Notified: Dr. Perfecto Kingdom  Orders Received/Actions taken: md paged awaiting call back

## 2019-08-22 NOTE — Consult Note (Signed)
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 ANESTHESIA/SEDATION: Fentanyl 100 mcg IV; Versed 2 mg IV Sedation time: 15 minutes; The patient was continuously monitored during the procedure by the interventional radiology nurse under my direct supervision. CONTRAST:  None COMPLICATIONS: None immediate. PROCEDURE: Informed consent was obtained from the patient following an explanation of the procedure, risks, benefits and alternatives. The patient understands,agrees and consents for the procedure. All questions were addressed. A time out was performed prior to the initiation of the procedure. The patient was positioned  right lateral decubitus on the CT table and a limited chest CT was performed for procedural planning demonstrating extensive right-sided pleural thickening with dominant nodular component measuring approximately 12.9 x  5.2 cm (image 58, series 2). The operative site was prepped and draped in the usual sterile fashion. Under sterile conditions and local anesthesia, a 17 gauge coaxial needle was advanced into the peripheral aspect of the nodular pleural thickening. Positioning was confirmed with intermittent CT fluoroscopy and followed by the acquisition of 6 core needle biopsies with an 18 gauge core needle biopsy device. The coaxial needle was removed following deployment of a Biosentry plug and superficial hemostasis was achieved with manual compression. Limited post procedural chest CT was negative for pneumothorax or additional complication. A dressing was placed. The patient tolerated the procedure well without immediate postprocedural complication. The patient was escorted to have an upright chest radiograph. IMPRESSION: Technically successful CT guided core needle core biopsy of indeterminate hypermetabolic right posterior pleural thickening/nodularity. Electronically Signed   By: Sandi Mariscal M.D.   On: 08/16/2019 15:36   DG Chest Port 1 View  Result Date: 08/16/2019 CLINICAL DATA:  Status post biopsy EXAM: PORTABLE CHEST 1 VIEW COMPARISON:  None. FINDINGS: The heart size and mediastinal contours are within normal limits. Aortic knob calcifications. There is diffuse right-sided pleural thickening and nodularity. Within the right mid lung there is diffusely increased interstitial markings and A rounded nodular pulmonary nodule. No pneumothorax is seen. The left lung is clear. No acute osseous abnormality. IMPRESSION: Right pleural nodularity /thickening with a right mid lung pulmonary nodule. No definite pneumothorax. Electronically Signed   By: Prudencio Pair M.D.   On: 08/16/2019 14:49    ASSESSMENT: 58 y.o. [place] man with stage IV lung adenocarcinoma involving lung, brain and bones; also with rectal cancer; admitted with worsening SOB  (1) rectal CA: at some point will need chemo/radiation and surgery  (2)  lung adenocarcinoma: genomic studies pending; they will guide definitive treatment  (3) advanced directives: will ask Chaplain service to assist patient in completing a HCPOA; full code at this point  (4) pain: will shorten dose interval to Q6h and add bowel prophylaxis  (5) other supportive care: will address thrush, reflux symptoms, optimize pulmonary support  PLAN: Jack Daniello is aware of his complex cancer situation and is anticipating systemic therapy for his lung cancer as soon as the pathology/genomic studies are finalized. He will discuss that in detail with Dr Julien Nordmann when data becomes available. Today I am addressing supportive issues noted above including pain, reflux, thrush, SOB and advanced directives. Patient not unreasonably chooses to remain a full code.  Please let me know if I can be of further help at this point.   Fabiano has a good understanding of the overall plan. She agrees with it. She knows the goal of treatment in her case is cure. She will call with any problems that may develop before her next visit here.  Chauncey Cruel, MD   08/22/2019 10:01 AM Medical Oncology and Hematology [facility] [address] [place], [place] [address] Tel. [phone]    Fax. [phone]

## 2019-08-22 NOTE — ED Notes (Signed)
[facility] called to transport patient  

## 2019-08-22 NOTE — ED Notes (Signed)
Report called to [facility]

## 2019-08-22 NOTE — Progress Notes (Signed)
Pt admitted to room 1404, transfer from [facility], A/o x 4, pain 7-8, pt with metz to bone, lung and brain, primary rectal cancer. HOB elevated, menu given to patient, explained call light and bed operation.

## 2019-08-22 NOTE — Plan of Care (Signed)
  Problem: Clinical Measurements: Goal: Ability to maintain clinical measurements within normal limits will improve Outcome: Progressing   Problem: Clinical Measurements: Goal: Will remain free from infection Outcome: Progressing   Problem: Clinical Measurements: Goal: Respiratory complications will improve Outcome: Progressing   Problem: Clinical Measurements: Goal: Cardiovascular complication will be avoided Outcome: Progressing   Problem: Activity: Goal: Risk for activity intolerance will decrease Outcome: Progressing   Problem: Nutrition: Goal: Adequate nutrition will be maintained Outcome: Progressing

## 2019-08-22 NOTE — Progress Notes (Signed)
PROGRESS NOTE    PHEONIX CLINKSCALE  EQA:[id number] DOB: 07-Jul-1961 DOA: 08/21/2019 PCP: Kerin Perna, NP    Brief Narrative:  58 y.o. 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 Patient states he has been feeling very short of breath even with minimal exertion for the past 1 day.  Reports cough productive of clear sputum.  No fevers or chills.  No recent sick contacts or known Covid exposure.  States he was diagnosed with cancer back in October and had a lung biopsy done last week.  He is supposed to start chemo this Wednesday.  He stopped smoking cigarettes a month ago.  No other complaints.  ED Course: Patient was placed on nonrebreather by EMS.  Oxygen saturation dropped to 89 to 90% with ambulation in the ED, currently satting well on 3 L supplemental oxygen.  Afebrile.  WBC count 21.6.  Lactic acid 3.0.  Blood culture x2 pending.  High-sensitivity troponin 20> 21.  EKG not suggestive of ACS.  BNP normal.  SARS-CoV-2 PCR test negative. Chest x-ray showing stable appearance of right lung with volume loss and diffuse right pleural thickening versus small pleural effusion. CT angiogram chest negative for PE.  Showing right lung cancer with circumferential pleural thickening and nodularity, multiple pulmonary nodules, and irregular septal thickening in the upper lobe.  Right hilar nodal disease causing circumferential narrowing of the right upper lobe pulmonary arteries.  There is short segment occlusion of the central right upper lobe bronchus.  Right hilar and mediastinal adenopathy.  Multiple small subpleural nodules throughout the left lung with areas of pleural thickening.  Osseous metastatic disease involving the right first rib and T11 vertebral body.  Assessment & Plan:     Principal Problem:   Acute respiratory failure with hypoxia (HCC) Active Problems:   Rectal adenocarcinoma (HCC)   Adenocarcinoma of left lung, stage 4 (HCC)   COPD (chronic obstructive pulmonary disease) (HCC)   HTN (hypertension)   Acute hypoxic respiratory failure secondary to lung adenocarcinoma -O2 sats stable, had been on 3LC this AM, currently 2LNC -COVID neg -CTA chest reviewed, neg for PE, however notable for known lung cancer -Thus far, pt has reported improvement, most notably after neb tx -Will continue scheduled nebs. Already on decadron -Continue to wean O2 as tolerated. Pt is O2 naive and now still requires [facility]  Metastatic rectal cancer, secondary primary lung adenocarcinoma -Followed by Dr. Julien Nordmann.   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  Recent lung biopsy results concerning for primary lung adenocarcinoma.  Not started on treatment yet. -Pt is to follow up closely with Oncology as scheduled -Continue Norco as needed for pain  COPD -Decreased BS in all lung fields -Continue scheduled neb tx and decadron as already ordered -Pt has reported improvement with nebs, however still requires O2  Hypertension Systolic currently in the 150s to 160s. -Hydralazine as needed for SBP >160 -Norvasc and metoprolol listed on med-rec,will reorder  DVT prophylaxis: Heparin subq Code Status: Full Family Communication: Pt in room, family at bedside Disposition Plan: Uncertain at this time  Consultants:   Oncology   Procedures:     Antimicrobials: Anti-infectives (From admission, onward)   Start     Dose/Rate Route Frequency Ordered Stop   08/22/19 1100  fluconazole (DIFLUCAN) tablet 100 mg     100 mg Oral  Daily 08/22/19 1026 08/27/19 0959       Subjective: Reports breathing better today  Objective: Vitals:   08/22/19 0547 08/22/19 0914 08/22/19 1355 08/22/19 1417  BP: (!) 153/104  (!) 167/105   Pulse: 80 95 90   Resp: 16 16 18    Temp: 97.9 F  (36.6 C)  98 F (36.7 C)   TempSrc: Oral  Oral   SpO2: 100% 98% 96% 96%  Weight:      Height:        Intake/Output Summary (Last 24 hours) at 08/22/2019 1430 Last data filed at 08/22/2019 1245 Gross per 24 hour  Intake 960 ml  Output 700 ml  Net 260 ml   Filed Weights   08/21/19 2026  Weight: 52.2 kg    Examination:  General exam: Appears calm and comfortable  Respiratory system: decreased BS. Respiratory effort normal. Cardiovascular system: S1 & S2 heard, Regular Gastrointestinal system: Abdomen is nondistended, soft and nontender. No organomegaly or masses felt. Normal bowel sounds heard. Central nervous system: Alert and oriented. No focal neurological deficits. Extremities: Symmetric 5 x 5 power. Skin: No rashes, lesions Psychiatry: Judgement and insight appear normal. Mood & affect appropriate.   Data Reviewed: I have personally reviewed following labs and imaging studies  CBC: Recent Labs  Lab 08/16/19 0946 08/21/19 2037  WBC 16.5* 21.6*  NEUTROABS  --  15.8*  HGB 11.8* 12.7*  HCT 35.9* 38.4*  MCV 102.6* 102.9*  PLT 421* 389*   Basic Metabolic Panel: Recent Labs  Lab 08/21/19 2037  NA 140  K 4.1  CL 101  CO2 27  GLUCOSE 106*  BUN 22*  CREATININE 0.75  CALCIUM 9.2   GFR: Estimated Creatinine Clearance: 75.2 mL/min (by C-G formula based on SCr of 0.75 mg/dL). Liver Function Tests: Recent Labs  Lab 08/21/19 2037  AST 23  ALT 19  ALKPHOS 87  BILITOT 0.4  PROT 7.5  ALBUMIN 3.2*   No results for input(s): LIPASE, AMYLASE in the last 168 hours. No results for input(s): AMMONIA in the last 168 hours. Coagulation Profile: Recent Labs  Lab 08/16/19 0946  INR 1.0   Cardiac Enzymes: No results for input(s): CKTOTAL, CKMB, CKMBINDEX, TROPONINI in the last 168 hours. BNP (last 3 results) No results for input(s): PROBNP in the last 8760 hours. HbA1C: No results for input(s): HGBA1C in the last 72 hours. CBG: No results for input(s):  GLUCAP in the last 168 hours. Lipid Profile: No results for input(s): CHOL, HDL, LDLCALC, TRIG, CHOLHDL, LDLDIRECT in the last 72 hours. Thyroid Function Tests: No results for input(s): TSH, T4TOTAL, FREET4, T3FREE, THYROIDAB in the last 72 hours. Anemia Panel: No results for input(s): VITAMINB12, FOLATE, FERRITIN, TIBC, IRON, RETICCTPCT in the last 72 hours. Sepsis Labs: Recent Labs  Lab 08/22/19 0200 08/22/19 0629 08/22/19 0926  LATICACIDVEN 3.0* 2.9* 2.4*    Recent Results (from the past 240 hour(s))  SARS CORONAVIRUS 2 (TAT 6-24 HRS) Nasopharyngeal Nasopharyngeal Swab     Status: None   Collection Time: 08/13/19  2:17 PM   Specimen: Nasopharyngeal Swab  Result Value Ref Range Status   SARS Coronavirus 2 NEGATIVE NEGATIVE Final    Comment: (NOTE) SARS-CoV-2 target nucleic acids are NOT DETECTED. The SARS-CoV-2 RNA is generally detectable in upper and lower respiratory specimens during the acute phase of infection. Negative results do not preclude SARS-CoV-2 infection, do not rule out co-infections with other pathogens, and should not be used as the sole basis for treatment or other patient  management decisions. Negative results must be combined with clinical observations, patient history, and epidemiological information. The expected result is Negative. Fact Sheet for Patients: SugarRoll.be Fact Sheet for Healthcare Providers: https://www.woods-mathews.com/ This test is not yet approved or cleared by the [place] FDA and  has been authorized for detection and/or diagnosis of SARS-CoV-2 by FDA under an Emergency Use Authorization (EUA). This EUA will remain  in effect (meaning this test can be used) for the duration of the COVID-19 declaration under Section 56 4(b)(1) of the Act, 21 U.S.C. section 360bbb-3(b)(1), unless the authorization is terminated or revoked sooner. Performed at [facility], [place] [address],  [place], [place] [address]   SARS CORONAVIRUS 2 (TAT 6-24 HRS) Nasopharyngeal Nasopharyngeal Swab     Status: None   Collection Time: 08/21/19  9:15 PM   Specimen: Nasopharyngeal Swab  Result Value Ref Range Status   SARS Coronavirus 2 NEGATIVE NEGATIVE Final    Comment: (NOTE) SARS-CoV-2 target nucleic acids are NOT DETECTED. The SARS-CoV-2 RNA is generally detectable in upper and lower respiratory specimens during the acute phase of infection. Negative results do not preclude SARS-CoV-2 infection, do not rule out co-infections with other pathogens, and should not be used as the sole basis for treatment or other patient management decisions. Negative results must be combined with clinical observations, patient history, and epidemiological information. The expected result is Negative. Fact Sheet for Patients: SugarRoll.be Fact Sheet for Healthcare Providers: https://www.woods-mathews.com/ This test is not yet approved or cleared by the [place] FDA and  has been authorized for detection and/or diagnosis of SARS-CoV-2 by FDA under an Emergency Use Authorization (EUA). This EUA will remain  in effect (meaning this test can be used) for the duration of the COVID-19 declaration under Section 56 4(b)(1) of the Act, 21 U.S.C. section 360bbb-3(b)(1), unless the authorization is terminated or revoked sooner. Performed at [facility], [place] [address] [place], [place] [address]      Radiology Studies: DG Chest 2 View  Result Date: 08/21/2019 CLINICAL DATA:  Shortness of breath. Lung carcinoma. Currently undergoing chemotherapy. EXAM: CHEST - 2 VIEW COMPARISON:  08/16/2019 FINDINGS: Heart size is normal. Pulmonary hyperinflation again seen, consistent with COPD. Right lung volume loss again demonstrated with diffuse right pleural thickening versus small pleural effusion. No evidence of pneumothorax. No evidence of acute infiltrate or edema.  IMPRESSION: Stable appearance of right lung volume loss and diffuse right pleural thickening versus small pleural effusion. No acute findings. COPD. Electronically Signed   By: Marlaine Hind M.D.   On: 08/21/2019 21:08   CT Angio Chest PE W and/or Wo Contrast  Result Date: 08/21/2019 CLINICAL DATA:  Shortness of breath. Lung cancer. Pleural and bone metastasis. EXAM: CT ANGIOGRAPHY CHEST WITH CONTRAST TECHNIQUE: Multidetector CT imaging of the chest was performed using the standard protocol during bolus administration of intravenous contrast. Multiplanar CT image reconstructions and MIPs were obtained to evaluate the vascular anatomy. CONTRAST:  84mL OMNIPAQUE IOHEXOL 350 MG/ML SOLN COMPARISON:  Radiograph earlier this day. PET CT 07/26/2019 FINDINGS: Cardiovascular: Right hilar nodal disease causes circumferential narrowing of the right upper lobe pulmonary arteries, mass-effect on the right middle lobe pulmonary arteries to a lesser extent. No discrete intraluminal pulmonary arterial filling defects. No pulmonary embolus in the left pulmonary arteries. The thoracic aorta is normal in caliber. No aortic dissection. Heart is normal in size. Left vertebral artery arises directly from the thoracic aorta, variant arch anatomy. Mediastinum/Nodes: Right hilar adenopathy, difficult to compare  to prior PET given differences in technique. Adenopathy causes narrowing of the right upper lobe vasculature. There is occlusion of the central right upper lobe bronchus. Anterior paratracheal adenopathy with largest node measuring 18 mm. Additional paratracheal lymph nodes which are ill-defined. No evidence of esophageal wall thickening. No visualized thyroid nodule Lungs/Pleura: Circumferential pleural thickening and nodularity throughout the right hemithorax. Chronic volume loss in the right hemithorax. Multiple pulmonary nodules in the right upper lobe with nodular interseptal thickening, probable lymphangitic spread of  tumor. There is fissural thickening of the major and minor fissures. Moderate emphysema. Central bronchial thickening in the right upper lobe with focal bronchial occlusion centrally. There is narrowing of the right bronchus intermedius there is a mucous plugging in the right lower lobe bronchi. There multiple small subpleural nodules in pleural thickening in the left lung. These findings may have slightly progressed from prior exam. No pulmonary edema. No evidence of pneumonia Upper Abdomen: No definite acute finding, evaluation limited by paucity of intra-abdominal fat and arterial phase imaging. Musculoskeletal: Destructive lesion involving the central right first rib. Lytic lesion within T11 vertebral body. Medial left clavicle lesion on prior PET is not well demonstrated by CT. No definite new osseous metastatic disease. Review of the MIP images confirms the above findings. IMPRESSION: 1. No pulmonary embolus. 2. Right lung cancer with circumferential pleural thickening and nodularity, multiple pulmonary nodules, and irregular septal thickening in the upper lobe. 3. Right hilar nodal disease causes circumferential narrowing of the right upper lobe pulmonary arteries. There is short segment occlusion of the central right upper lobe bronchus. 4. Right hilar and mediastinal adenopathy, difficult to compare size of lymph nodes to relatively recent PET given differences in technique. 5. Multiple small subpleural nodules throughout the left lung with areas of pleural thickening. Recommend attention to this at follow-up. This is not hypermetabolic on recent PET, however possibly due to lesion size. 6. Osseous metastatic disease involving the right first rib and T11 vertebral body. Medial left clavicle lesion on prior PET is not well demonstrated by CT. Aortic Atherosclerosis (ICD10-I70.0) and Emphysema (ICD10-J43.9). Electronically Signed   By: Keith Rake M.D.   On: 08/21/2019 23:59    Scheduled Meds: .  dexamethasone  4 mg Oral BID  . docusate sodium  100 mg Oral Daily  . fluconazole  100 mg Oral Daily  . furosemide  20 mg Oral Daily  . heparin  5,000 Units Subcutaneous Q8H  . ipratropium-albuterol  3 mL Nebulization Q6H  . oxyCODONE-acetaminophen  1 tablet Oral Q6H  . pantoprazole  40 mg Oral Daily  . polyethylene glycol  17 g Oral Daily   Continuous Infusions:   LOS: 0 days   Marylu Lund, MD [facility] Pager On Amion  If 7PM-7AM, please contact night-coverage 08/22/2019, 2:30 PM

## 2019-08-22 NOTE — ED Notes (Signed)
Pt ambulated at room air. Pt advised that he was feeling short of breath the entire time. Pt SPO2 level after returning from ambulating in the hallway read 90%. Pt placed on a liter of O2. SPO2 of 96% after placed on nasal cannula

## 2019-08-22 NOTE — Progress Notes (Signed)
RN called to room due pt experiencing shortness of breath while attempting to use the urinal. Pt 02 sat was 89 percent on 4l. Rn increased his 02nc to 6l for a brief time. After pt taking good deep breaths and resting as much as possible his 02 sat rose to 95 percent and rn was able to lower 02nc down to 3l. RT called for breathing treatment. Pt with rhonci and wheezes in lungs bilat. Pt states he feels better after resting. Rn will continue to monitor.

## 2019-08-23 ENCOUNTER — Telehealth: Payer: Self-pay | Admitting: Radiation Therapy

## 2019-08-23 LAB — CBC
HCT: 35.3 % — ABNORMAL LOW (ref 39.0–52.0)
Hemoglobin: 11.4 g/dL — ABNORMAL LOW (ref 13.0–17.0)
MCH: 34.1 pg — ABNORMAL HIGH (ref 26.0–34.0)
MCHC: 32.3 g/dL (ref 30.0–36.0)
MCV: 105.7 fL — ABNORMAL HIGH (ref 80.0–100.0)
Platelets: 409 10*3/uL — ABNORMAL HIGH (ref 150–400)
RBC: 3.34 MIL/uL — ABNORMAL LOW (ref 4.22–5.81)
RDW: 14.6 % (ref 11.5–15.5)
WBC: 22.4 10*3/uL — ABNORMAL HIGH (ref 4.0–10.5)
nRBC: 0 % (ref 0.0–0.2)

## 2019-08-23 MED ORDER — ADULT MULTIVITAMIN W/MINERALS CH
1.0000 | ORAL_TABLET | Freq: Every day | ORAL | Status: DC
  Administered 2019-08-23 – 2019-08-24 (×2): 1 via ORAL
  Filled 2019-08-23 (×2): qty 1

## 2019-08-23 MED ORDER — ENSURE ENLIVE PO LIQD
237.0000 mL | Freq: Three times a day (TID) | ORAL | Status: DC
  Administered 2019-08-23 – 2019-08-24 (×4): 237 mL via ORAL

## 2019-08-23 NOTE — Progress Notes (Signed)
Initial Nutrition Assessment  RD working remotely.   DOCUMENTATION CODES:   Underweight  INTERVENTION:  - will order Ensure Enlive po BID, each supplement provides 350 kcal and 20 grams of protein - will order Magic cup TID with meals, each supplement provides 290 kcal and 9 grams of protein - will order daily multivitamin with minerals. - will liberalize diet from Heart Healthy to Regular.   NUTRITION DIAGNOSIS:   Increased nutrient needs related to acute illness, cancer and cancer related treatments as evidenced by estimated needs.  GOAL:   Patient will meet greater than or equal to 90% of their needs  MONITOR:   PO intake, Supplement acceptance, Labs, Weight trends  REASON FOR ASSESSMENT:   Malnutrition Screening Tool  ASSESSMENT:   58 y.o. male with medical history significant of metastatic rectal adenocarcinoma diagnosed in 06/2019, secondary primary lung adenocarcinoma, COPD, HTN, and other comorbidities. He presented to the ED via EMS due to SOB. He reported feeling very SOB even with minimal exertion for the 1 day PTA. He also reported cough productive of clear sputum. Plan PTA was for chemo start 12/23. He stopped smoking 1 month ago.  Per flow sheet documentation, patient consumed 75% of breakfast and lunch and 100% of dinner yesterday; 100% of breakfast this AM. Will order supplements as outlined above to assist in kcal and protein in light of planned chemo start.   Per chart review, current weight is 115 lb and weight has been stable for the past 2.5 months; will continue to monitor trends, especially with chemo start. Suspect at least some degree of malnutrition given BMI of 17.  Per notes: - acute hypoxic respiratory failure 2/2 lung adenocarcinoma--pt has reported improvement with nebulizer treatments - metastatic rectal cancer 2/2 primary lung adenocarcinoma--mets to bone and brain   Labs reviewed. Medications reviewed; 100 mg colace/day, 20 mg oral  lasix/day, 40 mg oral protonix/day, 1 packet miralax/day.      NUTRITION - FOCUSED PHYSICAL EXAM:  unable to complete at this time.  Diet Order:   Diet Order            Diet regular Room service appropriate? Yes; Fluid consistency: Thin  Diet effective now              EDUCATION NEEDS:   Not appropriate for education at this time  Skin:  Skin Assessment: Reviewed RN Assessment  Last BM:  12/20  Height:   Ht Readings from Last 1 Encounters:  08/21/19 5\' 9"  (1.753 m)    Weight:   Wt Readings from Last 1 Encounters:  08/21/19 52.2 kg    Ideal Body Weight:  72.7 kg  BMI:  Body mass index is 16.98 kg/m.  Estimated Nutritional Needs:   Kcal:  2100-2300 kcal  Protein:  110-120 grams  Fluid:  >/= 2.2 L/day     Jarome Matin, MS, RD, LDN, [facility] Inpatient Clinical Dietitian Pager # [phone] After hours/weekend pager # [phone]

## 2019-08-23 NOTE — Progress Notes (Signed)
PROGRESS NOTE    AZAREL BANNER  HQI:[id number] DOB: 06/16/1961 DOA: 08/21/2019 PCP: Kerin Perna, NP    Brief Narrative:  58 y.o. 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 Patient states he has been feeling very short of breath even with minimal exertion for the past 1 day.  Reports cough productive of clear sputum.  No fevers or chills.  No recent sick contacts or known Covid exposure.  States he was diagnosed with cancer back in October and had a lung biopsy done last week.  He is supposed to start chemo this Wednesday.  He stopped smoking cigarettes a month ago.  No other complaints.  ED Course: Patient was placed on nonrebreather by EMS.  Oxygen saturation dropped to 89 to 90% with ambulation in the ED, currently satting well on 3 L supplemental oxygen.  Afebrile.  WBC count 21.6.  Lactic acid 3.0.  Blood culture x2 pending.  High-sensitivity troponin 20> 21.  EKG not suggestive of ACS.  BNP normal.  SARS-CoV-2 PCR test negative. Chest x-ray showing stable appearance of right lung with volume loss and diffuse right pleural thickening versus small pleural effusion. CT angiogram chest negative for PE.  Showing right lung cancer with circumferential pleural thickening and nodularity, multiple pulmonary nodules, and irregular septal thickening in the upper lobe.  Right hilar nodal disease causing circumferential narrowing of the right upper lobe pulmonary arteries.  There is short segment occlusion of the central right upper lobe bronchus.  Right hilar and mediastinal adenopathy.  Multiple small subpleural nodules throughout the left lung with areas of pleural thickening.  Osseous metastatic disease involving the right first rib and T11 vertebral body.  Assessment & Plan:     Principal Problem:   Acute respiratory failure with hypoxia (HCC) Active Problems:   Rectal adenocarcinoma (HCC)   Adenocarcinoma of left lung, stage 4 (HCC)   COPD (chronic obstructive pulmonary disease) (HCC)   HTN (hypertension)   Acute hypoxic respiratory failure secondary to lung adenocarcinoma -O2 sats stable, had been on 3LC, weaned to [facility] this AM -COVID neg -CTA chest reviewed, neg for PE, however notable for known lung cancer -Thus far, pt has reported improvement, most notably after neb tx -Will continue scheduled nebs. Already on decadron -Pt has reported feeling better today. Continue to wean O2 as tolerated  Metastatic rectal cancer, secondary primary lung adenocarcinoma -Followed by Dr. Julien Nordmann.   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  Recent lung biopsy results concerning for primary lung adenocarcinoma.  Not started on treatment yet. -Pt is to follow up closely with Oncology as scheduled -Outpt MRI already scheduled for 12/23 -Continue Norco as needed for pain  COPD -Continue scheduled neb tx and decadron as already ordered -Pt noted clinical improvement thus far -Continue to wean O2 with goal of RA  Hypertension -Norvasc and metoprolol reordered -BP stable  DVT prophylaxis: Heparin subq Code Status: Full Family Communication: Pt in room, family at bedside Disposition Plan: Uncertain at this time  Consultants:   Oncology   Procedures:     Antimicrobials: Anti-infectives (From admission, onward)   Start     Dose/Rate Route Frequency Ordered Stop   08/22/19 1100  fluconazole (DIFLUCAN) tablet 100 mg     100 mg Oral Daily 08/22/19 1026 08/27/19 0959      Subjective: States  breathing better today  Objective: Vitals:   08/23/19 0803 08/23/19 1240 08/23/19 1416 08/23/19 1506  BP:  134/88    Pulse:  61  77  Resp:  16    Temp:  97.7 F (36.5 C)    TempSrc:  Oral    SpO2: 95% 95% 95% 92%  Weight:      Height:         Intake/Output Summary (Last 24 hours) at 08/23/2019 1509 Last data filed at 08/23/2019 0900 Gross per 24 hour  Intake 1080 ml  Output 1585 ml  Net -505 ml   Filed Weights   08/21/19 2026  Weight: 52.2 kg    Examination: General exam: Awake, laying in bed, in nad Respiratory system: Normal respiratory effort, no wheezing, improved air movement Cardiovascular system: regular rate, s1, s2 Gastrointestinal system: Soft, nondistended, positive BS Central nervous system: CN2-12 grossly intact, strength intact Extremities: Perfused, no clubbing Skin: Normal skin turgor, no notable skin lesions seen Psychiatry: Mood normal // no visual hallucinations   Data Reviewed: I have personally reviewed following labs and imaging studies  CBC: Recent Labs  Lab 08/21/19 2037 08/23/19 0518  WBC 21.6* 22.4*  NEUTROABS 15.8*  --   HGB 12.7* 11.4*  HCT 38.4* 35.3*  MCV 102.9* 105.7*  PLT 457* 017*   Basic Metabolic Panel: Recent Labs  Lab 08/21/19 2037  NA 140  K 4.1  CL 101  CO2 27  GLUCOSE 106*  BUN 22*  CREATININE 0.75  CALCIUM 9.2   GFR: Estimated Creatinine Clearance: 75.2 mL/min (by C-G formula based on SCr of 0.75 mg/dL). Liver Function Tests: Recent Labs  Lab 08/21/19 2037  AST 23  ALT 19  ALKPHOS 87  BILITOT 0.4  PROT 7.5  ALBUMIN 3.2*   No results for input(s): LIPASE, AMYLASE in the last 168 hours. No results for input(s): AMMONIA in the last 168 hours. Coagulation Profile: No results for input(s): INR, PROTIME in the last 168 hours. Cardiac Enzymes: No results for input(s): CKTOTAL, CKMB, CKMBINDEX, TROPONINI in the last 168 hours. BNP (last 3 results) No results for input(s): PROBNP in the last 8760 hours. HbA1C: No results for input(s): HGBA1C in the last 72 hours. CBG: No results for input(s): GLUCAP in the last 168 hours. Lipid Profile: No results for input(s): CHOL, HDL, LDLCALC, TRIG, CHOLHDL, LDLDIRECT in the last 72 hours. Thyroid  Function Tests: No results for input(s): TSH, T4TOTAL, FREET4, T3FREE, THYROIDAB in the last 72 hours. Anemia Panel: No results for input(s): VITAMINB12, FOLATE, FERRITIN, TIBC, IRON, RETICCTPCT in the last 72 hours. Sepsis Labs: Recent Labs  Lab 08/22/19 0200 08/22/19 0629 08/22/19 0926  LATICACIDVEN 3.0* 2.9* 2.4*    Recent Results (from the past 240 hour(s))  SARS CORONAVIRUS 2 (TAT 6-24 HRS) Nasopharyngeal Nasopharyngeal Swab     Status: None   Collection Time: 08/21/19  9:15 PM   Specimen: Nasopharyngeal Swab  Result Value Ref Range Status   SARS Coronavirus 2 NEGATIVE NEGATIVE Final    Comment: (NOTE) SARS-CoV-2 target nucleic acids are NOT DETECTED. The SARS-CoV-2 RNA is generally detectable in upper and lower respiratory specimens during the acute phase of infection. Negative results do not preclude SARS-CoV-2 infection, do not rule out co-infections with other pathogens, and should not be used as the sole basis for treatment or other patient management decisions. Negative results must be combined with clinical observations, patient history, and epidemiological information. The expected result is Negative. Fact Sheet for Patients: SugarRoll.be Fact Sheet for Healthcare  Providers: https://www.woods-mathews.com/ This test is not yet approved or cleared by the [place] and  has been authorized for detection and/or diagnosis of SARS-CoV-2 by FDA under an Emergency Use Authorization (EUA). This EUA will remain  in effect (meaning this test can be used) for the duration of the COVID-19 declaration under Section 56 4(b)(1) of the Act, 21 U.S.C. section 360bbb-3(b)(1), unless the authorization is terminated or revoked sooner. Performed at [facility], [place] [address], [place], [place] [address]   Culture, blood (routine x 2)     Status: None (Preliminary result)   Collection Time: 08/22/19  2:00 AM   Specimen: BLOOD   Result Value Ref Range Status   Specimen Description BLOOD SITE NOT SPECIFIED  Final   Special Requests   Final    BOTTLES DRAWN AEROBIC AND ANAEROBIC Blood Culture adequate volume   Culture   Final    NO GROWTH 1 DAY Performed at [facility], [place] [address], [place], [place] [address]    Report Status PENDING  Incomplete  Culture, blood (routine x 2)     Status: None (Preliminary result)   Collection Time: 08/22/19  2:00 AM   Specimen: BLOOD RIGHT FOREARM  Result Value Ref Range Status   Specimen Description BLOOD RIGHT FOREARM  Final   Special Requests   Final    BOTTLES DRAWN AEROBIC AND ANAEROBIC Blood Culture adequate volume   Culture   Final    NO GROWTH 1 DAY Performed at [facility], [place] [address] [place], [place] [address]    Report Status PENDING  Incomplete     Radiology Studies: DG Chest 2 View  Result Date: 08/21/2019 CLINICAL DATA:  Shortness of breath. Lung carcinoma. Currently undergoing chemotherapy. EXAM: CHEST - 2 VIEW COMPARISON:  08/16/2019 FINDINGS: Heart size is normal. Pulmonary hyperinflation again seen, consistent with COPD. Right lung volume loss again demonstrated with diffuse right pleural thickening versus small pleural effusion. No evidence of pneumothorax. No evidence of acute infiltrate or edema. IMPRESSION: Stable appearance of right lung volume loss and diffuse right pleural thickening versus small pleural effusion. No acute findings. COPD. Electronically Signed   By: Marlaine Hind M.D.   On: 08/21/2019 21:08   CT Angio Chest PE W and/or Wo Contrast  Result Date: 08/21/2019 CLINICAL DATA:  Shortness of breath. Lung cancer. Pleural and bone metastasis. EXAM: CT ANGIOGRAPHY CHEST WITH CONTRAST TECHNIQUE: Multidetector CT imaging of the chest was performed using the standard protocol during bolus administration of intravenous contrast. Multiplanar CT image reconstructions and MIPs were obtained to evaluate the vascular anatomy.  CONTRAST:  37mL OMNIPAQUE IOHEXOL 350 MG/ML SOLN COMPARISON:  Radiograph earlier this day. PET CT 07/26/2019 FINDINGS: Cardiovascular: Right hilar nodal disease causes circumferential narrowing of the right upper lobe pulmonary arteries, mass-effect on the right middle lobe pulmonary arteries to a lesser extent. No discrete intraluminal pulmonary arterial filling defects. No pulmonary embolus in the left pulmonary arteries. The thoracic aorta is normal in caliber. No aortic dissection. Heart is normal in size. Left vertebral artery arises directly from the thoracic aorta, variant arch anatomy. Mediastinum/Nodes: Right hilar adenopathy, difficult to compare to prior PET given differences in technique. Adenopathy causes narrowing of the right upper lobe vasculature. There is occlusion of the central right upper lobe bronchus. Anterior paratracheal adenopathy with largest node measuring 18 mm. Additional paratracheal lymph nodes which are ill-defined. No evidence of esophageal wall thickening. No visualized thyroid nodule Lungs/Pleura: Circumferential pleural thickening  and nodularity throughout the right hemithorax. Chronic volume loss in the right hemithorax. Multiple pulmonary nodules in the right upper lobe with nodular interseptal thickening, probable lymphangitic spread of tumor. There is fissural thickening of the major and minor fissures. Moderate emphysema. Central bronchial thickening in the right upper lobe with focal bronchial occlusion centrally. There is narrowing of the right bronchus intermedius there is a mucous plugging in the right lower lobe bronchi. There multiple small subpleural nodules in pleural thickening in the left lung. These findings may have slightly progressed from prior exam. No pulmonary edema. No evidence of pneumonia Upper Abdomen: No definite acute finding, evaluation limited by paucity of intra-abdominal fat and arterial phase imaging. Musculoskeletal: Destructive lesion involving  the central right first rib. Lytic lesion within T11 vertebral body. Medial left clavicle lesion on prior PET is not well demonstrated by CT. No definite new osseous metastatic disease. Review of the MIP images confirms the above findings. IMPRESSION: 1. No pulmonary embolus. 2. Right lung cancer with circumferential pleural thickening and nodularity, multiple pulmonary nodules, and irregular septal thickening in the upper lobe. 3. Right hilar nodal disease causes circumferential narrowing of the right upper lobe pulmonary arteries. There is short segment occlusion of the central right upper lobe bronchus. 4. Right hilar and mediastinal adenopathy, difficult to compare size of lymph nodes to relatively recent PET given differences in technique. 5. Multiple small subpleural nodules throughout the left lung with areas of pleural thickening. Recommend attention to this at follow-up. This is not hypermetabolic on recent PET, however possibly due to lesion size. 6. Osseous metastatic disease involving the right first rib and T11 vertebral body. Medial left clavicle lesion on prior PET is not well demonstrated by CT. Aortic Atherosclerosis (ICD10-I70.0) and Emphysema (ICD10-J43.9). Electronically Signed   By: Keith Rake M.D.   On: 08/21/2019 23:59    Scheduled Meds: . amLODipine  10 mg Oral Daily  . dexamethasone  4 mg Oral BID  . docusate sodium  100 mg Oral Daily  . feeding supplement (ENSURE ENLIVE)  237 mL Oral TID BM  . fluconazole  100 mg Oral Daily  . furosemide  20 mg Oral Daily  . heparin  5,000 Units Subcutaneous Q8H  . ipratropium-albuterol  3 mL Nebulization Q6H  . metoprolol tartrate  25 mg Oral BID  . multivitamin with minerals  1 tablet Oral Daily  . oxyCODONE-acetaminophen  1 tablet Oral Q6H  . pantoprazole  40 mg Oral Daily  . polyethylene glycol  17 g Oral Daily   Continuous Infusions:   LOS: 1 day   Marylu Lund, MD [facility] Pager On Amion  If 7PM-7AM, please  contact night-coverage 08/23/2019, 3:09 PM

## 2019-08-23 NOTE — Progress Notes (Signed)
I responded to spiritual care consult for an Jack Lester was awake and talking. He said his wife just left the Hospital around noon today and doesn't know when she will be back because she works during the day. [name] asked me for a copy of the Ad. I gave him one and a brief overview of filling it out. He said he wanted to talk it over with his wife. He said he will let Nurse know when he is ready for notary and to page Chaplain for further assistance.   I offered [name] space to voice concerns and questions. He said that he feels good today and that he is getting a lot of sleep. He did voice that he understands that he is sick, but says that he has hope in God. I was able to offer [name] spiritual care with compassionate listening, ministry of presence, words of comfort and prayer. [name] stated he was thankful for the pastoral presence. Chaplain available as needed.   Chaplain Resident  Fidel Levy MA [phone]

## 2019-08-23 NOTE — Telephone Encounter (Signed)
Spoke with floor nurse requesting that pt's 3T SRS Protocol brain MRI be done during his hospital stay so his treatment is not delayed. Nurse is going to talk with the hospitalist, Dr. Wyline Copas, about this. Hopefully this will be done either Monday or Tuesday in order to move forward with planned simulation on Thursday 12/24.    Mont Dutton R.T.(R)(T) Radiation Special Procedures Navigator  [phone]

## 2019-08-23 NOTE — Progress Notes (Signed)
Attempted to wean to room air. Patient tolerated for 30 minutes recheck sat for 91-92 on RA. Patient states I cant talk or eat off the oxygen I just have to concentrate on my breathing. Restarted o2 @1  liter. Bethann Punches RN

## 2019-08-24 ENCOUNTER — Encounter: Payer: Self-pay | Admitting: *Deleted

## 2019-08-24 DIAGNOSIS — C3492 Malignant neoplasm of unspecified part of left bronchus or lung: Secondary | ICD-10-CM

## 2019-08-24 MED ORDER — IPRATROPIUM-ALBUTEROL 0.5-2.5 (3) MG/3ML IN SOLN: 3.0000 mL | RESPIRATORY_TRACT | 0 refills | Status: DC | PRN

## 2019-08-24 MED ORDER — FLUCONAZOLE 100 MG PO TABS: 100.0000 mg | ORAL_TABLET | Freq: Every day | ORAL | 0 refills | Status: AC

## 2019-08-24 MED ORDER — DOCUSATE SODIUM 100 MG PO CAPS: 100.0000 mg | ORAL_CAPSULE | Freq: Every day | ORAL | 0 refills | Status: DC

## 2019-08-24 MED ORDER — FUROSEMIDE 20 MG PO TABS: 20.0000 mg | ORAL_TABLET | Freq: Every day | ORAL | 0 refills | Status: DC

## 2019-08-24 NOTE — Progress Notes (Signed)
SATURATION QUALIFICATIONS: (This note is used to comply with regulatory documentation for home oxygen)  Patient Saturations on Room Air at Rest = 89%  Patient Saturations on Room Air while Ambulating = 86%  Patient Saturations on 2 Liters of oxygen while Ambulating = 92%  Please briefly explain why patient needs home oxygen:  Patient's oxygenation decreases under 90% gradually at rest but dramatically while ambulating. Experiences shortness of breath and tightness as well. Once on oxygen, patient maintains oxygen levels above 90% and tolerates ambulation much better.

## 2019-08-24 NOTE — Progress Notes (Signed)
Ambulation test for oxygen needs completed.   Patient oxygen saturation at 94% on 1lpm nasal cannula laying in bed. Assisted to sitting position, removed O2, saturation down to 89%. Ambulated 155ft over one minute, oxygen saturation down to 86% on room air, patient symptomatic, SOB. Oxygen placed per nasal cannula at 2LPM, saturation up to 92%, able to ambulate to room. Placed on O2 at 2LPM in room. Will cont to monitor.

## 2019-08-24 NOTE — Discharge Summary (Signed)
Physician Discharge Summary  Jack Lester GUR:[id number] DOB: 1961-06-07 DOA: 08/21/2019  PCP: Kerin Perna, NP  Admit date: 08/21/2019 Discharge date: 08/24/2019  Admitted From: Home Disposition:  Home  Recommendations for Outpatient Follow-up:  1. Follow up with PCP in 1-2 weeks 2. Follow up with Oncology as scheduled  Equipment/Devices:Home O2, Neb    Discharge Condition:Improved CODE STATUS:Full Diet recommendation: Regular   Brief/Interim Summary: 58 y.o.malewith medical history significant ofmetastatic rectal adenocarcinoma diagnosed in October 2020, secondary primary lungadenocarcinoma, COPD, hypertension, and conditions listed below presenting to the ED via EMS for evaluation of shortness of breath. Patient states he has been feeling very short of breath even with minimal exertion for the past 1 day. Reports cough productive of clear sputum. No fevers or chills. No recent sick contacts or known Covid exposure.States he was diagnosed with cancer back in October and had a lung biopsy done last week. He is supposed to start chemo this Wednesday. He stopped smoking cigarettes a month ago. No other complaints.  ED Course:Patient was placed on nonrebreather by EMS. Oxygen saturation dropped to 89 to 90% with ambulation in the ED, currently satting well on 3 L supplemental oxygen. Afebrile. WBC count 21.6. Lactic acid 3.0. Blood culture x2 pending. High-sensitivity troponin 20> 21.EKG not suggestive of ACS. BNP normal. SARS-CoV-2 PCR test negative. Chest x-ray showing stable appearance of right lung with volume loss and diffuse right pleural thickening versus small pleural effusion. CT angiogram chest negative for PE. Showing right lung cancer with circumferential pleural thickening and nodularity, multiple pulmonary nodules, and irregular septal thickening in the upper lobe. Right hilar nodal disease causing circumferential narrowing of the right upper lobe  pulmonary arteries. There is short segment occlusion of the central right upper lobe bronchus. Right hilar and mediastinal adenopathy. Multiple small subpleural nodules throughout the left lung with areas of pleural thickening. Osseous metastatic disease involving the right first rib and T11 vertebral body.   Discharge Diagnoses:  Principal Problem:   Acute respiratory failure with hypoxia (HCC) Active Problems:   Rectal adenocarcinoma (HCC)   Adenocarcinoma of left lung, stage 4 (HCC)   COPD (chronic obstructive pulmonary disease) (HCC)   HTN (hypertension)  Acute hypoxic respiratory failure secondary to lung adenocarcinoma -COVID neg -Pt had been on 3LC, weaned to 0.5LNC, however pt reports feeling increased sob and fatigue -Wheezing resolved -CTA chest reviewed, neg for PE, however notable for known lung cancer -Thus far, pt has reported improvement, most notably after neb tx -Pt was continued on scheduled nebs. Already on decadron -On ambulation, pt with O2 sat of 89% on rest, 86% on ambulation on RA, and improved to 92% on [facility] -Will discharge on [facility] supplemental O2 and arrange home nebulizer  Metastatic rectal cancer, secondary primary lung adenocarcinoma -Followed by Dr. Julien Nordmann.  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 Recent lung biopsy results concerning for primary lung adenocarcinoma. Not started on treatment yet. -Pt is to follow up closely with Oncology as scheduled -Outpt MRI already scheduled for 12/23 -Continued Norco in hospital as needed for pain  COPD -Continue scheduled neb tx and decadron as already ordered -Pt noted clinical improvement thus far -see above, will need supplemental home O2 on d/c  Hypertension -Norvasc and metoprolol reordered -BP stable  Discharge Instructions   Allergies as of 08/24/2019   No Known Allergies     Medication List    STOP taking these medications   hydrochlorothiazide 25 MG tablet Commonly known  as: HYDRODIURIL  nicotine 21 mg/24hr patch Commonly known as: Nicoderm CQ     TAKE these medications   amLODipine 10 MG tablet Commonly known as: NORVASC TAKE 1 TABLET BY MOUTH EVERY DAY   dexamethasone 4 MG tablet Commonly known as: DECADRON Take 1 tablet (4 mg total) by mouth 2 (two) times daily.   docusate sodium 100 MG capsule Commonly known as: COLACE Take 1 capsule (100 mg total) by mouth daily. Start taking on: August 25, 2019   fluconazole 100 MG tablet Commonly known as: DIFLUCAN Take 1 tablet (100 mg total) by mouth daily for 3 days. Start taking on: August 25, 2019   Fluticasone-Salmeterol 250-50 MCG/DOSE Aepb Commonly known as: ADVAIR Inhale 1 puff into the lungs 2 (two) times daily.   furosemide 20 MG tablet Commonly known as: LASIX Take 1 tablet (20 mg total) by mouth daily. Start taking on: August 25, 2019   ibuprofen 800 MG tablet Commonly known as: ADVIL Take 1 tablet (800 mg total) by mouth 3 (three) times daily.   ipratropium-albuterol 0.5-2.5 (3) MG/3ML Soln Commonly known as: DUONEB Take 3 mLs by nebulization every 4 (four) hours as needed.   metoprolol tartrate 25 MG tablet Commonly known as: LOPRESSOR Take 1 tablet (25 mg total) by mouth 2 (two) times daily.   omeprazole 40 MG capsule Commonly known as: PRILOSEC Take 1 capsule (40 mg total) by mouth daily.   oxyCODONE-acetaminophen 5-325 MG tablet Commonly known as: PERCOCET/ROXICET Take 1 tablet by mouth every 8 (eight) hours as needed for severe pain.   ProAir RespiClick 948 (90 Base) MCG/ACT Aepb Generic drug: Albuterol Sulfate Inhale 1-2 puffs into the lungs every 6 (six) hours as needed.            Durable Medical Equipment  (From admission, onward)         Start     Ordered   08/24/19 1229  For home use only DME Nebulizer machine  Once    Question Answer Comment  Patient needs a nebulizer to treat with the following condition COPD (chronic obstructive pulmonary  disease) ([place])   Length of Need Lifetime      08/24/19 1228   08/24/19 1219  For home use only DME oxygen  Once    Question Answer Comment  Length of Need 6 Months   Mode or (Route) Nasal cannula   Liters per Minute 2   Frequency Continuous (stationary and portable oxygen unit needed)   Oxygen delivery system Gas      08/24/19 1219         Follow-up Information    Kerin Perna, NP. Schedule an appointment as soon as possible for a visit in 1 week(s).   Specialty: Internal Medicine Contact information: [address] [address] [facility] Follow up.   Why: oxygen/nebulizer machine Contact information: [place] [address] [phone]          No Known Allergies  Consultations:  Oncology  Procedures/Studies: DG Chest 2 View  Result Date: 08/21/2019 CLINICAL DATA:  Shortness of breath. Lung carcinoma. Currently undergoing chemotherapy. EXAM: CHEST - 2 VIEW COMPARISON:  08/16/2019 FINDINGS: Heart size is normal. Pulmonary hyperinflation again seen, consistent with COPD. Right lung volume loss again demonstrated with diffuse right pleural thickening versus small pleural effusion. No evidence of pneumothorax. No evidence of acute infiltrate or edema. IMPRESSION: Stable appearance of right lung volume loss and diffuse right pleural thickening  versus small pleural effusion. No acute findings. COPD. Electronically Signed   By: Marlaine Hind M.D.   On: 08/21/2019 21:08   CT Angio Chest PE W and/or Wo Contrast  Result Date: 08/21/2019 CLINICAL DATA:  Shortness of breath. Lung cancer. Pleural and bone metastasis. EXAM: CT ANGIOGRAPHY CHEST WITH CONTRAST TECHNIQUE: Multidetector CT imaging of the chest was performed using the standard protocol during bolus administration of intravenous contrast. Multiplanar CT image reconstructions and MIPs were obtained to evaluate the vascular anatomy. CONTRAST:  43mL  OMNIPAQUE IOHEXOL 350 MG/ML SOLN COMPARISON:  Radiograph earlier this day. PET CT 07/26/2019 FINDINGS: Cardiovascular: Right hilar nodal disease causes circumferential narrowing of the right upper lobe pulmonary arteries, mass-effect on the right middle lobe pulmonary arteries to a lesser extent. No discrete intraluminal pulmonary arterial filling defects. No pulmonary embolus in the left pulmonary arteries. The thoracic aorta is normal in caliber. No aortic dissection. Heart is normal in size. Left vertebral artery arises directly from the thoracic aorta, variant arch anatomy. Mediastinum/Nodes: Right hilar adenopathy, difficult to compare to prior PET given differences in technique. Adenopathy causes narrowing of the right upper lobe vasculature. There is occlusion of the central right upper lobe bronchus. Anterior paratracheal adenopathy with largest node measuring 18 mm. Additional paratracheal lymph nodes which are ill-defined. No evidence of esophageal wall thickening. No visualized thyroid nodule Lungs/Pleura: Circumferential pleural thickening and nodularity throughout the right hemithorax. Chronic volume loss in the right hemithorax. Multiple pulmonary nodules in the right upper lobe with nodular interseptal thickening, probable lymphangitic spread of tumor. There is fissural thickening of the major and minor fissures. Moderate emphysema. Central bronchial thickening in the right upper lobe with focal bronchial occlusion centrally. There is narrowing of the right bronchus intermedius there is a mucous plugging in the right lower lobe bronchi. There multiple small subpleural nodules in pleural thickening in the left lung. These findings may have slightly progressed from prior exam. No pulmonary edema. No evidence of pneumonia Upper Abdomen: No definite acute finding, evaluation limited by paucity of intra-abdominal fat and arterial phase imaging. Musculoskeletal: Destructive lesion involving the central  right first rib. Lytic lesion within T11 vertebral body. Medial left clavicle lesion on prior PET is not well demonstrated by CT. No definite new osseous metastatic disease. Review of the MIP images confirms the above findings. IMPRESSION: 1. No pulmonary embolus. 2. Right lung cancer with circumferential pleural thickening and nodularity, multiple pulmonary nodules, and irregular septal thickening in the upper lobe. 3. Right hilar nodal disease causes circumferential narrowing of the right upper lobe pulmonary arteries. There is short segment occlusion of the central right upper lobe bronchus. 4. Right hilar and mediastinal adenopathy, difficult to compare size of lymph nodes to relatively recent PET given differences in technique. 5. Multiple small subpleural nodules throughout the left lung with areas of pleural thickening. Recommend attention to this at follow-up. This is not hypermetabolic on recent PET, however possibly due to lesion size. 6. Osseous metastatic disease involving the right first rib and T11 vertebral body. Medial left clavicle lesion on prior PET is not well demonstrated by CT. Aortic Atherosclerosis (ICD10-I70.0) and Emphysema (ICD10-J43.9). Electronically Signed   By: Keith Rake M.D.   On: 08/21/2019 23:59   MR BRAIN W WO CONTRAST  Result Date: 08/13/2019 CLINICAL DATA:  Non-small cell lung cancer. Staging. Recent diagnosis of rectal adenocarcinoma. EXAM: MRI HEAD WITHOUT AND WITH CONTRAST TECHNIQUE: Multiplanar, multiecho pulse sequences of the brain and surrounding structures were obtained without and with  intravenous contrast. CONTRAST:  51mL GADAVIST GADOBUTROL 1 MMOL/ML IV SOLN COMPARISON:  CT 01/24/2015.  MRI 11/03/2013. FINDINGS: Brain: 7 mm centrally necrotic metastasis in the left frontal lobe with regional vasogenic edema. No significant mass effect or shift. No second metastasis is identified. Mild chronic small-vessel ischemic changes affect the cerebral hemispheric  white matter. Brainstem and cerebellum are normal. No hydrocephalus. No extra-axial collection. Incidental venous angioma left temporal tip. No abnormal contrast enhancement elsewhere. Vascular: Major vessels at the base of the brain show flow. Skull and upper cervical spine: Negative Sinuses/Orbits: Clear/normal Other: None IMPRESSION: 7 mm centrally necrotic metastasis in the left frontal lobe with vasogenic edema. No significant mass-effect, shift or hemorrhage. No second lesion. Electronically Signed   By: Nelson Chimes M.D.   On: 08/13/2019 09:56   NM PET Image Initial (PI) Skull Base To Thigh  Result Date: 07/26/2019 CLINICAL DATA:  Initial treatment strategy for rectal carcinoma. EXAM: NUCLEAR MEDICINE PET SKULL BASE TO THIGH TECHNIQUE: 6.0 mCi F-18 FDG was injected intravenously. Full-ring PET imaging was performed from the skull base to thigh after the radiotracer. CT data was obtained and used for attenuation correction and anatomic localization. Fasting blood glucose: 99 mg/dl COMPARISON:  AP CT on 06/29/2019 FINDINGS: Mediastinal blood-pool activity (background): SUV max = 1.3 Liver activity (reference): SUV max = N/A NECK:  No hypermetabolic lymph nodes or masses. Incidental CT findings:  Muscular activity noted in lower neck. CHEST: Hypermetabolic lymphadenopathy is seen in the right paratracheal region, which measures 2.6 cm short axis on image 74/4, with SUV max of 9.2. Hypermetabolic right hilar lymphadenopathy has SUV max of 10.0. A 1.0 cm right internal mammary chain lymph node on image 53/9 is hypermetabolic, with SUV max of 7.3. Diffuse hypermetabolic soft tissue density is also seen throughout the right pleural space, consistent with malignancy. Mild hypermetabolic activity is also seen along the left hemidiaphragm, without associated soft tissue density, and this could be due to muscular activity or carcinoma. No evidence of pleural effusion. Moderate emphysema is seen. Multiple pulmonary  nodules are seen in the right upper lobe measuring up to 9 mm which are hypermetabolic, consistent with metastatic disease. There is associated interstitial thickening in the right upper lobe, which may be due to lymphangitic spread of carcinoma. Incidental CT findings: Muscular activity seen throughout intercostal muscles bilaterally. Moderate emphysema. ABDOMEN/PELVIS: No abnormal hypermetabolic activity within the liver, pancreas, adrenal glands, or spleen. No hypermetabolic lymph nodes in the abdomen or pelvis. Rectal soft tissue mass is seen which is hypermetabolic, with SUV max of 17.4, consistent with primary rectal carcinoma. Incidental CT findings:  None. SKELETON: Multiple hypermetabolic lytic bone metastases are seen throughout the spine, pelvis, right posterior 1st rib, and medial left clavicle. Incidental CT findings:  None. IMPRESSION: Hypermetabolic rectal mass, consistent with known primary rectal carcinoma. No soft tissue metastatic disease identified within the abdomen or pelvis. Hypermetabolic mediastinal and right hilar lymphadenopathy, consistent with malignancy. Diffuse right pleural soft tissue thickening which is hypermetabolic, consistent with pleural metastatic disease. Mild hypermetabolic activity along left hemidiaphragm may be muscular, although metastatic disease cannot be excluded. No evidence of pleural effusion. Small hypermetabolic right upper lobe pulmonary nodules and interstitial thickening, consistent with metastatic disease, and suspicious for lymphangitic spread of carcinoma. Diffuse bone metastases. Electronically Signed   By: Marlaine Hind M.D.   On: 07/26/2019 15:51   CT Biopsy  Result Date: 08/16/2019 INDICATION: History of rectal adenocarcinoma, now with hypermetabolic right-sided pleural thickening worrisome for metastatic disease versus  new primary malignancy. Please perform CT-guided biopsy for tissue diagnostic purposes. EXAM: CT-GUIDED RIGHT PLEURAL  THICKENING/NODULE BIOPSY COMPARISON:  PET-CT-08/16/2019 MEDICATIONS: None. ANESTHESIA/SEDATION: Fentanyl 100 mcg IV; Versed 2 mg IV Sedation time: 15 minutes; The patient was continuously monitored during the procedure by the interventional radiology nurse under my direct supervision. CONTRAST:  None COMPLICATIONS: None immediate. PROCEDURE: Informed consent was obtained from the patient following an explanation of the procedure, risks, benefits and alternatives. The patient understands,agrees and consents for the procedure. All questions were addressed. A time out was performed prior to the initiation of the procedure. The patient was positioned right lateral decubitus on the CT table and a limited chest CT was performed for procedural planning demonstrating extensive right-sided pleural thickening with dominant nodular component measuring approximately 12.9 x 5.2 cm (image 58, series 2). The operative site was prepped and draped in the usual sterile fashion. Under sterile conditions and local anesthesia, a 17 gauge coaxial needle was advanced into the peripheral aspect of the nodular pleural thickening. Positioning was confirmed with intermittent CT fluoroscopy and followed by the acquisition of 6 core needle biopsies with an 18 gauge core needle biopsy device. The coaxial needle was removed following deployment of a [employer] plug and superficial hemostasis was achieved with manual compression. Limited post procedural chest CT was negative for pneumothorax or additional complication. A dressing was placed. The patient tolerated the procedure well without immediate postprocedural complication. The patient was escorted to have an upright chest radiograph. IMPRESSION: Technically successful CT guided core needle core biopsy of indeterminate hypermetabolic right posterior pleural thickening/nodularity. Electronically Signed   By: Sandi Mariscal M.D.   On: 08/16/2019 15:36   DG Chest Port 1 View  Result Date:  08/16/2019 CLINICAL DATA:  Status post biopsy EXAM: PORTABLE CHEST 1 VIEW COMPARISON:  None. FINDINGS: The heart size and mediastinal contours are within normal limits. Aortic knob calcifications. There is diffuse right-sided pleural thickening and nodularity. Within the right mid lung there is diffusely increased interstitial markings and A rounded nodular pulmonary nodule. No pneumothorax is seen. The left lung is clear. No acute osseous abnormality. IMPRESSION: Right pleural nodularity /thickening with a right mid lung pulmonary nodule. No definite pneumothorax. Electronically Signed   By: Prudencio Pair M.D.   On: 08/16/2019 14:49    Subjective: Eager to go home  Discharge Exam: Vitals:   08/24/19 0451 08/24/19 0755  BP: (!) 147/96   Pulse: 70   Resp: 18   Temp: 97.9 F (36.6 C)   SpO2: 97% 96%   Vitals:   08/23/19 2027 08/24/19 0119 08/24/19 0451 08/24/19 0755  BP: (!) 147/95  (!) 147/96   Pulse: 73  70   Resp: 18  18   Temp: 98 F (36.7 C)  97.9 F (36.6 C)   TempSrc: Oral  Oral   SpO2: 92% 96% 97% 96%  Weight:      Height:        General: Pt is alert, awake, not in acute distress Cardiovascular: RRR, S1/S2 +, no rubs, no gallops Respiratory: CTA bilaterally, no wheezing, no rhonchi Abdominal: Soft, NT, ND, bowel sounds + Extremities: no edema, no cyanosis   The results of significant diagnostics from this hospitalization (including imaging, microbiology, ancillary and laboratory) are listed below for reference.     Microbiology: Recent Results (from the past 240 hour(s))  SARS CORONAVIRUS 2 (TAT 6-24 HRS) Nasopharyngeal Nasopharyngeal Swab     Status: None   Collection Time: 08/21/19  9:15 PM  Specimen: Nasopharyngeal Swab  Result Value Ref Range Status   SARS Coronavirus 2 NEGATIVE NEGATIVE Final    Comment: (NOTE) SARS-CoV-2 target nucleic acids are NOT DETECTED. The SARS-CoV-2 RNA is generally detectable in upper and lower respiratory specimens during the  acute phase of infection. Negative results do not preclude SARS-CoV-2 infection, do not rule out co-infections with other pathogens, and should not be used as the sole basis for treatment or other patient management decisions. Negative results must be combined with clinical observations, patient history, and epidemiological information. The expected result is Negative. Fact Sheet for Patients: SugarRoll.be Fact Sheet for Healthcare Providers: https://www.woods-mathews.com/ This test is not yet approved or cleared by the [place] FDA and  has been authorized for detection and/or diagnosis of SARS-CoV-2 by FDA under an Emergency Use Authorization (EUA). This EUA will remain  in effect (meaning this test can be used) for the duration of the COVID-19 declaration under Section 56 4(b)(1) of the Act, 21 U.S.C. section 360bbb-3(b)(1), unless the authorization is terminated or revoked sooner. Performed at [facility], [place] [address], [place], [place] [address]   Culture, blood (routine x 2)     Status: None (Preliminary result)   Collection Time: 08/22/19  2:00 AM   Specimen: BLOOD  Result Value Ref Range Status   Specimen Description BLOOD SITE NOT SPECIFIED  Final   Special Requests   Final    BOTTLES DRAWN AEROBIC AND ANAEROBIC Blood Culture adequate volume   Culture   Final    NO GROWTH 2 DAYS Performed at [facility], [address], [place], [place] [address]    Report Status PENDING  Incomplete  Culture, blood (routine x 2)     Status: None (Preliminary result)   Collection Time: 08/22/19  2:00 AM   Specimen: BLOOD RIGHT FOREARM  Result Value Ref Range Status   Specimen Description BLOOD RIGHT FOREARM  Final   Special Requests   Final    BOTTLES DRAWN AEROBIC AND ANAEROBIC Blood Culture adequate volume   Culture   Final    NO GROWTH 2 DAYS Performed at [facility], [place] [address], [place], [place] [address]     Report Status PENDING  Incomplete     Labs: BNP (last 3 results) Recent Labs    08/21/19 2037  BNP 90.2   Basic Metabolic Panel: Recent Labs  Lab 08/21/19 2037  NA 140  K 4.1  CL 101  CO2 27  GLUCOSE 106*  BUN 22*  CREATININE 0.75  CALCIUM 9.2   Liver Function Tests: Recent Labs  Lab 08/21/19 2037  AST 23  ALT 19  ALKPHOS 87  BILITOT 0.4  PROT 7.5  ALBUMIN 3.2*   No results for input(s): LIPASE, AMYLASE in the last 168 hours. No results for input(s): AMMONIA in the last 168 hours. CBC: Recent Labs  Lab 08/21/19 2037 08/23/19 0518  WBC 21.6* 22.4*  NEUTROABS 15.8*  --   HGB 12.7* 11.4*  HCT 38.4* 35.3*  MCV 102.9* 105.7*  PLT 457* 409*   Cardiac Enzymes: No results for input(s): CKTOTAL, CKMB, CKMBINDEX, TROPONINI in the last 168 hours. BNP: Invalid input(s): POCBNP CBG: No results for input(s): GLUCAP in the last 168 hours. D-Dimer No results for input(s): DDIMER in the last 72 hours. Hgb A1c No results for input(s): HGBA1C in the last 72 hours. Lipid Profile No results for input(s): CHOL, HDL, LDLCALC, TRIG, CHOLHDL, LDLDIRECT in the last 72 hours. Thyroid function  studies No results for input(s): TSH, T4TOTAL, T3FREE, THYROIDAB in the last 72 hours.  Invalid input(s): FREET3 Anemia work up No results for input(s): VITAMINB12, FOLATE, FERRITIN, TIBC, IRON, RETICCTPCT in the last 72 hours. Urinalysis    Component Value Date/Time   COLORURINE YELLOW 04/04/2010 0059   APPEARANCEUR CLOUDY (A) 04/04/2010 0059   LABSPEC 1.005 04/04/2010 0059   PHURINE 5.0 04/04/2010 0059   GLUCOSEU NEGATIVE 04/04/2010 0059   HGBUR NEGATIVE 04/04/2010 0059   BILIRUBINUR NEGATIVE 04/04/2010 0059   KETONESUR NEGATIVE 04/04/2010 0059   PROTEINUR NEGATIVE 04/04/2010 0059   UROBILINOGEN 0.2 04/04/2010 0059   NITRITE NEGATIVE 04/04/2010 0059   LEUKOCYTESUR  04/04/2010 0059    NEGATIVE MICROSCOPIC NOT DONE ON URINES WITH NEGATIVE PROTEIN, BLOOD, LEUKOCYTES,  NITRITE, OR GLUCOSE <1000 mg/dL.   Sepsis Labs Invalid input(s): PROCALCITONIN,  WBC,  LACTICIDVEN Microbiology Recent Results (from the past 240 hour(s))  SARS CORONAVIRUS 2 (TAT 6-24 HRS) Nasopharyngeal Nasopharyngeal Swab     Status: None   Collection Time: 08/21/19  9:15 PM   Specimen: Nasopharyngeal Swab  Result Value Ref Range Status   SARS Coronavirus 2 NEGATIVE NEGATIVE Final    Comment: (NOTE) SARS-CoV-2 target nucleic acids are NOT DETECTED. The SARS-CoV-2 RNA is generally detectable in upper and lower respiratory specimens during the acute phase of infection. Negative results do not preclude SARS-CoV-2 infection, do not rule out co-infections with other pathogens, and should not be used as the sole basis for treatment or other patient management decisions. Negative results must be combined with clinical observations, patient history, and epidemiological information. The expected result is Negative. Fact Sheet for Patients: SugarRoll.be Fact Sheet for Healthcare Providers: https://www.woods-mathews.com/ This test is not yet approved or cleared by the [place] FDA and  has been authorized for detection and/or diagnosis of SARS-CoV-2 by FDA under an Emergency Use Authorization (EUA). This EUA will remain  in effect (meaning this test can be used) for the duration of the COVID-19 declaration under Section 56 4(b)(1) of the Act, 21 U.S.C. section 360bbb-3(b)(1), unless the authorization is terminated or revoked sooner. Performed at [facility], [place] [address], [place], [place] [address]   Culture, blood (routine x 2)     Status: None (Preliminary result)   Collection Time: 08/22/19  2:00 AM   Specimen: BLOOD  Result Value Ref Range Status   Specimen Description BLOOD SITE NOT SPECIFIED  Final   Special Requests   Final    BOTTLES DRAWN AEROBIC AND ANAEROBIC Blood Culture adequate volume   Culture   Final    NO GROWTH  2 DAYS Performed at [facility], [address], [place], [place] [address]    Report Status PENDING  Incomplete  Culture, blood (routine x 2)     Status: None (Preliminary result)   Collection Time: 08/22/19  2:00 AM   Specimen: BLOOD RIGHT FOREARM  Result Value Ref Range Status   Specimen Description BLOOD RIGHT FOREARM  Final   Special Requests   Final    BOTTLES DRAWN AEROBIC AND ANAEROBIC Blood Culture adequate volume   Culture   Final    NO GROWTH 2 DAYS Performed at [facility], [place] [address] [place], [place] [address]    Report Status PENDING  Incomplete   Time spent: 30 min  SIGNED:   Marylu Lund, MD  [facility] 08/24/2019, 12:40 PM  If 7PM-7AM, please contact night-coverage

## 2019-08-24 NOTE — Progress Notes (Signed)
[place] Work  Clinical Social Work discussed patient's case/financial concerns with inpatient CSW. Patient recently informed [place] CSW that has insurance would end due to inability to work. CSW made referral to [facility] financial counselor to assist with Medicaid and SSDI referral while patient is hospitalized.  CSW notified patient of referral and he was appreciative to have assistance available to start application process.     Gwinda Maine, LCSW  Clinical Social Worker [facility]

## 2019-08-24 NOTE — Progress Notes (Addendum)
Patient discharged to home w/ family. Given all belongings, instructions, oxygen equipment. Verbalized understanding of instructions. Escorted to pov via w/c.

## 2019-08-24 NOTE — TOC Initial Note (Signed)
Transition of Care [facility]) - Initial/Assessment Note    Patient Details  Name: Jack Lester MRN: [id number] Date of Birth: 14-Apr-1961  [facility] CM/SW Contact:    Dessa Phi, RN Phone Number: 08/24/2019, 10:08 AM  Clinical Narrative: f/u on patient's insurance coverage-effective till 09/02/19-patient obligated to co pays. Does not qualify for med asst-he has health insurance. Per attending-plans to d/c today if medically stable. Assess if home 02 needed.See prior note from [place] to address financial counselor for medicaid. No further CM needs.                 Expected Discharge Plan: Home/Self Care Barriers to Discharge: Continued Medical Work up   Patient Goals and CMS Choice        Expected Discharge Plan and Services Expected Discharge Plan: Home/Self Care   Discharge Planning Services: CM Consult   Living arrangements for the past 2 months: Single Family Home                                      Prior Living Arrangements/Services Living arrangements for the past 2 months: Single Family Home Lives with:: Spouse   Do you feel safe going back to the place where you live?: Yes      Need for Family Participation in Patient Care: No (Comment) Care giver support system in place?: Yes (comment)   Criminal Activity/Legal Involvement Pertinent to Current Situation/Hospitalization: No - Comment as needed  Activities of Daily Living Home Assistive Devices/Equipment: None ADL Screening (condition at time of admission) Patient's cognitive ability adequate to safely complete daily activities?: Yes Is the patient deaf or have difficulty hearing?: No Does the patient have difficulty seeing, even when wearing glasses/contacts?: No Does the patient have difficulty concentrating, remembering, or making decisions?: No Patient able to express need for assistance with ADLs?: Yes Does the patient have difficulty dressing or bathing?: No Independently  performs ADLs?: Yes (appropriate for developmental age) Does the patient have difficulty walking or climbing stairs?: No Weakness of Legs: Both Weakness of Arms/Hands: None  Permission Sought/Granted Permission sought to share information with : Case Manager Permission granted to share information with : Yes, Verbal Permission Granted              Emotional Assessment Appearance:: Appears stated age Attitude/Demeanor/Rapport: Gracious Affect (typically observed): Accepting Orientation: : Oriented to Self, Oriented to Place, Oriented to  Time, Oriented to Situation Alcohol / Substance Use: Not Applicable Psych Involvement: No (comment)  Admission diagnosis:  Shortness of breath [R06.02] Hypoxia [R09.02] Acute respiratory failure with hypoxia ([place]) [J96.01] Patient Active Problem List   Diagnosis Date Noted  . Acute respiratory failure with hypoxia ([place]) 08/22/2019  . COPD (chronic obstructive pulmonary disease) ([place]) 08/22/2019  . HTN (hypertension) 08/22/2019  . Adenocarcinoma of left lung, stage 4 ([place]) 08/20/2019  . Brain metastases ([place]) 08/17/2019  . Cancer associated pain 08/02/2019  . 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 Bone metastasis ([place]) 07/15/2019  . Rectal adenocarcinoma ([place]) 07/15/2019   PCP:  Kerin Perna, NP Pharmacy:   [facility] [address], [place] [address] Phone: [phone] Fax: [place], [place] [address] Phone: [phone] Fax: [phone]  [facility]  [place], [place] [address] Phone: [phone] Fax: [phone]     Social Determinants of Health (SDOH) Interventions    Readmission Risk Interventions No flowsheet data found.

## 2019-08-24 NOTE — Progress Notes (Signed)
Oncology Nurse Navigator Documentation  Oncology Nurse Navigator Flowsheets 08/24/2019  Abnormal Finding Date 06/29/2019  Confirmed Diagnosis Date 08/16/2019  Diagnosis Status Pending Molecular Studies  Navigator Follow Up Date: 08/26/2019  Navigator Follow Up Reason: Radiology/I followed up on [name] one and PDL 1 status.  These test were not sent earlier as request.  I updated pathology dept and requested these test to be completed.   Navigator Location [facility]  Navigator Encounter Type Other:  Telephone -  Multidisiplinary Clinic Date -  Multidisiplinary Clinic Type -  Patient Visit Type Inpatient  Treatment Phase Pre-Tx/Tx Discussion  Barriers/Navigation Needs Coordination of Care  Education -  Interventions Coordination of Care  Acuity Level 2-Minimal Needs (1-2 Barriers Identified)  Coordination of Care Other  Education Method -  Time Spent with Patient 30

## 2019-08-24 NOTE — TOC Transition Note (Signed)
Transition of Care [facility]) - CM/SW Discharge Note   Patient Details  Name: Jack Lester MRN: [id number] Date of Birth: June 22, 1961  Transition [facility]) CM/SW Contact:  Dessa Phi, RN Phone Number: 08/24/2019, 12:31 PM   Clinical Narrative:Patient [facility] w[facility]ift [facility] from [facility]. Home 02/nebulizer machine ordered-rep Zach aware to deliver to rm prior discharge. No further CM needs.       Final next level of care: Home/Self Care Barriers to Discharge: No Barriers Identified   Patient Goals and CMS Choice        Discharge Placement                       Discharge Plan and Services   Discharge Planning Services: CM Consult            DME Arranged: Oxygen, Nebulizer machine DME Agency: [employer] Date DME Agency Contacted: 08/24/19 Time DME Agency Contacted: [phone] Representative spoke with at DME Agency: [email or web address]            Social Determinants of Health ([place]) Interventions     Readmission Risk Interventions No flowsheet data found.

## 2019-08-25 ENCOUNTER — Inpatient Hospital Stay (HOSPITAL_BASED_OUTPATIENT_CLINIC_OR_DEPARTMENT_OTHER): Payer: Managed Care, Other (non HMO) | Admitting: Physician Assistant

## 2019-08-25 ENCOUNTER — Ambulatory Visit (HOSPITAL_COMMUNITY)
Admission: RE | Admit: 2019-08-25 | Discharge: 2019-08-25 | Disposition: A | Discharge status: Home / Self Care | Payer: Managed Care, Other (non HMO) | Source: Ambulatory Visit | Attending: Radiation Oncology | Admitting: Radiation Oncology

## 2019-08-25 ENCOUNTER — Other Ambulatory Visit: Payer: Self-pay

## 2019-08-25 ENCOUNTER — Other Ambulatory Visit: Payer: Self-pay | Admitting: Internal Medicine

## 2019-08-25 ENCOUNTER — Emergency Department (HOSPITAL_COMMUNITY): Payer: Managed Care, Other (non HMO)

## 2019-08-25 ENCOUNTER — Inpatient Hospital Stay: Payer: Managed Care, Other (non HMO)

## 2019-08-25 ENCOUNTER — Encounter: Payer: Self-pay | Admitting: Physician Assistant

## 2019-08-25 ENCOUNTER — Emergency Department (HOSPITAL_COMMUNITY)
Admission: EM | Admit: 2019-08-25 | Discharge: 2019-08-26 | Disposition: A | Discharge status: Home / Self Care | Payer: Managed Care, Other (non HMO) | Attending: Emergency Medicine | Admitting: Emergency Medicine

## 2019-08-25 ENCOUNTER — Encounter (HOSPITAL_COMMUNITY): Payer: Self-pay | Admitting: Emergency Medicine

## 2019-08-25 VITALS — BP 153/98 | HR 73 | Temp 98.7°F | Resp 17 | Ht 69.0 in | Wt 114.0 lb

## 2019-08-25 DIAGNOSIS — C3492 Malignant neoplasm of unspecified part of left bronchus or lung: Secondary | ICD-10-CM | POA: Diagnosis not present

## 2019-08-25 DIAGNOSIS — C7931 Secondary malignant neoplasm of brain: Secondary | ICD-10-CM | POA: Diagnosis not present

## 2019-08-25 DIAGNOSIS — C7949 Secondary malignant neoplasm of other parts of nervous system: Secondary | ICD-10-CM

## 2019-08-25 DIAGNOSIS — Z85118 Personal history of other malignant neoplasm of bronchus and lung: Secondary | ICD-10-CM | POA: Diagnosis not present

## 2019-08-25 DIAGNOSIS — C20 Malignant neoplasm of rectum: Secondary | ICD-10-CM | POA: Diagnosis not present

## 2019-08-25 DIAGNOSIS — R079 Chest pain, unspecified: Secondary | ICD-10-CM | POA: Insufficient documentation

## 2019-08-25 DIAGNOSIS — R42 Dizziness and giddiness: Secondary | ICD-10-CM | POA: Insufficient documentation

## 2019-08-25 DIAGNOSIS — Z85048 Personal history of other malignant neoplasm of rectum, rectosigmoid junction, and anus: Secondary | ICD-10-CM | POA: Insufficient documentation

## 2019-08-25 DIAGNOSIS — Z79899 Other long term (current) drug therapy: Secondary | ICD-10-CM | POA: Diagnosis not present

## 2019-08-25 DIAGNOSIS — Z7189 Other specified counseling: Secondary | ICD-10-CM

## 2019-08-25 DIAGNOSIS — J449 Chronic obstructive pulmonary disease, unspecified: Secondary | ICD-10-CM | POA: Diagnosis not present

## 2019-08-25 DIAGNOSIS — I10 Essential (primary) hypertension: Secondary | ICD-10-CM | POA: Diagnosis not present

## 2019-08-25 DIAGNOSIS — G893 Neoplasm related pain (acute) (chronic): Secondary | ICD-10-CM | POA: Diagnosis not present

## 2019-08-25 DIAGNOSIS — R0602 Shortness of breath: Secondary | ICD-10-CM | POA: Diagnosis present

## 2019-08-25 DIAGNOSIS — F17210 Nicotine dependence, cigarettes, uncomplicated: Secondary | ICD-10-CM | POA: Diagnosis not present

## 2019-08-25 DIAGNOSIS — Z20828 Contact with and (suspected) exposure to other viral communicable diseases: Secondary | ICD-10-CM | POA: Diagnosis not present

## 2019-08-25 DIAGNOSIS — Z5111 Encounter for antineoplastic chemotherapy: Secondary | ICD-10-CM | POA: Insufficient documentation

## 2019-08-25 DIAGNOSIS — Z5112 Encounter for antineoplastic immunotherapy: Secondary | ICD-10-CM | POA: Insufficient documentation

## 2019-08-25 LAB — CBC WITH DIFFERENTIAL (CANCER CENTER ONLY)
Abs Immature Granulocytes: 0.4 10*3/uL — ABNORMAL HIGH (ref 0.00–0.07)
Basophils Absolute: 0.1 10*3/uL (ref 0.0–0.1)
Basophils Relative: 0 %
Eosinophils Absolute: 0.8 10*3/uL — ABNORMAL HIGH (ref 0.0–0.5)
Eosinophils Relative: 2 %
HCT: 40.5 % (ref 39.0–52.0)
Hemoglobin: 13.4 g/dL (ref 13.0–17.0)
Immature Granulocytes: 1 %
Lymphocytes Relative: 7 %
Lymphs Abs: 2.4 10*3/uL (ref 0.7–4.0)
MCH: 33.8 pg (ref 26.0–34.0)
MCHC: 33.1 g/dL (ref 30.0–36.0)
MCV: 102.3 fL — ABNORMAL HIGH (ref 80.0–100.0)
Monocytes Absolute: 2.6 10*3/uL — ABNORMAL HIGH (ref 0.1–1.0)
Monocytes Relative: 7 %
Neutro Abs: 29.2 10*3/uL — ABNORMAL HIGH (ref 1.7–7.7)
Neutrophils Relative %: 83 %
Platelet Count: 450 10*3/uL — ABNORMAL HIGH (ref 150–400)
RBC: 3.96 MIL/uL — ABNORMAL LOW (ref 4.22–5.81)
RDW: 14.6 % (ref 11.5–15.5)
WBC Count: 35.4 10*3/uL — ABNORMAL HIGH (ref 4.0–10.5)
nRBC: 0 % (ref 0.0–0.2)

## 2019-08-25 LAB — CBC WITH DIFFERENTIAL/PLATELET
Abs Immature Granulocytes: 0 10*3/uL (ref 0.00–0.07)
Basophils Absolute: 0 10*3/uL (ref 0.0–0.1)
Basophils Relative: 0 %
Eosinophils Absolute: 0.7 10*3/uL — ABNORMAL HIGH (ref 0.0–0.5)
Eosinophils Relative: 2 %
HCT: 40.8 % (ref 39.0–52.0)
Hemoglobin: 13.7 g/dL (ref 13.0–17.0)
Lymphocytes Relative: 9 %
Lymphs Abs: 3.1 10*3/uL (ref 0.7–4.0)
MCH: 34.1 pg — ABNORMAL HIGH (ref 26.0–34.0)
MCHC: 33.6 g/dL (ref 30.0–36.0)
MCV: 101.5 fL — ABNORMAL HIGH (ref 80.0–100.0)
Monocytes Absolute: 2.7 10*3/uL — ABNORMAL HIGH (ref 0.1–1.0)
Monocytes Relative: 8 %
Neutro Abs: 27.5 10*3/uL — ABNORMAL HIGH (ref 1.7–7.7)
Neutrophils Relative %: 81 %
Platelets: 427 10*3/uL — ABNORMAL HIGH (ref 150–400)
RBC: 4.02 MIL/uL — ABNORMAL LOW (ref 4.22–5.81)
RDW: 14.5 % (ref 11.5–15.5)
WBC: 33.9 10*3/uL — ABNORMAL HIGH (ref 4.0–10.5)
nRBC: 0 % (ref 0.0–0.2)
nRBC: 0 /100 WBC

## 2019-08-25 LAB — COMPREHENSIVE METABOLIC PANEL
ALT: 28 U/L (ref 0–44)
AST: 28 U/L (ref 15–41)
Albumin: 3.3 g/dL — ABNORMAL LOW (ref 3.5–5.0)
Alkaline Phosphatase: 87 U/L (ref 38–126)
Anion gap: 10 (ref 5–15)
BUN: 29 mg/dL — ABNORMAL HIGH (ref 6–20)
CO2: 25 mmol/L (ref 22–32)
Calcium: 9.3 mg/dL (ref 8.9–10.3)
Chloride: 99 mmol/L (ref 98–111)
Creatinine, Ser: 0.84 mg/dL (ref 0.61–1.24)
GFR calc Af Amer: >60 mL/min (ref >60)
GFR calc non Af Amer: >60 mL/min (ref >60)
Glucose, Bld: 97 mg/dL (ref 70–99)
Potassium: 4.8 mmol/L (ref 3.5–5.1)
Sodium: 134 mmol/L — ABNORMAL LOW (ref 135–145)
Total Bilirubin: 0.6 mg/dL (ref 0.3–1.2)
Total Protein: 7.6 g/dL (ref 6.5–8.1)

## 2019-08-25 LAB — CMP (CANCER CENTER ONLY)
ALT: 23 U/L (ref 0–44)
AST: 21 U/L (ref 15–41)
Albumin: 3.4 g/dL — ABNORMAL LOW (ref 3.5–5.0)
Alkaline Phosphatase: 96 U/L (ref 38–126)
Anion gap: 11 (ref 5–15)
BUN: 29 mg/dL — ABNORMAL HIGH (ref 6–20)
CO2: 28 mmol/L (ref 22–32)
Calcium: 9.4 mg/dL (ref 8.9–10.3)
Chloride: 98 mmol/L (ref 98–111)
Creatinine: 0.87 mg/dL (ref 0.61–1.24)
GFR, Est AFR Am: >60 mL/min (ref >60)
GFR, Estimated: >60 mL/min (ref >60)
Glucose, Bld: 92 mg/dL (ref 70–99)
Potassium: 4.9 mmol/L (ref 3.5–5.1)
Sodium: 137 mmol/L (ref 135–145)
Total Bilirubin: <0.2 mg/dL — ABNORMAL LOW (ref 0.3–1.2)
Total Protein: 7.9 g/dL (ref 6.5–8.1)

## 2019-08-25 LAB — TROPONIN I (HIGH SENSITIVITY)
Troponin I (High Sensitivity): 14 ng/L (ref <18)
Troponin I (High Sensitivity): 9 ng/L (ref <18)

## 2019-08-25 LAB — POC SARS CORONAVIRUS 2 AG -  ED: SARS Coronavirus 2 Ag: NEGATIVE

## 2019-08-25 LAB — TSH: TSH: 1.395 u[IU]/mL (ref 0.320–4.118)

## 2019-08-25 MED ORDER — IOHEXOL 350 MG/ML SOLN
80.0000 mL | Freq: Once | INTRAVENOUS | Status: AC | PRN
  Administered 2019-08-25: 80 mL via INTRAVENOUS

## 2019-08-25 MED ORDER — ALBUTEROL SULFATE HFA 108 (90 BASE) MCG/ACT IN AERS
8.0000 | INHALATION_SPRAY | Freq: Once | RESPIRATORY_TRACT | Status: AC
  Administered 2019-08-25: 8 via RESPIRATORY_TRACT
  Filled 2019-08-25: qty 6.7

## 2019-08-25 MED ORDER — SODIUM CHLORIDE 0.9% FLUSH
3.0000 mL | Freq: Once | INTRAVENOUS | Status: AC
  Administered 2019-08-25: 3 mL via INTRAVENOUS

## 2019-08-25 MED ORDER — IPRATROPIUM BROMIDE 0.02 % IN SOLN
0.5000 mg | Freq: Once | RESPIRATORY_TRACT | Status: AC
  Administered 2019-08-26: 0.5 mg via RESPIRATORY_TRACT
  Filled 2019-08-25: qty 2.5

## 2019-08-25 MED ORDER — OXYCODONE-ACETAMINOPHEN 5-325 MG PO TABS: 1.0000 | ORAL_TABLET | Freq: Three times a day (TID) | ORAL | 0 refills | Status: DC | PRN

## 2019-08-25 MED ORDER — CYANOCOBALAMIN 1000 MCG/ML IJ SOLN
INTRAMUSCULAR | Status: AC
  Filled 2019-08-25: qty 1

## 2019-08-25 MED ORDER — CYANOCOBALAMIN 1000 MCG/ML IJ SOLN
1000.0000 ug | Freq: Once | INTRAMUSCULAR | Status: AC
  Administered 2019-08-25: 1000 ug via INTRAMUSCULAR

## 2019-08-25 MED ORDER — PROCHLORPERAZINE MALEATE 10 MG PO TABS: 10.0000 mg | ORAL_TABLET | Freq: Four times a day (QID) | ORAL | 2 refills | Status: DC | PRN

## 2019-08-25 MED ORDER — OXYCODONE-ACETAMINOPHEN 5-325 MG PO TABS
1.0000 | ORAL_TABLET | Freq: Three times a day (TID) | ORAL | Status: DC | PRN
  Administered 2019-08-25: 1 via ORAL
  Filled 2019-08-25: qty 1

## 2019-08-25 MED ORDER — ALBUTEROL SULFATE (2.5 MG/3ML) 0.083% IN NEBU
5.0000 mg | INHALATION_SOLUTION | Freq: Once | RESPIRATORY_TRACT | Status: AC
  Administered 2019-08-26: 5 mg via RESPIRATORY_TRACT
  Filled 2019-08-25: qty 6

## 2019-08-25 MED ORDER — FOLIC ACID 1 MG PO TABS: 1.0000 mg | ORAL_TABLET | Freq: Every day | ORAL | 2 refills | Status: DC

## 2019-08-25 NOTE — ED Notes (Signed)
Pt refusing repeat EKG. Already had one at his MD today and labs drawn

## 2019-08-25 NOTE — Progress Notes (Signed)
  Radiation Oncology         [phone] ________________________________  Name: Jack Lester MRN: [id number]  Date: 08/26/2019  DOB: 1961-03-29  SIMULATION AND TREATMENT PLANNING NOTE    ICD-10-CM   1. Brain metastases ([place])  C79.31     DIAGNOSIS:  58 yo man with a solitary 6 mm left frontal lobe metastasis  NARRATIVE:  The patient was brought to the [facility].  Identity was confirmed.  All relevant records and images related to the planned course of therapy were reviewed.  The patient freely provided informed written consent to proceed with treatment after reviewing the details related to the planned course of therapy. The consent form was witnessed and verified by the simulation staff. Intravenous access was established for contrast administration. Then, the patient was set-up in a stable reproducible supine position for radiation therapy.  A relocatable thermoplastic stereotactic head frame was fabricated for precise immobilization.  CT images were obtained.  Surface markings were placed.  The CT images were loaded into the planning software and fused with the patient's targeting MRI scan.  Then the target and avoidance structures were contoured.  Treatment planning then occurred.  The radiation prescription was entered and confirmed.  I have requested 3D planning  I have requested a DVH of the following structures: Brain stem, brain, left eye, right eye, lenses, optic chiasm, target volumes, uninvolved brain, and normal tissue.    SPECIAL TREATMENT PROCEDURE:  The planned course of therapy using radiation constitutes a special treatment procedure. Special care is required in the management of this patient for the following reasons. This treatment constitutes a Special Treatment Procedure for the following reason: High dose per fraction requiring special monitoring for increased toxicities of treatment including daily imaging.  The special nature of the planned course of  radiotherapy will require increased physician supervision and oversight to ensure patient's safety with optimal treatment outcomes.  PLAN:  The patient will receive 20 Gy in 1 fraction.  ________________________________  Sheral Apley Tammi Klippel, M.D.

## 2019-08-25 NOTE — Patient Instructions (Signed)
Summary:  -There are two main categories of lung cancer, they are named based on the size of the cancer cell. One is called Non-Small cell lung cancer. The other type is Small Cell Lung Cancer -The sample (biopsy) that they took of your tumor was consistent with a subtype of Non-small cell lung cancer called Adenocarcinoma. This is the most common type of lung cancer.  -We covered a lot of important information at your appointment today regarding what the treatment plan is moving forward. Here are the the main points that were discussed at your office visit with [place] today:  -The treatment that you will receive consists of two chemotherapy drugs, called Carboplatin and Alimta (also called Pemetrexed) and one immunotherapy drug called Keytruda (pembrolizumab).  -We are planning on starting your treatment next week on 09/06/2018 but before your start your treatment, I would like you to attend a Chemotherapy Education Class. This involves having you sit down with one of our nurse educators. She will discuss with your one-on-one more details about your treatment as well as general information about resources here at the cancer center.  -Your treatment will be given once every 3 weeks. We will check your labs once a week for the first ~5 treatments just to make sure that important components of your blood are in an acceptable range -We will get a CT scan after 3 treatments to check on the progress of treatment  Medications:  -I have sent a few important medication prescriptions to your pharmacy.  -Compazine was sent to your pharmacy. This medication is for nausea. You may take this every 6 hours as needed if you feel nauseous.  -I have also sent a prescription for 1 mg of folic acid to your pharmacy. We need you to take 1 tablet every day.  -We will administer vitamin B12 every 9 weeks while you are here in the clinic. You have received your first dose today.   Referrals or Imaging: -Complete radiation to the  small spot in the brain as scheduled -We will send a special genetic test to see if you are a candidate for one of the chemotherapy drugs to take by mouth   Follow up:  -We will see you back for a follow up visit 2 week before your first treatment just to go over the results of the genetic test to see if you will proceed with chemo that is given IV or the drug you take by mouth.  -If you need to reach [place] at any time, the main office number to the cancer center is [phone], when you call, ask to speak to either Cassie's or Dr. Worthy Flank nurse.   Pemetrexed injection What is this medicine? PEMETREXED (PEM e TREX ed) is a chemotherapy drug used to treat lung cancers like non-small cell lung cancer and mesothelioma. It may also be used to treat other cancers. This medicine may be used for other purposes; ask your health care provider or pharmacist if you have questions. COMMON BRAND NAME(S): Alimta What should I tell my health care provider before I take this medicine? They need to know if you have any of these conditions:  infection (especially a virus infection such as chickenpox, cold sores, or herpes)  kidney disease  low blood counts, like low white cell, platelet, or red cell counts  lung or breathing disease, like asthma  radiation therapy  an unusual or allergic reaction to pemetrexed, other medicines, foods, dyes, or preservative  pregnant or trying to get  pregnant  breast-feeding How should I use this medicine? This drug is given as an infusion into a vein. It is administered in a hospital or clinic by a specially trained health care professional. Talk to your pediatrician regarding the use of this medicine in children. Special care may be needed. Overdosage: If you think you have taken too much of this medicine contact a poison control center or emergency room at once. NOTE: This medicine is only for you. Do not share this medicine with others. What if I miss a  dose? It is important not to miss your dose. Call your doctor or health care professional if you are unable to keep an appointment. What may interact with this medicine? This medicine may interact with the following medications:  Ibuprofen This list may not describe all possible interactions. Give your health care provider a list of all the medicines, herbs, non-prescription drugs, or dietary supplements you use. Also tell them if you smoke, drink alcohol, or use illegal drugs. Some items may interact with your medicine. What should I watch for while using this medicine? Visit your doctor for checks on your progress. This drug may make you feel generally unwell. This is not uncommon, as chemotherapy can affect healthy cells as well as cancer cells. Report any side effects. Continue your course of treatment even though you feel ill unless your doctor tells you to stop. In some cases, you may be given additional medicines to help with side effects. Follow all directions for their use. Call your doctor or health care professional for advice if you get a fever, chills or sore throat, or other symptoms of a cold or flu. Do not treat yourself. This drug decreases your body's ability to fight infections. Try to avoid being around people who are sick. This medicine may increase your risk to bruise or bleed. Call your doctor or health care professional if you notice any unusual bleeding. Be careful brushing and flossing your teeth or using a toothpick because you may get an infection or bleed more easily. If you have any dental work done, tell your dentist you are receiving this medicine. Avoid taking products that contain aspirin, acetaminophen, ibuprofen, naproxen, or ketoprofen unless instructed by your doctor. These medicines may hide a fever. Call your doctor or health care professional if you get diarrhea or mouth sores. Do not treat yourself. To protect your kidneys, drink water or other fluids as  directed while you are taking this medicine. Do not become pregnant while taking this medicine or for 6 months after stopping it. Women should inform their doctor if they wish to become pregnant or think they might be pregnant. Men should not father a child while taking this medicine and for 3 months after stopping it. This may interfere with the ability to father a child. You should talk to your doctor or health care professional if you are concerned about your fertility. There is a potential for serious side effects to an unborn child. Talk to your health care professional or pharmacist for more information. Do not breast-feed an infant while taking this medicine or for 1 week after stopping it. What side effects may I notice from receiving this medicine? Side effects that you should report to your doctor or health care professional as soon as possible:  allergic reactions like skin rash, itching or hives, swelling of the face, lips, or tongue  breathing problems  redness, blistering, peeling or loosening of the skin, including inside the mouth  signs and symptoms of bleeding such as bloody or black, tarry stools; red or dark-brown urine; spitting up blood or brown material that looks like coffee grounds; red spots on the skin; unusual bruising or bleeding from the eye, gums, or nose  signs and symptoms of infection like fever or chills; cough; sore throat; pain or trouble passing urine  signs and symptoms of kidney injury like trouble passing urine or change in the amount of urine  signs and symptoms of liver injury like dark yellow or brown urine; general ill feeling or flu-like symptoms; light-colored stools; loss of appetite; nausea; right upper belly pain; unusually weak or tired; yellowing of the eyes or skin Side effects that usually do not require medical attention (report to your doctor or health care professional if they continue or are bothersome):  constipation  mouth  sores  nausea, vomiting  unusually weak or tired This list may not describe all possible side effects. Call your doctor for medical advice about side effects. You may report side effects to FDA at 1-800-FDA-1088. Where should I keep my medicine? This drug is given in a hospital or clinic and will not be stored at home. NOTE: This sheet is a summary. It may not cover all possible information. If you have questions about this medicine, talk to your doctor, pharmacist, or health care provider.  2020 Elsevier/Gold Standard (2017-10-08 16:11:33) Carboplatin injection What is this medicine? CARBOPLATIN (KAR boe pla tin) is a chemotherapy drug. It targets fast dividing cells, like cancer cells, and causes these cells to die. This medicine is used to treat ovarian cancer and many other cancers. This medicine may be used for other purposes; ask your health care provider or pharmacist if you have questions. COMMON BRAND NAME(S): Paraplatin What should I tell my health care provider before I take this medicine? They need to know if you have any of these conditions:  blood disorders  hearing problems  kidney disease  recent or ongoing radiation therapy  an unusual or allergic reaction to carboplatin, cisplatin, other chemotherapy, other medicines, foods, dyes, or preservatives  pregnant or trying to get pregnant  breast-feeding How should I use this medicine? This drug is usually given as an infusion into a vein. It is administered in a hospital or clinic by a specially trained health care professional. Talk to your pediatrician regarding the use of this medicine in children. Special care may be needed. Overdosage: If you think you have taken too much of this medicine contact a poison control center or emergency room at once. NOTE: This medicine is only for you. Do not share this medicine with others. What if I miss a dose? It is important not to miss a dose. Call your doctor or health care  professional if you are unable to keep an appointment. What may interact with this medicine?  medicines for seizures  medicines to increase blood counts like filgrastim, pegfilgrastim, sargramostim  some antibiotics like amikacin, gentamicin, neomycin, streptomycin, tobramycin  vaccines Talk to your doctor or health care professional before taking any of these medicines:  acetaminophen  aspirin  ibuprofen  ketoprofen  naproxen This list may not describe all possible interactions. Give your health care provider a list of all the medicines, herbs, non-prescription drugs, or dietary supplements you use. Also tell them if you smoke, drink alcohol, or use illegal drugs. Some items may interact with your medicine. What should I watch for while using this medicine? Your condition will be monitored carefully while you  are receiving this medicine. You will need important blood work done while you are taking this medicine. This drug may make you feel generally unwell. This is not uncommon, as chemotherapy can affect healthy cells as well as cancer cells. Report any side effects. Continue your course of treatment even though you feel ill unless your doctor tells you to stop. In some cases, you may be given additional medicines to help with side effects. Follow all directions for their use. Call your doctor or health care professional for advice if you get a fever, chills or sore throat, or other symptoms of a cold or flu. Do not treat yourself. This drug decreases your body's ability to fight infections. Try to avoid being around people who are sick. This medicine may increase your risk to bruise or bleed. Call your doctor or health care professional if you notice any unusual bleeding. Be careful brushing and flossing your teeth or using a toothpick because you may get an infection or bleed more easily. If you have any dental work done, tell your dentist you are receiving this medicine. Avoid  taking products that contain aspirin, acetaminophen, ibuprofen, naproxen, or ketoprofen unless instructed by your doctor. These medicines may hide a fever. Do not become pregnant while taking this medicine. Women should inform their doctor if they wish to become pregnant or think they might be pregnant. There is a potential for serious side effects to an unborn child. Talk to your health care professional or pharmacist for more information. Do not breast-feed an infant while taking this medicine. What side effects may I notice from receiving this medicine? Side effects that you should report to your doctor or health care professional as soon as possible:  allergic reactions like skin rash, itching or hives, swelling of the face, lips, or tongue  signs of infection - fever or chills, cough, sore throat, pain or difficulty passing urine  signs of decreased platelets or bleeding - bruising, pinpoint red spots on the skin, black, tarry stools, nosebleeds  signs of decreased red blood cells - unusually weak or tired, fainting spells, lightheadedness  breathing problems  changes in hearing  changes in vision  chest pain  high blood pressure  low blood counts - This drug may decrease the number of white blood cells, red blood cells and platelets. You may be at increased risk for infections and bleeding.  nausea and vomiting  pain, swelling, redness or irritation at the injection site  pain, tingling, numbness in the hands or feet  problems with balance, talking, walking  trouble passing urine or change in the amount of urine Side effects that usually do not require medical attention (report to your doctor or health care professional if they continue or are bothersome):  hair loss  loss of appetite  metallic taste in the mouth or changes in taste This list may not describe all possible side effects. Call your doctor for medical advice about side effects. You may report side effects  to FDA at 1-800-FDA-1088. Where should I keep my medicine? This drug is given in a hospital or clinic and will not be stored at home. NOTE: This sheet is a summary. It may not cover all possible information. If you have questions about this medicine, talk to your doctor, pharmacist, or health care provider.  2020 Elsevier/Gold Standard (2007-11-24 14:38:05)

## 2019-08-25 NOTE — ED Notes (Signed)
Fiance- angela [phone] would like an update

## 2019-08-25 NOTE — Progress Notes (Signed)
Pt came to radiology and said he was having trouble breathing, he had O2 on and when asked if he could lay on his back he said no, he couldn't breathe.  He said he had a breathing treatment that didn't help.  Asked pt if he wanted to go to the ER to have a breathing treatment and then perhaps perform the scan later and he agreed.  Took pt to the ER but pt was upset that he was going to have to wait until they assessed him.  Pt was left with the ER staff to try to get his breathing treatment done.

## 2019-08-25 NOTE — Progress Notes (Signed)
  Radiation Oncology         [phone] ________________________________  Name: Jack Lester MRN: [id number]  Date: 08/26/2019  DOB: 1961-02-15  SIMULATION AND TREATMENT PLANNING NOTE    ICD-10-CM   1. 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 Brain metastases ([facility])  C79.31   3. Bone metastasis ([facility])  C79.51   4. Adenocarcinoma of left lung, stage 4 ([facility])  C34.92     DIAGNOSIS:  58 yo man with symptomatic metastases and primary tumor to T1/first rib and L4/Rectum from primary rectal and lung cancer, stage IV  NARRATIVE:  The patient was brought to the [facility].  Identity was confirmed.  All relevant records and images related to the planned course of therapy were reviewed.  The patient freely provided informed written consent to proceed with treatment after reviewing the details related to the planned course of therapy. The consent form was witnessed and verified by the simulation staff.  Then, the patient was set-up in a stable reproducible  supine position for radiation therapy.  CT images were obtained.  Surface markings were placed.  The CT images were loaded into the planning software.  Then the target and avoidance structures were contoured.  Treatment planning then occurred.  The radiation prescription was entered and confirmed.  Then, I designed and supervised the construction of a total of 9 medically necessary complex treatment devices with VacLoc leg positioner, and 8 MLCs to spare the lungs, spinal cord, kidneys and bowel.  I have requested : 3D Simulation  I have requested a DVH of the following structures: left kidney, right kidney, and targets.  PLAN:  The patient will receive 30 Gy in 10 fractions.  ________________________________  Sheral Apley Tammi Klippel, M.D.

## 2019-08-25 NOTE — Progress Notes (Signed)
START OFF PATHWAY REGIMEN - Non-Small Cell Lung   [id number]:Pembrolizumab 200 mg IV D1 + Pemetrexed 500 mg/m2 IV D1 q21 Days (Maintenance):   A cycle is every 21 days:     Pembrolizumab      Pemetrexed   **Always confirm dose/schedule in your pharmacy ordering system**  Patient Characteristics: Stage IV Metastatic, Nonsquamous, Initial Chemotherapy/Immunotherapy, PS = 0, 1, ALK or EGFR or ROS1 or NTRK or MET or RET Genomic Alterations - Awaiting Test Results AJCC T Category: T3 Current Disease Status: Distant Metastases AJCC N Category: N2 AJCC M Category: M1c AJCC 8 Stage Grouping: IVB Histology: Nonsquamous Cell ROS1 Rearrangement Status: Awaiting Test Results T790M Mutation Status: Not Applicable - EGFR Mutation Negative/Unknown Other Mutations/Biomarkers: No Other Actionable Mutations Chemotherapy/Immunotherapy LOT: Initial Chemotherapy/Immunotherapy Molecular Targeted Therapy: Not Appropriate MET Exon 14 Mutation Status: Awaiting Test Results RET Gene Fusion Status: Awaiting Test Results EGFR Mutation Status: Awaiting Test Results NTRK Gene Fusion Status: Awaiting Test Results PD-L1 Expression Status: Awaiting Test Results ALK Rearrangement Status: Awaiting Test Results BRAF V600E Mutation Status: Awaiting Test Results ECOG Performance Status: 1 Biomarker Assessment Status Confirmation: Awaiting genomic biomarker test(s) results and need to start chemotherapy Intent of Therapy: Non-Curative / Palliative Intent, Discussed with Patient
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
## 2019-08-25 NOTE — ED Triage Notes (Signed)
Pt states he came for an MRI today to evaluate his cancer. Pt was sent here due to being so SOB and having CP. These symptoms started this morning. Pt has hx of lung cancer and is on 2L [place]. Denies N/V

## 2019-08-25 NOTE — ED Provider Notes (Signed)
Care assumed from [facility], [place].  Please see his full H&P.  In short,  Jack Lester is a 58 y.o. male presents for chest pain and shortness of breath.  He has a history of rectal adenocarcinoma with widespread metastasis including to his lung.  He is currently on chemotherapy.  Patient discharged from the hospital yesterday for which he was admitted for shortness of breath.  He was discharged home on 2 L of supplemental oxygen via nasal cannula.  Today he has had worsening shortness of breath.  Initial provider gave patient albuterol without improvement and reports patient is on 10 L of supplemental oxygen via nasal cannula to maintain oxygen saturations.    Records reviewed.  Patient with CT angio chest on 08/21/2019 which was negative for pulmonary embolism however given his high risk, mild tachycardia and hypoxia will repeat today.  Patient reports to me that he was prescribed albuterol nebulizer but did not know how to mix this at home and therefore did not attempt to utilize this yesterday evening or today as the shortness of breath worsened.  He reports he is now clear on how to utilize this medication at home.  Physical Exam  BP 119/86   Pulse 97   Temp 97.8 F (36.6 C) (Oral)   Resp 16   SpO2 98%   Physical Exam Vitals and nursing note reviewed.  Constitutional:      General: He is not in acute distress.    Appearance: He is well-developed. He is cachectic.     Comments: Chronically ill-appearing  HENT:     Head: Normocephalic.  Eyes:     General: No scleral icterus.    Conjunctiva/sclera: Conjunctivae normal.  Cardiovascular:     Rate and Rhythm: Normal rate.  Pulmonary:     Effort: Pulmonary effort is normal.     Breath sounds: Decreased breath sounds and wheezing (inspiratory and expiratory in all fields) present.     Comments: Pt on 4L via [place] with SPO2 98% Musculoskeletal:        General: Normal range of motion.     Cervical back: Normal range of motion.   Skin:    General: Skin is warm and dry.  Neurological:     Mental Status: He is alert.     ED Course/Procedures   Clinical Course as of Aug 25 126  Wed Aug 25, 2019  2200 Plan: Patient pending CT angiogram.   [HM]  2221 Pt complaining of chest/lung and back pain. He takes oxycodone at home.  His last dose was 8am.  Will order home oxycodone.   [HM]  2315 Leukocytosis is baseline  WBC(!): 33.9 [HM]  2339 CT angiogram shows extensive hilar adenopathy and worsening occlusion of the bronchus intermedius and posterior right lower lobe bronchi due to mucous plugging but metastatic changes appear to be unchanged.  There are no findings of central, segmental or subsegmental pulmonary embolism.    [HM]  2343 On my assessment the patient is on 4 L via nasal cannula.  He is maintaining his oxygen saturations with this level.  He is wheezing throughout.  His Covid test was negative therefore we will give albuterol nebulizer, reassess and ambulate.   [HM]  Thu Aug 26, 2019  0119 Pt ambulates with some SOB but maintains SPO2 >90% while wearing oxygen at 2l via [place]   [HM]  0119 Pt reports he is feeling much better.  He reports his CP and SOB have improved to the levels  he had at discharge yesterday.  He is comfortable with d/c home and reports several f/u appointments this morning.   [HM]    Clinical Course User Index [HM] Agapito Games    EKG Interpretation  Date/Time:  Wednesday August 25 2019 20:15:05 EST Ventricular Rate:  94 PR Interval:    QRS Duration: 88 QT Interval:  331 QTC Calculation: 414 R Axis:   29 Text Interpretation: Sinus rhythm Short PR interval LAE, consider biatrial enlargement Abnormal R-wave progression, early transition very peaked t waves, no sig change from previous Confirmed by Charlesetta Shanks [phone]) on 08/25/2019 8:20:11 PM       CT Angio Chest PE W and/or Wo Contrast  Result Date: 08/25/2019 CLINICAL DATA:  Chest pain and shortness of  breath EXAM: CT ANGIOGRAPHY CHEST WITH CONTRAST TECHNIQUE: Multidetector CT imaging of the chest was performed using the standard protocol during bolus administration of intravenous contrast. Multiplanar CT image reconstructions and MIPs were obtained to evaluate the vascular anatomy. CONTRAST:  1mL OMNIPAQUE IOHEXOL 350 MG/ML SOLN COMPARISON:  August 21, 2019 CT FINDINGS: Cardiovascular: There is a optimal opacification of the pulmonary arteries. There is no central,segmental, or subsegmental filling defects within the pulmonary arteries. Again noted is right upper lobe pulmonary arterial compression due to right hilar nodal disease. This does not appear to be significantly changed since the prior exam. The heart is normal in size. No pericardial effusion or thickening. No evidence right heart strain. There is patent four-vessel arch with the left vertebral artery arising from the aortic arch. The thoracic aorta is normal in appearance. Mediastinum/Nodes: Again noted is extensive right hilar adenopathy which appears to extend into the pretracheal and subcarinal regions. Within the pretracheal region the lymph node measures approximately 1.6 cm in AP dimension and is unchanged from the prior exam. There is mass effect and occlusion seen on the right upper lobe bronchus with mucous plugging. The esophagus and thyroid are unremarkable. Lungs/Pleura: There is diffuse subpleural nodularity and thickening seen throughout the right lung. Extensive centrilobular emphysematous changes are seen within both lung apices. There are multiple small subcentimeter pulmonary nodules seen throughout the right lung which are unchanged from the prior exam. There is inter septal thickening and tree-in-bud opacity seen within the right middle lobe and right upper lobe. Thickening is again noted along the minor and major fissures. Again noted is occlusion of the anterior right upper lobe bronchus. There is new/worsening occlusion of  the bronchus intermedius and posterior right lower lobe bronchi due to mucous plugging. There is subtle subpleural nodularity seen throughout the left lung with tiny small sub 5 mm pulmonary nodules throughout. Upper Abdomen: No acute abnormalities present in the visualized portions of the upper abdomen. Arterial atherosclerosis is noted. Musculoskeletal: There is an unchanged lytic lesion in the posterior right first rib, medial left clavicle, T11 vertebral body. There is also a this lytic lesion seen in the right T3 vertebral body/pedicle. Review of the MIP images confirms the above findings. IMPRESSION: 1. No central, segmental, or subsegmental pulmonary embolism. 2. Unchanged findings of right-sided pulmonary neoplasm with diffuse subpleural nodularity,multiple subcentimeter pulmonary nodules, and lymphangitic carcinomatosis. 3. Extensive right hilar adenopathy extending into the mediastinum causing narrowing of the right upper lobe pulmonary artery subsegmental branches. 4. Stable left lung subpleural nodularity and sub 5 mm pulmonary nodules, likely metastatic disease. 5. There is new/worsening occlusion of the bronchus intermedius and posterior right lower lobe bronchi due to mucous plugging. 6. Unchanged osseous metastatic disease in the  right first rib, T11, T3, and medial left clavicle. Electronically Signed   By: Prudencio Pair M.D.   On: 08/25/2019 23:18   DG Chest Portable 1 View  Result Date: 08/25/2019 CLINICAL DATA:  Presented for MRI, referred to emergency department due to shortness of breath and chest pain EXAM: PORTABLE CHEST 1 VIEW COMPARISON:  CTA chest 08/21/2019 FINDINGS: Redemonstrated circumferential pleural thickening and multiple pulmonary nodules throughout the right lung, similar to comparison radiographs and CT. Left lung appears hyperexpanded with some coarse interstitial changes compatible with emphysematous features on comparison studies. No acute consolidative process is seen.  No pneumothorax. Blunting of the left costophrenic sulcus likely related to increased lung volumes. The cardiomediastinal contours are unremarkable. No acute osseous or soft tissue abnormality. Multiple support devices and telemetry leads overlie the chest. IMPRESSION: 1. Findings consistent with the patient's known lung cancer. No acute cardiopulmonary findings. Electronically Signed   By: Lovena Le M.D.   On: 08/25/2019 21:19    Procedures  MDM   Patient here with chest pain and shortness of breath after being discharged from the hospital.  He was unable to use his nebulizer at home.  CT angiogram pending.  Rapid Covid test negative.  Initial provider suspects patient will need admission given reported new oxygen requirement.  On my exam, the patient does not have a significant increase in oxygen requirement as he is only on 4 L.  Patient reduced to 2L to monitor SPO2.  Will give albuterol nebulizer.  CT angio without evidence of pulmonary embolism.  Patient may be able to be discharged home if his oxygen saturations maintain within normal limits during ambulation on 2 L via nasal cannula.  1:28 AM Patient reports he is feeling significantly better and his symptoms have all but resolved.  He has maintained his oxygen saturations on 2 L via nasal cannula even with ambulation.  He has no significant dyspnea at rest.  Discussed admission versus discharge home and patient feels comfortable with discharge home.  He has 2 follow-up appointments this morning.  I think this plan is reasonable.  Discussed reasons to return immediately to the emergency department.  Shortness of breath      Milady Fleener, Gwenlyn Perking 08/26/19 0129    Maudie Flakes, MD 08/26/19 2225

## 2019-08-26 ENCOUNTER — Telehealth: Payer: Self-pay | Admitting: Physician Assistant

## 2019-08-26 ENCOUNTER — Ambulatory Visit
Admission: RE | Admit: 2019-08-26 | Discharge: 2019-08-26 | Disposition: A | Discharge status: Home / Self Care | Payer: Managed Care, Other (non HMO) | Source: Ambulatory Visit | Attending: Radiation Oncology | Admitting: Radiation Oncology

## 2019-08-26 ENCOUNTER — Other Ambulatory Visit: Payer: Self-pay

## 2019-08-26 ENCOUNTER — Other Ambulatory Visit: Payer: Managed Care, Other (non HMO) | Admitting: *Deleted

## 2019-08-26 DIAGNOSIS — C3492 Malignant neoplasm of unspecified part of left bronchus or lung: Secondary | ICD-10-CM | POA: Insufficient documentation

## 2019-08-26 DIAGNOSIS — Z510 Encounter for antineoplastic radiation therapy: Secondary | ICD-10-CM | POA: Insufficient documentation

## 2019-08-26 DIAGNOSIS — C7931 Secondary malignant neoplasm of brain: Secondary | ICD-10-CM

## 2019-08-26 DIAGNOSIS — C20 Malignant neoplasm of rectum: Secondary | ICD-10-CM

## 2019-08-26 DIAGNOSIS — C7949 Secondary malignant neoplasm of other parts of nervous system: Secondary | ICD-10-CM

## 2019-08-26 DIAGNOSIS — C7951 Secondary malignant neoplasm of bone: Secondary | ICD-10-CM

## 2019-08-26 LAB — SARS CORONAVIRUS 2 (TAT 6-24 HRS): SARS Coronavirus 2: NEGATIVE

## 2019-08-26 MED ORDER — SODIUM CHLORIDE 0.9% FLUSH
10.0000 mL | INTRAVENOUS | Status: AC
  Administered 2019-08-26: 10 mL via INTRAVENOUS

## 2019-08-26 NOTE — Progress Notes (Signed)
Has armband been applied?  Yes.    Does patient have an allergy to IV contrast dye?: No.   Has patient ever received premedication for IV contrast dye?: No.   Does patient take metformin?: No.  If patient does take metformin when was the last dose: n/a    IV site: antecubital right, condition no redness  Has IV site been added to flowsheet?  Yes.    BP (!) (P) 138/92   Pulse (P) 88   Temp (P) 98 F (36.7 C)   Resp (P) 18   Ht (P) 5\' 9"  (1.753 m)   Wt (P) 111 lb (50.3 kg)   SpO2 (P) 97% Comment: on three liter O2 via nasal cannula  BMI (P) 16.39 kg/m

## 2019-08-26 NOTE — Telephone Encounter (Signed)
Scheduled appt per 12/23 los.  Spoke with pt and he is aware of the appt date and time,.

## 2019-08-26 NOTE — ED Notes (Signed)
Patient verbalizes understanding of discharge instructions. Opportunity for questioning and answers were provided. Armband removed by staff, pt discharged from ED. Pt. ambulatory and discharged home.  

## 2019-08-26 NOTE — Discharge Instructions (Addendum)
1. Medications: Home meds including albuterol nebulizer as needed 2. Treatment: rest, drink plenty of fluids, wear oxygen as directed 3. Follow Up: Please followup at your appointments later this morning; Please return to the ER for worsening pain, shortness of breath or other concerns

## 2019-08-26 NOTE — ED Notes (Signed)
Pt was ambulated on O2 and SAT went from 98% to 93% on 2L [place]. Pt was dyspneic on exertion. O2 was removed on ambulation and SAT went from 98% to 86%. Pt was placed by in chair on 2L [place] and O2 SAT went back to 97%.

## 2019-08-27 LAB — CULTURE, BLOOD (ROUTINE X 2)
Culture: NO GROWTH
Culture: NO GROWTH
Special Requests: ADEQUATE
Special Requests: ADEQUATE

## 2019-08-30 ENCOUNTER — Ambulatory Visit (HOSPITAL_COMMUNITY)
Admission: RE | Admit: 2019-08-30 | Discharge: 2019-08-30 | Disposition: A | Discharge status: Home / Self Care | Payer: Managed Care, Other (non HMO) | Source: Ambulatory Visit | Attending: Radiation Oncology | Admitting: Radiation Oncology

## 2019-08-30 ENCOUNTER — Other Ambulatory Visit: Payer: Self-pay

## 2019-08-30 DIAGNOSIS — C7949 Secondary malignant neoplasm of other parts of nervous system: Secondary | ICD-10-CM | POA: Diagnosis present

## 2019-08-30 DIAGNOSIS — C7931 Secondary malignant neoplasm of brain: Secondary | ICD-10-CM | POA: Diagnosis not present

## 2019-08-30 DIAGNOSIS — Z510 Encounter for antineoplastic radiation therapy: Secondary | ICD-10-CM | POA: Diagnosis not present

## 2019-08-30 MED ORDER — GADOBUTROL 1 MMOL/ML IV SOLN
5.0000 mL | Freq: Once | INTRAVENOUS | Status: AC | PRN
  Administered 2019-08-30: 5 mL via INTRAVENOUS

## 2019-08-31 ENCOUNTER — Telehealth: Payer: Self-pay | Admitting: Internal Medicine

## 2019-08-31 DIAGNOSIS — Z510 Encounter for antineoplastic radiation therapy: Secondary | ICD-10-CM | POA: Diagnosis not present

## 2019-09-01 ENCOUNTER — Encounter: Payer: Self-pay | Admitting: *Deleted

## 2019-09-01 ENCOUNTER — Inpatient Hospital Stay: Payer: Managed Care, Other (non HMO)

## 2019-09-01 NOTE — Progress Notes (Signed)
Oncology Nurse Navigator Documentation  Oncology Nurse Navigator Flowsheets 09/01/2019  Abnormal Finding Date -  Confirmed Diagnosis Date -  Diagnosis Status Confirmed Diagnosis Complete  Planned Course of Treatment Chemotherapy;Radiation  Phase of Treatment Radiation  Chemotherapy Pending- Reason: Molecular Testing (Tissue)  Chemotherapy Actual Start Date: 09/08/2019  Radiation Pending-Reason: Oncologist Choice  Radiation Actual Start Date: 09/06/2019  Radiation Expected End Date: 09/16/2019  Navigator Follow Up Date: 09/08/2019  Navigator Follow Up Reason: Chemotherapy  Navigator Location [facility]  Navigator Encounter Type Other:  Telephone -  [facility] Date -  Multidisiplinary Clinic Type -  Treatment Initiated Date 09/08/2019  Patient Visit Type -  Treatment Phase Pre-Tx/Tx Discussion  Barriers/Navigation Needs Coordination of Care  Education -  Interventions Coordination of Care/I followed up on patient treatment plan and schedule and plan is in place at this time.   Acuity Level 2-Minimal Needs (1-2 Barriers Identified)  Coordination of Care Other  Education Method -  Time Spent with Patient 30

## 2019-09-02 ENCOUNTER — Ambulatory Visit
Admission: RE | Admit: 2019-09-02 | Discharge: 2019-09-02 | Disposition: A | Discharge status: Home / Self Care | Payer: Managed Care, Other (non HMO) | Source: Ambulatory Visit | Attending: Radiation Oncology | Admitting: Radiation Oncology

## 2019-09-02 ENCOUNTER — Other Ambulatory Visit: Payer: Self-pay

## 2019-09-02 ENCOUNTER — Telehealth: Payer: Self-pay | Admitting: *Deleted

## 2019-09-02 ENCOUNTER — Other Ambulatory Visit: Payer: Self-pay | Admitting: Physician Assistant

## 2019-09-02 VITALS — BP 131/87 | HR 64 | Temp 98.9°F | Resp 18

## 2019-09-02 DIAGNOSIS — C20 Malignant neoplasm of rectum: Secondary | ICD-10-CM

## 2019-09-02 DIAGNOSIS — G893 Neoplasm related pain (acute) (chronic): Secondary | ICD-10-CM

## 2019-09-02 DIAGNOSIS — Z510 Encounter for antineoplastic radiation therapy: Secondary | ICD-10-CM | POA: Diagnosis not present

## 2019-09-02 DIAGNOSIS — C7931 Secondary malignant neoplasm of brain: Secondary | ICD-10-CM

## 2019-09-02 MED ORDER — OXYCODONE-ACETAMINOPHEN 5-325 MG PO TABS: 1.0000 | ORAL_TABLET | Freq: Three times a day (TID) | ORAL | 0 refills | Status: DC | PRN

## 2019-09-02 NOTE — Progress Notes (Signed)
  Radiation Oncology         [phone] ________________________________  Stereotactic Treatment Procedure Note  Name: Jack Lester MRN: [id number]  Date: 09/02/2019  DOB: Jan 19, 1961  SPECIAL TREATMENT PROCEDURE    ICD-10-CM   1. Secondary malignant neoplasm of brain and spinal cord (HCC)  C79.31    C79.49   2. Brain metastases ([place])  C79.31     3D TREATMENT PLANNING AND DOSIMETRY:  The patient's radiation plan was reviewed and approved by neurosurgery and radiation oncology prior to treatment.  It showed 3-dimensional radiation distributions overlaid onto the planning CT/MRI image set.  The [facility] for the target structures as well as the organs at risk were reviewed. The documentation of the 3D plan and dosimetry are filed in the radiation oncology EMR.  NARRATIVE:  Jack Lester was brought to the TrueBeam stereotactic radiation treatment machine and placed supine on the CT couch. The head frame was applied, and the patient was set up for stereotactic radiosurgery.  Neurosurgery was present for the set-up and delivery  SIMULATION VERIFICATION:  In the couch zero-angle position, the patient underwent Exactrac imaging using the Brainlab system with orthogonal KV images.  These were carefully aligned and repeated to confirm treatment position for each of the isocenters.  The Exactrac snap film verification was repeated at each couch angle.  PROCEDURE: Jack Lester received stereotactic radiosurgery to the following targets: Left frontal 6 mm target was treated using 3 Dynamic Conformal Arcs to a prescription dose of 20 Gy.  ExacTrac registration was performed for each couch angle.  The 100% isodose line was prescribed.  6 MV X-rays were delivered in the flattening filter free beam mode.  STEREOTACTIC TREATMENT MANAGEMENT:  Following delivery, the patient was transported to nursing in stable condition and monitored for possible acute effects.  Vital signs were recorded BP 131/87   Pulse 64    Temp 98.9 F (37.2 C)   Resp 18   SpO2 100% . The patient tolerated treatment without significant acute effects, and was discharged to home in stable condition.    PLAN: Patient will complete palliative radiotherapy to symptomatic extracranial sites and then follow-up in one month.  ________________________________  Sheral Apley. Tammi Klippel, M.D.

## 2019-09-02 NOTE — Progress Notes (Signed)
  Name: Jack Lester  MRN: [id number]  Date: 09/02/2019   DOB: 06/27/1961  Stereotactic Radiosurgery Operative Note  PRE-OPERATIVE DIAGNOSIS:  Single Brain Metastasis  POST-OPERATIVE DIAGNOSIS:  Single Brain Metastasis  PROCEDURE:  Stereotactic Radiosurgery  SURGEON:  Judith Part, MD  NARRATIVE: The patient underwent a radiation treatment planning session in the radiation oncology simulation suite under the care of the radiation oncology physician and physicist.  I participated closely in the radiation treatment planning afterwards. The patient underwent planning CT which was fused to 3T high resolution MRI with 1 mm axial slices.  These images were fused on the planning system.  We contoured the gross target volumes and subsequently expanded this to yield the Planning Target Volume. I actively participated in the planning process.  I helped to define and review the target contours and also the contours of the optic pathway, eyes, brainstem and selected nearby organs at risk.  All the dose constraints for critical structures were reviewed and compared to AAPM Task Group 101.  The prescription dose conformity was reviewed.  I approved the plan electronically.    Accordingly, Jack Lester was brought to the TrueBeam stereotactic radiation treatment linac and placed in the custom immobilization mask.  The patient was aligned according to the IR fiducial markers with BrainLab Exactrac, then orthogonal x-rays were used in ExacTrac with the 6DOF robotic table and the shifts were made to align the patient  Jack Lester received stereotactic radiosurgery uneventfully.    Lesions treated:  1   Complex lesions treated:  0 (>3.5 cm, <53mm of optic path, or within the brainstem)   The detailed description of the procedure is recorded in the radiation oncology procedure note.  I was present for the duration of the procedure.  DISPOSITION:  Following delivery, the patient was transported to nursing in  stable condition and monitored for possible acute effects to be discharged to home in stable condition with follow-up in one month.  Judith Part, MD 09/02/2019 12:24 PM

## 2019-09-02 NOTE — Progress Notes (Signed)
Patient remained in the nursing clinic for observation for approximately 30 minutes. No acute side effects from [facility] noted. Vitals stable. Patient denies any new neurologic symptoms. Instructed patient to avoid strenuous activity for the next 24 hours. Instructed patient to phone [phone] and request the radiation oncologist on call for needs related to his treatment today. Patient verbalized understanding of all reviewed. Patient discharged home in no distress. Patient ambulated out of the clinic to the lobby with NT, Alva Garnet, without incident.   BP 131/87   Pulse 64   Temp 98.9 F (37.2 C)   Resp 18   SpO2 100%

## 2019-09-02 NOTE — Telephone Encounter (Signed)
Patient called requesting more pain medication.  He states that his pain is still present at a 10 out of 10 from the rectum up the back.  He reports taking Oxycodone 5 mg q 8 hrs not as indicated and instead is taking it every 3- 4 hours for the pain.  Therefore, he has ran out of medication prior to refill available.  Routing to [employer] for review and consideration to refill.  If refilled please send to [facility] on file.

## 2019-09-06 ENCOUNTER — Ambulatory Visit: Payer: Managed Care, Other (non HMO)

## 2019-09-06 ENCOUNTER — Ambulatory Visit
Admission: RE | Admit: 2019-09-06 | Discharge: 2019-09-06 | Disposition: A | Discharge status: Home / Self Care | Payer: Managed Care, Other (non HMO) | Source: Ambulatory Visit | Attending: Radiation Oncology | Admitting: Radiation Oncology

## 2019-09-06 ENCOUNTER — Other Ambulatory Visit: Payer: Self-pay

## 2019-09-06 ENCOUNTER — Encounter (HOSPITAL_COMMUNITY): Payer: Self-pay | Admitting: Internal Medicine

## 2019-09-06 DIAGNOSIS — G893 Neoplasm related pain (acute) (chronic): Secondary | ICD-10-CM | POA: Diagnosis not present

## 2019-09-06 DIAGNOSIS — C7951 Secondary malignant neoplasm of bone: Secondary | ICD-10-CM | POA: Diagnosis not present

## 2019-09-06 DIAGNOSIS — C7931 Secondary malignant neoplasm of brain: Secondary | ICD-10-CM | POA: Insufficient documentation

## 2019-09-06 DIAGNOSIS — C3411 Malignant neoplasm of upper lobe, right bronchus or lung: Secondary | ICD-10-CM | POA: Diagnosis present

## 2019-09-06 DIAGNOSIS — I10 Essential (primary) hypertension: Secondary | ICD-10-CM | POA: Diagnosis not present

## 2019-09-06 DIAGNOSIS — C3492 Malignant neoplasm of unspecified part of left bronchus or lung: Secondary | ICD-10-CM | POA: Insufficient documentation

## 2019-09-06 DIAGNOSIS — T451X5A Adverse effect of antineoplastic and immunosuppressive drugs, initial encounter: Secondary | ICD-10-CM | POA: Diagnosis not present

## 2019-09-06 DIAGNOSIS — Z7952 Long term (current) use of systemic steroids: Secondary | ICD-10-CM | POA: Diagnosis not present

## 2019-09-06 DIAGNOSIS — C20 Malignant neoplasm of rectum: Secondary | ICD-10-CM | POA: Insufficient documentation

## 2019-09-06 DIAGNOSIS — C782 Secondary malignant neoplasm of pleura: Secondary | ICD-10-CM | POA: Diagnosis not present

## 2019-09-06 DIAGNOSIS — Z5111 Encounter for antineoplastic chemotherapy: Secondary | ICD-10-CM | POA: Diagnosis present

## 2019-09-06 DIAGNOSIS — Z7951 Long term (current) use of inhaled steroids: Secondary | ICD-10-CM | POA: Diagnosis not present

## 2019-09-06 DIAGNOSIS — Z5189 Encounter for other specified aftercare: Secondary | ICD-10-CM | POA: Diagnosis not present

## 2019-09-06 DIAGNOSIS — Z79899 Other long term (current) drug therapy: Secondary | ICD-10-CM | POA: Diagnosis not present

## 2019-09-06 DIAGNOSIS — Z510 Encounter for antineoplastic radiation therapy: Secondary | ICD-10-CM | POA: Insufficient documentation

## 2019-09-06 DIAGNOSIS — Z5112 Encounter for antineoplastic immunotherapy: Secondary | ICD-10-CM | POA: Diagnosis not present

## 2019-09-06 DIAGNOSIS — R197 Diarrhea, unspecified: Secondary | ICD-10-CM | POA: Diagnosis not present

## 2019-09-06 DIAGNOSIS — D701 Agranulocytosis secondary to cancer chemotherapy: Secondary | ICD-10-CM | POA: Diagnosis not present

## 2019-09-06 DIAGNOSIS — J449 Chronic obstructive pulmonary disease, unspecified: Secondary | ICD-10-CM | POA: Diagnosis not present

## 2019-09-06 DIAGNOSIS — Z791 Long term (current) use of non-steroidal anti-inflammatories (NSAID): Secondary | ICD-10-CM | POA: Diagnosis not present

## 2019-09-06 NOTE — Telephone Encounter (Signed)
09/02/2019 made patient aware of refill and instructions to take as directed and not to take more frequently.

## 2019-09-07 ENCOUNTER — Other Ambulatory Visit (INDEPENDENT_AMBULATORY_CARE_PROVIDER_SITE_OTHER): Payer: Self-pay | Admitting: Primary Care

## 2019-09-07 ENCOUNTER — Ambulatory Visit: Payer: Managed Care, Other (non HMO)

## 2019-09-07 ENCOUNTER — Ambulatory Visit
Admission: RE | Admit: 2019-09-07 | Discharge: 2019-09-07 | Disposition: A | Discharge status: Home / Self Care | Payer: Managed Care, Other (non HMO) | Source: Ambulatory Visit | Attending: Radiation Oncology | Admitting: Radiation Oncology

## 2019-09-07 ENCOUNTER — Other Ambulatory Visit: Payer: Self-pay

## 2019-09-07 ENCOUNTER — Encounter: Payer: Self-pay | Admitting: *Deleted

## 2019-09-07 DIAGNOSIS — Z5112 Encounter for antineoplastic immunotherapy: Secondary | ICD-10-CM | POA: Diagnosis not present

## 2019-09-07 LAB — GUARDANT 360

## 2019-09-07 NOTE — Progress Notes (Signed)
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 iron supplement since his stools continue to have the presence of blood in them due to his CRC. He denies any abdominal pain or fevers.   He will continue to use oxycodone for pain. He is beginning palliative radiotherapy today which will hopefully help with his cancer related bone pain.   The patient was advised to call immediately if he has any concerning symptoms in the interval. The patient voices understanding of current disease status and treatment options and is in agreement with the current care plan. All questions were answered. The patient knows to call the clinic with any problems, questions or concerns. We can certainly see the patient much sooner if necessary  No orders of the defined types were placed in this encounter.    Kathleen Tamm L Edgel Degnan, PA-C 09/08/19  ADDENDUM: Hematology/Oncology Attending: I had a face-to-face encounter with the patient today.  I recommended his care plan.  This is a very pleasant 59 years old African-American male with metastatic non-small cell lung cancer, adenocarcinoma with no actionable mutation and PD-L1 expression of 20%. The patient also has rectal adenocarcinoma that currently being monitored with no  intervention. He has been complaining of bloody diarrhea for several days.  There is some mild drop in his hemoglobin and hematocrit.  I recommended for the patient to start taking oral iron tablets at regular basis. The patient is also undergoing palliative radiotherapy to the painful bone disease on the right side of the chest. He will start the first cycle of his systemic chemotherapy with carboplatin, Alimta and Keytruda today. He will continue with his current pain medication for now. We will see the patient back for follow-up visit in 1 week for evaluation and management of any adverse effect of his treatment. He was advised to call immediately if he has any concerning symptoms in the interval.  Disclaimer: This Jack Lester was dictated with voice recognition software. Similar sounding words can inadvertently be transcribed and may be missed upon review. Eilleen Kempf, MD 09/08/19

## 2019-09-07 NOTE — Progress Notes (Signed)
Oncology Nurse Navigator Documentation  Oncology Nurse Navigator Flowsheets 09/07/2019  Abnormal Finding Date -  Confirmed Diagnosis Date -  Diagnosis Status -  Planned Course of Treatment -  Phase of Treatment -  Chemotherapy Pending- Reason: -  Chemotherapy Actual Start Date: -  Radiation Pending-Reason: -  Radiation Actual Start Date: -  Radiation Expected End Date: -  Navigator Follow Up Date: -  Navigator Follow Up Reason: -  Navigator Location [facility]  Navigator Encounter Type Other/I followed up on PDL 1 with [facility].  This test has been completed and Dr. Julien Nordmann is updated.   I contacted auth coordinator to check on chemo auth for Mr. Alberico.    Telephone -  [facility] Date -  Multidisiplinary Clinic Type -  Treatment Initiated Date -  Patient Visit Type -  Treatment Phase Treatment  Barriers/Navigation Needs Coordination of Care  Education -  Interventions Coordination of Care  Acuity Level 2-Minimal Needs (1-2 Barriers Identified)  Coordination of Care Other  Education Method -  Time Spent with Patient 30

## 2019-09-07 NOTE — Telephone Encounter (Signed)
FWD to PCP

## 2019-09-08 ENCOUNTER — Inpatient Hospital Stay: Payer: Managed Care, Other (non HMO) | Attending: Internal Medicine

## 2019-09-08 ENCOUNTER — Inpatient Hospital Stay: Payer: Managed Care, Other (non HMO)

## 2019-09-08 ENCOUNTER — Encounter (HOSPITAL_COMMUNITY): Payer: Self-pay | Admitting: Internal Medicine

## 2019-09-08 ENCOUNTER — Ambulatory Visit: Payer: Managed Care, Other (non HMO)

## 2019-09-08 ENCOUNTER — Inpatient Hospital Stay: Payer: Managed Care, Other (non HMO) | Admitting: Nutrition

## 2019-09-08 ENCOUNTER — Encounter: Payer: Self-pay | Admitting: Physician Assistant

## 2019-09-08 ENCOUNTER — Encounter: Payer: Self-pay | Admitting: Internal Medicine

## 2019-09-08 ENCOUNTER — Inpatient Hospital Stay (HOSPITAL_BASED_OUTPATIENT_CLINIC_OR_DEPARTMENT_OTHER): Payer: Managed Care, Other (non HMO) | Admitting: Physician Assistant

## 2019-09-08 ENCOUNTER — Other Ambulatory Visit: Payer: Self-pay

## 2019-09-08 ENCOUNTER — Ambulatory Visit
Admission: RE | Admit: 2019-09-08 | Discharge: 2019-09-08 | Disposition: A | Discharge status: Home / Self Care | Payer: Managed Care, Other (non HMO) | Source: Ambulatory Visit | Attending: Radiation Oncology | Admitting: Radiation Oncology

## 2019-09-08 VITALS — BP 150/97 | HR 62 | Temp 98.0°F | Resp 17 | Ht 69.0 in | Wt 117.1 lb

## 2019-09-08 VITALS — BP 140/84 | HR 66

## 2019-09-08 DIAGNOSIS — J449 Chronic obstructive pulmonary disease, unspecified: Secondary | ICD-10-CM | POA: Insufficient documentation

## 2019-09-08 DIAGNOSIS — T451X5A Adverse effect of antineoplastic and immunosuppressive drugs, initial encounter: Secondary | ICD-10-CM | POA: Insufficient documentation

## 2019-09-08 DIAGNOSIS — R197 Diarrhea, unspecified: Secondary | ICD-10-CM | POA: Insufficient documentation

## 2019-09-08 DIAGNOSIS — I10 Essential (primary) hypertension: Secondary | ICD-10-CM | POA: Insufficient documentation

## 2019-09-08 DIAGNOSIS — Z5112 Encounter for antineoplastic immunotherapy: Secondary | ICD-10-CM | POA: Insufficient documentation

## 2019-09-08 DIAGNOSIS — Z5111 Encounter for antineoplastic chemotherapy: Secondary | ICD-10-CM | POA: Insufficient documentation

## 2019-09-08 DIAGNOSIS — C3492 Malignant neoplasm of unspecified part of left bronchus or lung: Secondary | ICD-10-CM

## 2019-09-08 DIAGNOSIS — Z791 Long term (current) use of non-steroidal anti-inflammatories (NSAID): Secondary | ICD-10-CM | POA: Insufficient documentation

## 2019-09-08 DIAGNOSIS — Z510 Encounter for antineoplastic radiation therapy: Secondary | ICD-10-CM | POA: Insufficient documentation

## 2019-09-08 DIAGNOSIS — G893 Neoplasm related pain (acute) (chronic): Secondary | ICD-10-CM | POA: Diagnosis not present

## 2019-09-08 DIAGNOSIS — Z7951 Long term (current) use of inhaled steroids: Secondary | ICD-10-CM | POA: Insufficient documentation

## 2019-09-08 DIAGNOSIS — Z7952 Long term (current) use of systemic steroids: Secondary | ICD-10-CM | POA: Insufficient documentation

## 2019-09-08 DIAGNOSIS — C782 Secondary malignant neoplasm of pleura: Secondary | ICD-10-CM | POA: Insufficient documentation

## 2019-09-08 DIAGNOSIS — D701 Agranulocytosis secondary to cancer chemotherapy: Secondary | ICD-10-CM | POA: Insufficient documentation

## 2019-09-08 DIAGNOSIS — C7931 Secondary malignant neoplasm of brain: Secondary | ICD-10-CM | POA: Insufficient documentation

## 2019-09-08 DIAGNOSIS — C20 Malignant neoplasm of rectum: Secondary | ICD-10-CM | POA: Insufficient documentation

## 2019-09-08 DIAGNOSIS — Z79899 Other long term (current) drug therapy: Secondary | ICD-10-CM | POA: Insufficient documentation

## 2019-09-08 DIAGNOSIS — C3411 Malignant neoplasm of upper lobe, right bronchus or lung: Secondary | ICD-10-CM | POA: Insufficient documentation

## 2019-09-08 DIAGNOSIS — R918 Other nonspecific abnormal finding of lung field: Secondary | ICD-10-CM

## 2019-09-08 DIAGNOSIS — C7951 Secondary malignant neoplasm of bone: Secondary | ICD-10-CM | POA: Insufficient documentation

## 2019-09-08 DIAGNOSIS — Z5189 Encounter for other specified aftercare: Secondary | ICD-10-CM | POA: Insufficient documentation

## 2019-09-08 LAB — CMP (CANCER CENTER ONLY)
ALT: 33 U/L (ref 0–44)
AST: 17 U/L (ref 15–41)
Albumin: 3.3 g/dL — ABNORMAL LOW (ref 3.5–5.0)
Alkaline Phosphatase: 93 U/L (ref 38–126)
Anion gap: 10 (ref 5–15)
BUN: 27 mg/dL — ABNORMAL HIGH (ref 6–20)
CO2: 28 mmol/L (ref 22–32)
Calcium: 8.9 mg/dL (ref 8.9–10.3)
Chloride: 99 mmol/L (ref 98–111)
Creatinine: 0.84 mg/dL (ref 0.61–1.24)
GFR, Est AFR Am: >60 mL/min (ref >60)
GFR, Estimated: >60 mL/min (ref >60)
Glucose, Bld: 102 mg/dL — ABNORMAL HIGH (ref 70–99)
Potassium: 4.3 mmol/L (ref 3.5–5.1)
Sodium: 137 mmol/L (ref 135–145)
Total Bilirubin: <0.2 mg/dL — ABNORMAL LOW (ref 0.3–1.2)
Total Protein: 6.6 g/dL (ref 6.5–8.1)

## 2019-09-08 LAB — CBC WITH DIFFERENTIAL (CANCER CENTER ONLY)
Abs Immature Granulocytes: 0.3 10*3/uL — ABNORMAL HIGH (ref 0.00–0.07)
Basophils Absolute: 0 10*3/uL (ref 0.0–0.1)
Basophils Relative: 0 %
Eosinophils Absolute: 0.1 10*3/uL (ref 0.0–0.5)
Eosinophils Relative: 0 %
HCT: 38.1 % — ABNORMAL LOW (ref 39.0–52.0)
Hemoglobin: 12.5 g/dL — ABNORMAL LOW (ref 13.0–17.0)
Immature Granulocytes: 1 %
Lymphocytes Relative: 4 %
Lymphs Abs: 1.1 10*3/uL (ref 0.7–4.0)
MCH: 33.9 pg (ref 26.0–34.0)
MCHC: 32.8 g/dL (ref 30.0–36.0)
MCV: 103.3 fL — ABNORMAL HIGH (ref 80.0–100.0)
Monocytes Absolute: 1.3 10*3/uL — ABNORMAL HIGH (ref 0.1–1.0)
Monocytes Relative: 5 %
Neutro Abs: 24.2 10*3/uL — ABNORMAL HIGH (ref 1.7–7.7)
Neutrophils Relative %: 90 %
Platelet Count: 297 10*3/uL (ref 150–400)
RBC: 3.69 MIL/uL — ABNORMAL LOW (ref 4.22–5.81)
RDW: 15.5 % (ref 11.5–15.5)
WBC Count: 27 10*3/uL — ABNORMAL HIGH (ref 4.0–10.5)
nRBC: 0 % (ref 0.0–0.2)

## 2019-09-08 MED ORDER — PALONOSETRON HCL INJECTION 0.25 MG/5ML
0.2500 mg | Freq: Once | INTRAVENOUS | Status: AC
  Administered 2019-09-08: 0.25 mg via INTRAVENOUS

## 2019-09-08 MED ORDER — SODIUM CHLORIDE 0.9 % IV SOLN: 10.0000 mg | Freq: Once | INTRAVENOUS | Status: DC

## 2019-09-08 MED ORDER — DEXAMETHASONE SODIUM PHOSPHATE 10 MG/ML IJ SOLN
10.0000 mg | Freq: Once | INTRAMUSCULAR | Status: AC
  Administered 2019-09-08: 10 mg via INTRAVENOUS

## 2019-09-08 MED ORDER — SODIUM CHLORIDE 0.9 % IV SOLN
Freq: Once | INTRAVENOUS | Status: AC
  Filled 2019-09-08: qty 250

## 2019-09-08 MED ORDER — SODIUM CHLORIDE 0.9 % IV SOLN
200.0000 mg | Freq: Once | INTRAVENOUS | Status: AC
  Administered 2019-09-08: 200 mg via INTRAVENOUS
  Filled 2019-09-08: qty 8

## 2019-09-08 MED ORDER — SODIUM CHLORIDE 0.9 % IV SOLN
150.0000 mg | Freq: Once | INTRAVENOUS | Status: AC
  Administered 2019-09-08: 150 mg via INTRAVENOUS
  Filled 2019-09-08: qty 5

## 2019-09-08 MED ORDER — DEXAMETHASONE SODIUM PHOSPHATE 10 MG/ML IJ SOLN
INTRAMUSCULAR | Status: AC
  Filled 2019-09-08: qty 1

## 2019-09-08 MED ORDER — SODIUM CHLORIDE 0.9 % IV SOLN
497.5000 mg | Freq: Once | INTRAVENOUS | Status: AC
  Administered 2019-09-08: 12:00:00 500 mg via INTRAVENOUS
  Filled 2019-09-08: qty 50

## 2019-09-08 MED ORDER — SODIUM CHLORIDE 0.9 % IV SOLN
500.0000 mg/m2 | Freq: Once | INTRAVENOUS | Status: AC
  Administered 2019-09-08: 800 mg via INTRAVENOUS
  Filled 2019-09-08: qty 20

## 2019-09-08 MED ORDER — PALONOSETRON HCL INJECTION 0.25 MG/5ML
INTRAVENOUS | Status: AC
  Filled 2019-09-08: qty 5

## 2019-09-08 NOTE — Patient Instructions (Signed)
[facility] Discharge Instructions for Patients Receiving Chemotherapy  Today you received the following chemotherapy agents: Keytruda, Alimta, Carboplatin  To help prevent nausea and vomiting after your treatment, we encourage you to take your nausea medication as directed   If you develop nausea and vomiting that is not controlled by your nausea medication, call the clinic.   BELOW ARE SYMPTOMS THAT SHOULD BE REPORTED IMMEDIATELY:  *FEVER GREATER THAN 100.5 F  *CHILLS WITH OR WITHOUT FEVER  NAUSEA AND VOMITING THAT IS NOT CONTROLLED WITH YOUR NAUSEA MEDICATION  *UNUSUAL SHORTNESS OF BREATH  *UNUSUAL BRUISING OR BLEEDING  TENDERNESS IN MOUTH AND THROAT WITH OR WITHOUT PRESENCE OF ULCERS  *URINARY PROBLEMS  *BOWEL PROBLEMS  UNUSUAL RASH Items with * indicate a potential emergency and should be followed up as soon as possible.  Feel free to call the clinic should you have any questions or concerns. The clinic phone number is [phone].  Please show the [place] at check-in to the Emergency Department and triage nurse.  Pembrolizumab injection What is this medicine? PEMBROLIZUMAB (pem broe liz ue mab) is a monoclonal antibody. It is used to treat certain types of cancer. This medicine may be used for other purposes; ask your health care provider or pharmacist if you have questions. COMMON BRAND NAME(S): Keytruda What should I tell my health care provider before I take this medicine? They need to know if you have any of these conditions:  diabetes  immune system problems  inflammatory bowel disease  liver disease  lung or breathing disease  lupus  received or scheduled to receive an organ transplant or a stem-cell transplant that uses donor stem cells  an unusual or allergic reaction to pembrolizumab, other medicines, foods, dyes, or preservatives  pregnant or trying to get pregnant  breast-feeding How should I use this  medicine? This medicine is for infusion into a vein. It is given by a health care professional in a hospital or clinic setting. A special MedGuide will be given to you before each treatment. Be sure to read this information carefully each time. Talk to your pediatrician regarding the use of this medicine in children. While this drug may be prescribed for children as young as 6 months for selected conditions, precautions do apply. Overdosage: If you think you have taken too much of this medicine contact a poison control center or emergency room at once. NOTE: This medicine is only for you. Do not share this medicine with others. What if I miss a dose? It is important not to miss your dose. Call your doctor or health care professional if you are unable to keep an appointment. What may interact with this medicine? Interactions have not been studied. Give your health care provider a list of all the medicines, herbs, non-prescription drugs, or dietary supplements you use. Also tell them if you smoke, drink alcohol, or use illegal drugs. Some items may interact with your medicine. This list may not describe all possible interactions. Give your health care provider a list of all the medicines, herbs, non-prescription drugs, or dietary supplements you use. Also tell them if you smoke, drink alcohol, or use illegal drugs. Some items may interact with your medicine. What should I watch for while using this medicine? Your condition will be monitored carefully while you are receiving this medicine. You may need blood work done while you are taking this medicine. Do not become pregnant while taking this medicine or for 4 months after stopping it.  Women should inform their doctor if they wish to become pregnant or think they might be pregnant. There is a potential for serious side effects to an unborn child. Talk to your health care professional or pharmacist for more information. Do not breast-feed an infant while  taking this medicine or for 4 months after the last dose. What side effects may I notice from receiving this medicine? Side effects that you should report to your doctor or health care professional as soon as possible:  allergic reactions like skin rash, itching or hives, swelling of the face, lips, or tongue  bloody or black, tarry  breathing problems  changes in vision  chest pain  chills  confusion  constipation  cough  diarrhea  dizziness or feeling faint or lightheaded  fast or irregular heartbeat  fever  flushing  joint pain  low blood counts - this medicine may decrease the number of white blood cells, red blood cells and platelets. You may be at increased risk for infections and bleeding.  muscle pain  muscle weakness  pain, tingling, numbness in the hands or feet  persistent headache  redness, blistering, peeling or loosening of the skin, including inside the mouth  signs and symptoms of high blood sugar such as dizziness; dry mouth; dry skin; fruity breath; nausea; stomach pain; increased hunger or thirst; increased urination  signs and symptoms of kidney injury like trouble passing urine or change in the amount of urine  signs and symptoms of liver injury like dark urine, light-colored stools, loss of appetite, nausea, right upper belly pain, yellowing of the eyes or skin  sweating  swollen lymph nodes  weight loss Side effects that usually do not require medical attention (report to your doctor or health care professional if they continue or are bothersome):  decreased appetite  hair loss  muscle pain  tiredness This list may not describe all possible side effects. Call your doctor for medical advice about side effects. You may report side effects to FDA at 1-800-FDA-1088. Where should I keep my medicine? This drug is given in a hospital or clinic and will not be stored at home. NOTE: This sheet is a summary. It may not cover all  possible information. If you have questions about this medicine, talk to your doctor, pharmacist, or health care provider.  2020 Elsevier/Gold Standard (2019-06-25 18:07:58)  Pemetrexed injection What is this medicine? PEMETREXED (PEM e TREX ed) is a chemotherapy drug used to treat lung cancers like non-small cell lung cancer and mesothelioma. It may also be used to treat other cancers. This medicine may be used for other purposes; ask your health care provider or pharmacist if you have questions. COMMON BRAND NAME(S): Alimta What should I tell my health care provider before I take this medicine? They need to know if you have any of these conditions:  infection (especially a virus infection such as chickenpox, cold sores, or herpes)  kidney disease  low blood counts, like low white cell, platelet, or red cell counts  lung or breathing disease, like asthma  radiation therapy  an unusual or allergic reaction to pemetrexed, other medicines, foods, dyes, or preservative  pregnant or trying to get pregnant  breast-feeding How should I use this medicine? This drug is given as an infusion into a vein. It is administered in a hospital or clinic by a specially trained health care professional. Talk to your pediatrician regarding the use of this medicine in children. Special care may be needed. Overdosage: If  you think you have taken too much of this medicine contact a poison control center or emergency room at once. NOTE: This medicine is only for you. Do not share this medicine with others. What if I miss a dose? It is important not to miss your dose. Call your doctor or health care professional if you are unable to keep an appointment. What may interact with this medicine? This medicine may interact with the following medications:  Ibuprofen This list may not describe all possible interactions. Give your health care provider a list of all the medicines, herbs, non-prescription drugs,  or dietary supplements you use. Also tell them if you smoke, drink alcohol, or use illegal drugs. Some items may interact with your medicine. What should I watch for while using this medicine? Visit your doctor for checks on your progress. This drug may make you feel generally unwell. This is not uncommon, as chemotherapy can affect healthy cells as well as cancer cells. Report any side effects. Continue your course of treatment even though you feel ill unless your doctor tells you to stop. In some cases, you may be given additional medicines to help with side effects. Follow all directions for their use. Call your doctor or health care professional for advice if you get a fever, chills or sore throat, or other symptoms of a cold or flu. Do not treat yourself. This drug decreases your body's ability to fight infections. Try to avoid being around people who are sick. This medicine may increase your risk to bruise or bleed. Call your doctor or health care professional if you notice any unusual bleeding. Be careful brushing and flossing your teeth or using a toothpick because you may get an infection or bleed more easily. If you have any dental work done, tell your dentist you are receiving this medicine. Avoid taking products that contain aspirin, acetaminophen, ibuprofen, naproxen, or ketoprofen unless instructed by your doctor. These medicines may hide a fever. Call your doctor or health care professional if you get diarrhea or mouth sores. Do not treat yourself. To protect your kidneys, drink water or other fluids as directed while you are taking this medicine. Do not become pregnant while taking this medicine or for 6 months after stopping it. Women should inform their doctor if they wish to become pregnant or think they might be pregnant. Men should not father a child while taking this medicine and for 3 months after stopping it. This may interfere with the ability to father a child. You should talk to  your doctor or health care professional if you are concerned about your fertility. There is a potential for serious side effects to an unborn child. Talk to your health care professional or pharmacist for more information. Do not breast-feed an infant while taking this medicine or for 1 week after stopping it. What side effects may I notice from receiving this medicine? Side effects that you should report to your doctor or health care professional as soon as possible:  allergic reactions like skin rash, itching or hives, swelling of the face, lips, or tongue  breathing problems  redness, blistering, peeling or loosening of the skin, including inside the mouth  signs and symptoms of bleeding such as bloody or black, tarry stools; red or dark-brown urine; spitting up blood or brown material that looks like coffee grounds; red spots on the skin; unusual bruising or bleeding from the eye, gums, or nose  signs and symptoms of infection like fever or chills; cough;  sore throat; pain or trouble passing urine  signs and symptoms of kidney injury like trouble passing urine or change in the amount of urine  signs and symptoms of liver injury like dark yellow or brown urine; general ill feeling or flu-like symptoms; light-colored stools; loss of appetite; nausea; right upper belly pain; unusually weak or tired; yellowing of the eyes or skin Side effects that usually do not require medical attention (report to your doctor or health care professional if they continue or are bothersome):  constipation  mouth sores  nausea, vomiting  unusually weak or tired This list may not describe all possible side effects. Call your doctor for medical advice about side effects. You may report side effects to FDA at 1-800-FDA-1088. Where should I keep my medicine? This drug is given in a hospital or clinic and will not be stored at home. NOTE: This sheet is a summary. It may not cover all possible information. If  you have questions about this medicine, talk to your doctor, pharmacist, or health care provider.  2020 Elsevier/Gold Standard (2017-10-08 16:11:33)  Carboplatin injection What is this medicine? CARBOPLATIN (KAR boe pla tin) is a chemotherapy drug. It targets fast dividing cells, like cancer cells, and causes these cells to die. This medicine is used to treat ovarian cancer and many other cancers. This medicine may be used for other purposes; ask your health care provider or pharmacist if you have questions. COMMON BRAND NAME(S): Paraplatin What should I tell my health care provider before I take this medicine? They need to know if you have any of these conditions:  blood disorders  hearing problems  kidney disease  recent or ongoing radiation therapy  an unusual or allergic reaction to carboplatin, cisplatin, other chemotherapy, other medicines, foods, dyes, or preservatives  pregnant or trying to get pregnant  breast-feeding How should I use this medicine? This drug is usually given as an infusion into a vein. It is administered in a hospital or clinic by a specially trained health care professional. Talk to your pediatrician regarding the use of this medicine in children. Special care may be needed. Overdosage: If you think you have taken too much of this medicine contact a poison control center or emergency room at once. NOTE: This medicine is only for you. Do not share this medicine with others. What if I miss a dose? It is important not to miss a dose. Call your doctor or health care professional if you are unable to keep an appointment. What may interact with this medicine?  medicines for seizures  medicines to increase blood counts like filgrastim, pegfilgrastim, sargramostim  some antibiotics like amikacin, gentamicin, neomycin, streptomycin, tobramycin  vaccines Talk to your doctor or health care professional before taking any of these  medicines:  acetaminophen  aspirin  ibuprofen  ketoprofen  naproxen This list may not describe all possible interactions. Give your health care provider a list of all the medicines, herbs, non-prescription drugs, or dietary supplements you use. Also tell them if you smoke, drink alcohol, or use illegal drugs. Some items may interact with your medicine. What should I watch for while using this medicine? Your condition will be monitored carefully while you are receiving this medicine. You will need important blood work done while you are taking this medicine. This drug may make you feel generally unwell. This is not uncommon, as chemotherapy can affect healthy cells as well as cancer cells. Report any side effects. Continue your course of treatment even though you  feel ill unless your doctor tells you to stop. In some cases, you may be given additional medicines to help with side effects. Follow all directions for their use. Call your doctor or health care professional for advice if you get a fever, chills or sore throat, or other symptoms of a cold or flu. Do not treat yourself. This drug decreases your body's ability to fight infections. Try to avoid being around people who are sick. This medicine may increase your risk to bruise or bleed. Call your doctor or health care professional if you notice any unusual bleeding. Be careful brushing and flossing your teeth or using a toothpick because you may get an infection or bleed more easily. If you have any dental work done, tell your dentist you are receiving this medicine. Avoid taking products that contain aspirin, acetaminophen, ibuprofen, naproxen, or ketoprofen unless instructed by your doctor. These medicines may hide a fever. Do not become pregnant while taking this medicine. Women should inform their doctor if they wish to become pregnant or think they might be pregnant. There is a potential for serious side effects to an unborn child. Talk  to your health care professional or pharmacist for more information. Do not breast-feed an infant while taking this medicine. What side effects may I notice from receiving this medicine? Side effects that you should report to your doctor or health care professional as soon as possible:  allergic reactions like skin rash, itching or hives, swelling of the face, lips, or tongue  signs of infection - fever or chills, cough, sore throat, pain or difficulty passing urine  signs of decreased platelets or bleeding - bruising, pinpoint red spots on the skin, black, tarry stools, nosebleeds  signs of decreased red blood cells - unusually weak or tired, fainting spells, lightheadedness  breathing problems  changes in hearing  changes in vision  chest pain  high blood pressure  low blood counts - This drug may decrease the number of white blood cells, red blood cells and platelets. You may be at increased risk for infections and bleeding.  nausea and vomiting  pain, swelling, redness or irritation at the injection site  pain, tingling, numbness in the hands or feet  problems with balance, talking, walking  trouble passing urine or change in the amount of urine Side effects that usually do not require medical attention (report to your doctor or health care professional if they continue or are bothersome):  hair loss  loss of appetite  metallic taste in the mouth or changes in taste This list may not describe all possible side effects. Call your doctor for medical advice about side effects. You may report side effects to FDA at 1-800-FDA-1088. Where should I keep my medicine? This drug is given in a hospital or clinic and will not be stored at home. NOTE: This sheet is a summary. It may not cover all possible information. If you have questions about this medicine, talk to your doctor, pharmacist, or health care provider.  2020 Elsevier/Gold Standard (2007-11-24 14:38:05)

## 2019-09-08 NOTE — Progress Notes (Signed)
59 year old male diagnosed with metastatic non-small cell lung cancer.  He is a patient of Dr. Julien Nordmann.  He is receiving carboplatin Alimta and Keytruda.  Past medical history includes rectal cancer, hypertension, GERD, COPD.  Medications include Decadron, Colace, Folvite, Lasix, Prilosec, and Compazine.  Labs include glucose 102, BUN 27, albumin 3.3.  Height: 5 feet 9 inches. Weight: 117.1 pounds on January 6. Usual body weight: 150 pounds in May 2020. BMI: 17.29.  Patient reports he has a good appetite and is eating okay.  Reports he has had continual diarrhea.  He was told today to start Imodium. Patient denies other nutrition impact symptoms. Patient enjoys Ensure.  Nutrition diagnosis:  Unintended weight loss related to metastatic cancer as evidenced by 22% weight loss over 7 months which is significant.  Intervention: Educated patient to consume small, frequent meals and snacks utilizing high-calorie, high-protein foods. Encouraged slow consumption of oral nutrition supplements 3 times daily between meals. Educated patient on strategies to improve diarrhea.  Also encouraged medications as prescribed by MD. Fact sheets were provided.  Questions were answered.  Teach back method used.  Contact information provided.  Monitoring, evaluation, goals: Patient will tolerate increased calories and protein to minimize weight loss.  Next visit: Wednesday, January 27 during infusion.  **Disclaimer: This note was dictated with voice recognition software. Similar sounding words can inadvertently be transcribed and this note may contain transcription errors which may not have been corrected upon publication of note.**

## 2019-09-08 NOTE — Progress Notes (Signed)
Met with patient at registration to introduce myself as Financial Resource Specialist and to offer available resources. ° °Discussed one-time $700 [facility] grant and qualifications to assist with personal expenses while going through treatment. ° °Gave him my card if interested in applying and for any additional financial questions or concerns. °

## 2019-09-09 ENCOUNTER — Ambulatory Visit: Payer: Managed Care, Other (non HMO)

## 2019-09-09 ENCOUNTER — Telehealth: Payer: Self-pay | Admitting: Internal Medicine

## 2019-09-09 NOTE — Telephone Encounter (Signed)
Scheduled per los. Called and left msg. Mailed printout  °

## 2019-09-10 ENCOUNTER — Ambulatory Visit: Payer: Managed Care, Other (non HMO)

## 2019-09-10 ENCOUNTER — Ambulatory Visit
Admission: RE | Admit: 2019-09-10 | Discharge: 2019-09-10 | Disposition: A | Discharge status: Home / Self Care | Payer: Managed Care, Other (non HMO) | Source: Ambulatory Visit | Attending: Radiation Oncology | Admitting: Radiation Oncology

## 2019-09-10 ENCOUNTER — Other Ambulatory Visit: Payer: Self-pay | Admitting: Physician Assistant

## 2019-09-10 ENCOUNTER — Telehealth: Payer: Self-pay | Admitting: *Deleted

## 2019-09-10 ENCOUNTER — Other Ambulatory Visit: Payer: Self-pay

## 2019-09-10 DIAGNOSIS — G893 Neoplasm related pain (acute) (chronic): Secondary | ICD-10-CM

## 2019-09-10 DIAGNOSIS — Z5112 Encounter for antineoplastic immunotherapy: Secondary | ICD-10-CM | POA: Diagnosis not present

## 2019-09-10 DIAGNOSIS — C3492 Malignant neoplasm of unspecified part of left bronchus or lung: Secondary | ICD-10-CM

## 2019-09-10 DIAGNOSIS — C20 Malignant neoplasm of rectum: Secondary | ICD-10-CM

## 2019-09-10 MED ORDER — OXYCODONE-ACETAMINOPHEN 5-325 MG PO TABS: 1.0000 | ORAL_TABLET | Freq: Four times a day (QID) | ORAL | 0 refills | Status: DC | PRN

## 2019-09-13 ENCOUNTER — Ambulatory Visit
Admission: RE | Admit: 2019-09-13 | Discharge: 2019-09-13 | Disposition: A | Discharge status: Home / Self Care | Payer: Managed Care, Other (non HMO) | Source: Ambulatory Visit | Attending: Radiation Oncology | Admitting: Radiation Oncology

## 2019-09-13 ENCOUNTER — Ambulatory Visit: Payer: Managed Care, Other (non HMO)

## 2019-09-13 ENCOUNTER — Other Ambulatory Visit: Payer: Self-pay

## 2019-09-13 DIAGNOSIS — Z5112 Encounter for antineoplastic immunotherapy: Secondary | ICD-10-CM | POA: Diagnosis not present

## 2019-09-14 ENCOUNTER — Encounter: Payer: Self-pay | Admitting: Hematology

## 2019-09-14 ENCOUNTER — Other Ambulatory Visit: Payer: Self-pay

## 2019-09-14 ENCOUNTER — Ambulatory Visit: Payer: Managed Care, Other (non HMO)

## 2019-09-14 ENCOUNTER — Ambulatory Visit
Admission: RE | Admit: 2019-09-14 | Discharge: 2019-09-14 | Disposition: A | Discharge status: Home / Self Care | Payer: Managed Care, Other (non HMO) | Source: Ambulatory Visit | Attending: Radiation Oncology | Admitting: Radiation Oncology

## 2019-09-14 DIAGNOSIS — Z5112 Encounter for antineoplastic immunotherapy: Secondary | ICD-10-CM | POA: Diagnosis not present
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
## 2019-09-14 NOTE — Progress Notes (Signed)
Met with patient to complete grant paperwork.  Patient approved for one-time $700 [place] to assist with personal expenses while going through treatment. Gave him a copy of the approval letter as well as the expense sheet along with the Outpatient pharmacy information. He received a gift card today from his grant.  He has my card for any additional financial questions or concerns.

## 2019-09-15 ENCOUNTER — Ambulatory Visit: Admission: RE | Admit: 2019-09-15 | Payer: Managed Care, Other (non HMO) | Source: Ambulatory Visit

## 2019-09-15 ENCOUNTER — Inpatient Hospital Stay (HOSPITAL_BASED_OUTPATIENT_CLINIC_OR_DEPARTMENT_OTHER): Payer: Managed Care, Other (non HMO) | Admitting: Physician Assistant

## 2019-09-15 ENCOUNTER — Inpatient Hospital Stay: Payer: Managed Care, Other (non HMO)

## 2019-09-15 ENCOUNTER — Ambulatory Visit: Payer: Managed Care, Other (non HMO)

## 2019-09-15 ENCOUNTER — Other Ambulatory Visit: Payer: Self-pay

## 2019-09-15 ENCOUNTER — Telehealth: Payer: Self-pay | Admitting: Radiation Oncology

## 2019-09-15 ENCOUNTER — Other Ambulatory Visit: Payer: Self-pay | Admitting: Urology

## 2019-09-15 ENCOUNTER — Ambulatory Visit
Admission: RE | Admit: 2019-09-15 | Discharge: 2019-09-15 | Disposition: A | Discharge status: Home / Self Care | Payer: Managed Care, Other (non HMO) | Source: Ambulatory Visit | Attending: Radiation Oncology | Admitting: Radiation Oncology

## 2019-09-15 ENCOUNTER — Other Ambulatory Visit: Payer: Self-pay | Admitting: Physician Assistant

## 2019-09-15 ENCOUNTER — Encounter: Payer: Self-pay | Admitting: Physician Assistant

## 2019-09-15 VITALS — BP 139/88 | HR 106 | Temp 98.5°F | Resp 20 | Ht 69.0 in | Wt 110.2 lb

## 2019-09-15 DIAGNOSIS — Z5112 Encounter for antineoplastic immunotherapy: Secondary | ICD-10-CM | POA: Diagnosis not present

## 2019-09-15 DIAGNOSIS — B370 Candidal stomatitis: Secondary | ICD-10-CM | POA: Insufficient documentation

## 2019-09-15 DIAGNOSIS — D702 Other drug-induced agranulocytosis: Secondary | ICD-10-CM | POA: Insufficient documentation

## 2019-09-15 DIAGNOSIS — C3492 Malignant neoplasm of unspecified part of left bronchus or lung: Secondary | ICD-10-CM

## 2019-09-15 LAB — CMP (CANCER CENTER ONLY)
ALT: 54 U/L — ABNORMAL HIGH (ref 0–44)
AST: 16 U/L (ref 15–41)
Albumin: 2.9 g/dL — ABNORMAL LOW (ref 3.5–5.0)
Alkaline Phosphatase: 102 U/L (ref 38–126)
Anion gap: 11 (ref 5–15)
BUN: 35 mg/dL — ABNORMAL HIGH (ref 6–20)
CO2: 28 mmol/L (ref 22–32)
Calcium: 8.6 mg/dL — ABNORMAL LOW (ref 8.9–10.3)
Chloride: 99 mmol/L (ref 98–111)
Creatinine: 1.06 mg/dL (ref 0.61–1.24)
GFR, Est AFR Am: >60 mL/min (ref >60)
GFR, Estimated: >60 mL/min (ref >60)
Glucose, Bld: 105 mg/dL — ABNORMAL HIGH (ref 70–99)
Potassium: 4.3 mmol/L (ref 3.5–5.1)
Sodium: 138 mmol/L (ref 135–145)
Total Bilirubin: 0.4 mg/dL (ref 0.3–1.2)
Total Protein: 6.6 g/dL (ref 6.5–8.1)

## 2019-09-15 LAB — CBC WITH DIFFERENTIAL (CANCER CENTER ONLY)
Abs Immature Granulocytes: 0 10*3/uL (ref 0.00–0.07)
Band Neutrophils: 4 %
Basophils Absolute: 0 10*3/uL (ref 0.0–0.1)
Basophils Relative: 0 %
Eosinophils Absolute: 0 10*3/uL (ref 0.0–0.5)
Eosinophils Relative: 0 %
HCT: 36.6 % — ABNORMAL LOW (ref 39.0–52.0)
Hemoglobin: 12.3 g/dL — ABNORMAL LOW (ref 13.0–17.0)
Lymphocytes Relative: 18 %
Lymphs Abs: 0.1 10*3/uL — ABNORMAL LOW (ref 0.7–4.0)
MCH: 33.6 pg (ref 26.0–34.0)
MCHC: 33.6 g/dL (ref 30.0–36.0)
MCV: 100 fL (ref 80.0–100.0)
Metamyelocytes Relative: 2 %
Monocytes Absolute: 0 10*3/uL — ABNORMAL LOW (ref 0.1–1.0)
Monocytes Relative: 8 %
Neutro Abs: 0.4 10*3/uL — CL (ref 1.7–7.7)
Neutrophils Relative %: 68 %
Platelet Count: 112 10*3/uL — ABNORMAL LOW (ref 150–400)
RBC: 3.66 MIL/uL — ABNORMAL LOW (ref 4.22–5.81)
RDW: 15.2 % (ref 11.5–15.5)
Smear Review: 2
WBC Count: 0.6 10*3/uL — CL (ref 4.0–10.5)
nRBC: 6.6 % — ABNORMAL HIGH (ref 0.0–0.2)

## 2019-09-15 LAB — TSH: TSH: 0.452 u[IU]/mL (ref 0.320–4.118)

## 2019-09-15 MED ORDER — FILGRASTIM-SNDZ 300 MCG/0.5ML IJ SOSY
300.0000 ug | PREFILLED_SYRINGE | Freq: Once | INTRAMUSCULAR | Status: AC
  Administered 2019-09-15: 300 ug via SUBCUTANEOUS

## 2019-09-15 MED ORDER — FLUCONAZOLE 100 MG PO TABS: 100.0000 mg | ORAL_TABLET | Freq: Every day | ORAL | 0 refills | Status: DC

## 2019-09-15 MED ORDER — MAGIC MOUTHWASH: 5.0000 mL | Freq: Four times a day (QID) | ORAL | 0 refills | Status: DC | PRN

## 2019-09-15 MED ORDER — FILGRASTIM-SNDZ 300 MCG/0.5ML IJ SOSY
PREFILLED_SYRINGE | INTRAMUSCULAR | Status: AC
  Filled 2019-09-15: qty 0.5

## 2019-09-15 NOTE — Telephone Encounter (Signed)
Unable to reach patient I phoned his fiance, [name]. I explained a new medication has been sent to their [facility]. I reviewed use of magic mouthwash with [name] and she verbalized understanding. I discussed decadron taper with her as well. [name] verbalized she is presently at the hospital with her daughter and has nothing to write the instructions down on. I called her back and left the instructions for Mr. Baby' decadron taper on her voicemail. Also, I explained on her voicemail and his (when I tried to reach him first) I would be leaving written instructions for taper at the treatment machine for him to pick up tomorrow. I left my direct number on the written instructions and on both the patient and his fiancee's voicemails.

## 2019-09-15 NOTE — Patient Instructions (Signed)
4mg  tablet po once daily x 7 days, then 1/2 tablet daily x 7 days, 1/2 tablet every other day for 5 days and then discontinue.

## 2019-09-15 NOTE — Telephone Encounter (Signed)
As requested phoned in magic mouthwash to [facility]. Spoke with pharmacist who recommended at quantity of 300 cc instead of 400 cc because medication is only good for 14 days and easier to create equal parts of benadryl, maalox and nystatin at that volume. Confirmed with pharmacist this change is approved and requested she provide one refill since quantity had to be reduced. Will phone patient and ensure he is aware the medication will be ready to pick up shortly at [facility].

## 2019-09-15 NOTE — Patient Instructions (Signed)
A prescription for magic mouthwash has been sent to your pharmacy.   Presently, you are taking Decadron 4mg  po BID. Please begin the following steroid taper instructions today:(1) 4mg  tablet po once daily x 7 days, then 1/2 tablet daily x 7 days, 1/2 tablet every other day for 5 days and then discontinue.  If at any point while tapering off the Decadron you feel dizzy, lightheaded, have unrelieved headaches, double vision, tinnitus or an unsteady gait please call your nurse at [phone].

## 2019-09-15 NOTE — Patient Instructions (Signed)

## 2019-09-15 NOTE — Telephone Encounter (Signed)
Phoned patient to review decadron taper instruction. No answer. Left voicemail message with my direct number requesting a return call. Also, will leave taper instructions at treatment machine for patient to pick up tomorrow.

## 2019-09-15 NOTE — Telephone Encounter (Signed)
-----   Message from Freeman Caldron, [place] sent at 09/15/2019  2:13 PM EST ----- Regarding: Need to taper steroids Sam, I got a call from the treatment machine today regarding patient request for Magic mouthwash for thrush. Patient is completing palliative XRT to rectum and thoracic spine and previously had a single fraction of SRS to the brain.  I had the therapists confirm with the patient that he is still taking Decadron 4mg  po BID.  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  Would you please reach out to him today to provide instructions for steroid taper beginning today with taking: (1) 4mg  tablet po once daily x 7 days, then 1/2 tablet daily x 7 days, 1/2 tablet every other day for 5 days and then discontinue. Thank you!! -Ashlyn

## 2019-09-16 ENCOUNTER — Inpatient Hospital Stay: Payer: Managed Care, Other (non HMO)

## 2019-09-16 ENCOUNTER — Other Ambulatory Visit: Payer: Self-pay

## 2019-09-16 ENCOUNTER — Ambulatory Visit
Admission: RE | Admit: 2019-09-16 | Discharge: 2019-09-16 | Disposition: A | Discharge status: Home / Self Care | Payer: Managed Care, Other (non HMO) | Source: Ambulatory Visit | Attending: Radiation Oncology | Admitting: Radiation Oncology

## 2019-09-16 ENCOUNTER — Ambulatory Visit: Payer: Managed Care, Other (non HMO)

## 2019-09-16 VITALS — BP 150/86 | HR 94 | Temp 98.1°F | Resp 18

## 2019-09-16 DIAGNOSIS — D702 Other drug-induced agranulocytosis: Secondary | ICD-10-CM

## 2019-09-16 DIAGNOSIS — Z5112 Encounter for antineoplastic immunotherapy: Secondary | ICD-10-CM | POA: Diagnosis not present

## 2019-09-16 MED ORDER — FILGRASTIM-SNDZ 300 MCG/0.5ML IJ SOSY
300.0000 ug | PREFILLED_SYRINGE | Freq: Once | INTRAMUSCULAR | Status: AC
  Administered 2019-09-16: 300 ug via SUBCUTANEOUS

## 2019-09-16 MED ORDER — FILGRASTIM-SNDZ 300 MCG/0.5ML IJ SOSY
PREFILLED_SYRINGE | INTRAMUSCULAR | Status: AC
  Filled 2019-09-16: qty 0.5

## 2019-09-16 MED ORDER — FILGRASTIM-SNDZ 480 MCG/0.8ML IJ SOSY
PREFILLED_SYRINGE | INTRAMUSCULAR | Status: AC
  Filled 2019-09-16: qty 0.8

## 2019-09-16 NOTE — Patient Instructions (Signed)

## 2019-09-17 ENCOUNTER — Ambulatory Visit
Admission: RE | Admit: 2019-09-17 | Discharge: 2019-09-17 | Disposition: A | Discharge status: Home / Self Care | Payer: Managed Care, Other (non HMO) | Source: Ambulatory Visit | Attending: Radiation Oncology | Admitting: Radiation Oncology

## 2019-09-17 ENCOUNTER — Other Ambulatory Visit (INDEPENDENT_AMBULATORY_CARE_PROVIDER_SITE_OTHER): Payer: Self-pay

## 2019-09-17 ENCOUNTER — Ambulatory Visit: Payer: Managed Care, Other (non HMO)

## 2019-09-17 ENCOUNTER — Telehealth: Payer: Self-pay | Admitting: Medical Oncology

## 2019-09-17 ENCOUNTER — Telehealth: Payer: Self-pay | Admitting: Radiation Oncology

## 2019-09-17 ENCOUNTER — Other Ambulatory Visit: Payer: Self-pay | Admitting: Physician Assistant

## 2019-09-17 ENCOUNTER — Inpatient Hospital Stay: Payer: Managed Care, Other (non HMO)

## 2019-09-17 ENCOUNTER — Other Ambulatory Visit: Payer: Self-pay

## 2019-09-17 ENCOUNTER — Telehealth: Payer: Self-pay | Admitting: *Deleted

## 2019-09-17 VITALS — BP 117/77 | HR 74 | Temp 98.5°F | Resp 18

## 2019-09-17 DIAGNOSIS — C20 Malignant neoplasm of rectum: Secondary | ICD-10-CM

## 2019-09-17 DIAGNOSIS — C3492 Malignant neoplasm of unspecified part of left bronchus or lung: Secondary | ICD-10-CM

## 2019-09-17 DIAGNOSIS — D702 Other drug-induced agranulocytosis: Secondary | ICD-10-CM

## 2019-09-17 DIAGNOSIS — G893 Neoplasm related pain (acute) (chronic): Secondary | ICD-10-CM

## 2019-09-17 DIAGNOSIS — Z5112 Encounter for antineoplastic immunotherapy: Secondary | ICD-10-CM | POA: Diagnosis not present

## 2019-09-17 MED ORDER — METOPROLOL TARTRATE 25 MG PO TABS: 25.0000 mg | ORAL_TABLET | Freq: Two times a day (BID) | ORAL | 3 refills | Status: DC

## 2019-09-17 MED ORDER — FILGRASTIM-SNDZ 300 MCG/0.5ML IJ SOSY
PREFILLED_SYRINGE | INTRAMUSCULAR | Status: AC
  Filled 2019-09-17: qty 0.5

## 2019-09-17 MED ORDER — FILGRASTIM-SNDZ 300 MCG/0.5ML IJ SOSY
300.0000 ug | PREFILLED_SYRINGE | Freq: Once | INTRAMUSCULAR | Status: AC
  Administered 2019-09-17: 300 ug via SUBCUTANEOUS

## 2019-09-17 MED ORDER — OXYCODONE-ACETAMINOPHEN 5-325 MG PO TABS: 1.0000 | ORAL_TABLET | Freq: Four times a day (QID) | ORAL | 0 refills | Status: DC | PRN

## 2019-09-17 MED ORDER — PROAIR RESPICLICK 108 (90 BASE) MCG/ACT IN AEPB: 1.0000 | INHALATION_SPRAY | Freq: Four times a day (QID) | RESPIRATORY_TRACT | 3 refills | Status: DC | PRN

## 2019-09-17 NOTE — Telephone Encounter (Signed)
Received voicemail message from patient's fiance earlier today that a refill of oxycodone-acetaminophen were needed. Reached out to [place], [place] for Dr. Julien Nordmann in medical oncology since she is the initial prescriber. Cassie committed to refilling the medication for the patient. Phoned patient back making him aware of what was being done. Patient verbalized understanding and expressed appreciation for the call.

## 2019-09-17 NOTE — Telephone Encounter (Signed)
Returned his phone call. LVM to call back.

## 2019-09-17 NOTE — Telephone Encounter (Signed)
Patient called to make a medication refill for  metoprolol tartrate (LOPRESSOR) 25 MG tablet   Albuterol Sulfate (PROAIR RESPICLICK) 579 (90 Base) MCG/ACT AEPB   Patient uses  [facility], [place] AT [address]   Please advice [phone]

## 2019-09-17 NOTE — Telephone Encounter (Signed)
Refill has been sent to [facility]

## 2019-09-17 NOTE — Patient Instructions (Signed)

## 2019-09-17 NOTE — Telephone Encounter (Signed)
Jack Lester left a voicemail requesting a refill of pain medication.

## 2019-09-20 ENCOUNTER — Encounter: Payer: Self-pay | Admitting: Radiation Oncology

## 2019-09-20 ENCOUNTER — Ambulatory Visit: Payer: Managed Care, Other (non HMO)

## 2019-09-20 ENCOUNTER — Ambulatory Visit
Admission: RE | Admit: 2019-09-20 | Discharge: 2019-09-20 | Disposition: A | Discharge status: Home / Self Care | Payer: Managed Care, Other (non HMO) | Source: Ambulatory Visit | Attending: Radiation Oncology | Admitting: Radiation Oncology

## 2019-09-20 ENCOUNTER — Other Ambulatory Visit: Payer: Self-pay

## 2019-09-20 DIAGNOSIS — Z5112 Encounter for antineoplastic immunotherapy: Secondary | ICD-10-CM | POA: Diagnosis not present

## 2019-09-22 ENCOUNTER — Inpatient Hospital Stay: Payer: Managed Care, Other (non HMO)

## 2019-09-24 ENCOUNTER — Other Ambulatory Visit: Payer: Self-pay | Admitting: Radiation Therapy

## 2019-09-26 NOTE — Progress Notes (Signed)
  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  Left frontal 6 mm target was treated using 3 Dynamic Conformal Arcs to a prescription dose of 20 Gy.  2.  The thoracic spine and clavicle lesion were treated to 30 Gy in 10 fractions of 3 Gy 3.  The lumbar L4 lesion and rectal tumor were treated to 30 Gy in 10 fractions of 3 Gy  Beams/energy:    1.  Left frontal 6 mm target was treated using 3 Dynamic Conformal Arcs with 6 MV X-rays were delivered in the flattening filter free beam mode. ExacTrac registration was performed for each couch angle.  The 100% isodose line was prescribed. 2.  The thoracic spine and clavicle lesion were treated using anterior and posterior beams 3.  The lumbar L4 lesion and rectal tumor were treated using a 4-field arrangement  Narrative: The patient tolerated radiation treatment relatively well.     Plan: The patient has completed radiation treatment. The patient will return to radiation oncology clinic for routine followup in one month. I advised him to call or return sooner if he has any questions or concerns related to his recovery or treatment. ________________________________  Sheral Apley. Tammi Klippel, M.D.

## 2019-09-27 ENCOUNTER — Other Ambulatory Visit: Payer: Self-pay | Admitting: Internal Medicine

## 2019-09-27 ENCOUNTER — Telehealth: Payer: Self-pay | Admitting: Medical Oncology

## 2019-09-27 DIAGNOSIS — C20 Malignant neoplasm of rectum: Secondary | ICD-10-CM

## 2019-09-27 DIAGNOSIS — G893 Neoplasm related pain (acute) (chronic): Secondary | ICD-10-CM

## 2019-09-27 DIAGNOSIS — C3492 Malignant neoplasm of unspecified part of left bronchus or lung: Secondary | ICD-10-CM

## 2019-09-27 NOTE — Telephone Encounter (Signed)
requests refill.

## 2019-09-28 ENCOUNTER — Inpatient Hospital Stay: Payer: Managed Care, Other (non HMO)

## 2019-09-28 ENCOUNTER — Other Ambulatory Visit: Payer: Self-pay | Admitting: Internal Medicine

## 2019-09-28 DIAGNOSIS — C20 Malignant neoplasm of rectum: Secondary | ICD-10-CM

## 2019-09-28 DIAGNOSIS — C3492 Malignant neoplasm of unspecified part of left bronchus or lung: Secondary | ICD-10-CM

## 2019-09-28 DIAGNOSIS — G893 Neoplasm related pain (acute) (chronic): Secondary | ICD-10-CM

## 2019-09-28 MED ORDER — OXYCODONE-ACETAMINOPHEN 5-325 MG PO TABS: 1.0000 | ORAL_TABLET | Freq: Four times a day (QID) | ORAL | 0 refills | Status: DC | PRN

## 2019-09-29 ENCOUNTER — Telehealth: Payer: Self-pay | Admitting: *Deleted

## 2019-09-29 ENCOUNTER — Inpatient Hospital Stay: Payer: Managed Care, Other (non HMO) | Admitting: Nutrition

## 2019-09-29 ENCOUNTER — Inpatient Hospital Stay: Payer: Managed Care, Other (non HMO) | Admitting: Internal Medicine

## 2019-09-29 ENCOUNTER — Inpatient Hospital Stay: Payer: Managed Care, Other (non HMO)

## 2019-09-29 NOTE — Telephone Encounter (Signed)
Oncology Nurse Navigator Documentation  Oncology Nurse Navigator Flowsheets 09/29/2019  Abnormal Finding Date -  Confirmed Diagnosis Date -  Diagnosis Status -  Planned Course of Treatment -  Phase of Treatment -  Chemotherapy Pending- Reason: -  Chemotherapy Actual Start Date: -  Radiation Pending-Reason: -  Radiation Actual Start Date: -  Radiation Expected End Date: -  Navigator Follow Up Date: -  Navigator Follow Up Reason: -  Navigator Location [id number]  Navigator Encounter Type Telephone/Jack Lester was a no show to his appt with Dr. Julien Nordmann.  I called patient but was unable to reach him but did leave vm message with my name and phone number to call.   Telephone Outgoing Call  [facility] Date -  Multidisiplinary Clinic Type -  Treatment Initiated Date -  Patient Visit Type -  Treatment Phase Treatment  Barriers/Navigation Needs Education  Education Other  Interventions Education  Acuity Level 2-Minimal Needs (1-2 Barriers Identified)  Coordination of Care -  Education Method Verbal  Time Spent with Patient 15

## 2019-09-30 ENCOUNTER — Inpatient Hospital Stay (HOSPITAL_COMMUNITY)
Admission: EM | Admit: 2019-09-30 | Discharge: 2019-11-01 | DRG: 871 | Disposition: E | Payer: Managed Care, Other (non HMO) | Attending: Internal Medicine | Admitting: Internal Medicine

## 2019-09-30 ENCOUNTER — Emergency Department (HOSPITAL_COMMUNITY): Payer: Managed Care, Other (non HMO)

## 2019-09-30 ENCOUNTER — Other Ambulatory Visit: Payer: Self-pay

## 2019-09-30 ENCOUNTER — Encounter (HOSPITAL_COMMUNITY): Payer: Self-pay | Admitting: Emergency Medicine

## 2019-09-30 ENCOUNTER — Inpatient Hospital Stay (HOSPITAL_COMMUNITY): Payer: Managed Care, Other (non HMO)

## 2019-09-30 DIAGNOSIS — N390 Urinary tract infection, site not specified: Secondary | ICD-10-CM | POA: Diagnosis present

## 2019-09-30 DIAGNOSIS — T380X5A Adverse effect of glucocorticoids and synthetic analogues, initial encounter: Secondary | ICD-10-CM | POA: Diagnosis present

## 2019-09-30 DIAGNOSIS — J9622 Acute and chronic respiratory failure with hypercapnia: Secondary | ICD-10-CM | POA: Diagnosis present

## 2019-09-30 DIAGNOSIS — C3492 Malignant neoplasm of unspecified part of left bronchus or lung: Secondary | ICD-10-CM | POA: Diagnosis present

## 2019-09-30 DIAGNOSIS — J158 Pneumonia due to other specified bacteria: Secondary | ICD-10-CM | POA: Diagnosis present

## 2019-09-30 DIAGNOSIS — C7951 Secondary malignant neoplasm of bone: Secondary | ICD-10-CM | POA: Diagnosis present

## 2019-09-30 DIAGNOSIS — Z923 Personal history of irradiation: Secondary | ICD-10-CM | POA: Diagnosis not present

## 2019-09-30 DIAGNOSIS — Z681 Body mass index (BMI) 19 or less, adult: Secondary | ICD-10-CM | POA: Diagnosis not present

## 2019-09-30 DIAGNOSIS — C7931 Secondary malignant neoplasm of brain: Secondary | ICD-10-CM | POA: Diagnosis present

## 2019-09-30 DIAGNOSIS — J9601 Acute respiratory failure with hypoxia: Secondary | ICD-10-CM

## 2019-09-30 DIAGNOSIS — B370 Candidal stomatitis: Secondary | ICD-10-CM | POA: Diagnosis not present

## 2019-09-30 DIAGNOSIS — E872 Acidosis: Secondary | ICD-10-CM | POA: Diagnosis present

## 2019-09-30 DIAGNOSIS — E43 Unspecified severe protein-calorie malnutrition: Secondary | ICD-10-CM | POA: Diagnosis present

## 2019-09-30 DIAGNOSIS — Z7189 Other specified counseling: Secondary | ICD-10-CM | POA: Diagnosis not present

## 2019-09-30 DIAGNOSIS — Z66 Do not resuscitate: Secondary | ICD-10-CM | POA: Diagnosis present

## 2019-09-30 DIAGNOSIS — B9689 Other specified bacterial agents as the cause of diseases classified elsewhere: Secondary | ICD-10-CM | POA: Diagnosis present

## 2019-09-30 DIAGNOSIS — C3411 Malignant neoplasm of upper lobe, right bronchus or lung: Secondary | ICD-10-CM | POA: Diagnosis not present

## 2019-09-30 DIAGNOSIS — Z9981 Dependence on supplemental oxygen: Secondary | ICD-10-CM

## 2019-09-30 DIAGNOSIS — L89152 Pressure ulcer of sacral region, stage 2: Secondary | ICD-10-CM | POA: Diagnosis present

## 2019-09-30 DIAGNOSIS — I10 Essential (primary) hypertension: Secondary | ICD-10-CM | POA: Diagnosis present

## 2019-09-30 DIAGNOSIS — C20 Malignant neoplasm of rectum: Secondary | ICD-10-CM | POA: Diagnosis present

## 2019-09-30 DIAGNOSIS — Z9221 Personal history of antineoplastic chemotherapy: Secondary | ICD-10-CM

## 2019-09-30 DIAGNOSIS — Z981 Arthrodesis status: Secondary | ICD-10-CM

## 2019-09-30 DIAGNOSIS — J9621 Acute and chronic respiratory failure with hypoxia: Secondary | ICD-10-CM | POA: Diagnosis present

## 2019-09-30 DIAGNOSIS — J189 Pneumonia, unspecified organism: Secondary | ICD-10-CM

## 2019-09-30 DIAGNOSIS — E871 Hypo-osmolality and hyponatremia: Secondary | ICD-10-CM | POA: Diagnosis present

## 2019-09-30 DIAGNOSIS — J9690 Respiratory failure, unspecified, unspecified whether with hypoxia or hypercapnia: Secondary | ICD-10-CM

## 2019-09-30 DIAGNOSIS — R739 Hyperglycemia, unspecified: Secondary | ICD-10-CM | POA: Diagnosis present

## 2019-09-30 DIAGNOSIS — R627 Adult failure to thrive: Secondary | ICD-10-CM | POA: Diagnosis present

## 2019-09-30 DIAGNOSIS — J440 Chronic obstructive pulmonary disease with acute lower respiratory infection: Secondary | ICD-10-CM | POA: Diagnosis present

## 2019-09-30 DIAGNOSIS — D638 Anemia in other chronic diseases classified elsewhere: Secondary | ICD-10-CM | POA: Diagnosis present

## 2019-09-30 DIAGNOSIS — Z515 Encounter for palliative care: Secondary | ICD-10-CM | POA: Diagnosis not present

## 2019-09-30 DIAGNOSIS — E876 Hypokalemia: Secondary | ICD-10-CM | POA: Diagnosis not present

## 2019-09-30 DIAGNOSIS — L8990 Pressure ulcer of unspecified site, unspecified stage: Secondary | ICD-10-CM | POA: Insufficient documentation

## 2019-09-30 DIAGNOSIS — G9341 Metabolic encephalopathy: Secondary | ICD-10-CM | POA: Diagnosis present

## 2019-09-30 DIAGNOSIS — J15211 Pneumonia due to Methicillin susceptible Staphylococcus aureus: Secondary | ICD-10-CM | POA: Diagnosis present

## 2019-09-30 DIAGNOSIS — N170 Acute kidney failure with tubular necrosis: Secondary | ICD-10-CM | POA: Diagnosis present

## 2019-09-30 DIAGNOSIS — Z7951 Long term (current) use of inhaled steroids: Secondary | ICD-10-CM

## 2019-09-30 DIAGNOSIS — Z20822 Contact with and (suspected) exposure to covid-19: Secondary | ICD-10-CM | POA: Diagnosis present

## 2019-09-30 DIAGNOSIS — F17210 Nicotine dependence, cigarettes, uncomplicated: Secondary | ICD-10-CM | POA: Diagnosis present

## 2019-09-30 DIAGNOSIS — A4101 Sepsis due to Methicillin susceptible Staphylococcus aureus: Secondary | ICD-10-CM | POA: Diagnosis present

## 2019-09-30 DIAGNOSIS — E162 Hypoglycemia, unspecified: Secondary | ICD-10-CM | POA: Diagnosis not present

## 2019-09-30 LAB — COMPREHENSIVE METABOLIC PANEL
ALT: 18 U/L (ref 0–44)
ALT: 50 U/L — ABNORMAL HIGH (ref 0–44)
AST: 27 U/L (ref 15–41)
AST: 111 U/L — ABNORMAL HIGH (ref 15–41)
Albumin: 2.1 g/dL — ABNORMAL LOW (ref 3.5–5.0)
Albumin: 1.6 g/dL — ABNORMAL LOW (ref 3.5–5.0)
Alkaline Phosphatase: 143 U/L — ABNORMAL HIGH (ref 38–126)
Alkaline Phosphatase: 108 U/L (ref 38–126)
Anion gap: 14 (ref 5–15)
Anion gap: 14 (ref 5–15)
BUN: 23 mg/dL — ABNORMAL HIGH (ref 6–20)
BUN: 29 mg/dL — ABNORMAL HIGH (ref 6–20)
CO2: 27 mmol/L (ref 22–32)
CO2: 24 mmol/L (ref 22–32)
Calcium: 8.6 mg/dL — ABNORMAL LOW (ref 8.9–10.3)
Calcium: 7.9 mg/dL — ABNORMAL LOW (ref 8.9–10.3)
Chloride: 88 mmol/L — ABNORMAL LOW (ref 98–111)
Chloride: 88 mmol/L — ABNORMAL LOW (ref 98–111)
Creatinine, Ser: 0.84 mg/dL (ref 0.61–1.24)
Creatinine, Ser: 0.82 mg/dL (ref 0.61–1.24)
GFR calc Af Amer: >60 mL/min (ref >60)
GFR calc Af Amer: >60 mL/min (ref >60)
GFR calc non Af Amer: >60 mL/min (ref >60)
GFR calc non Af Amer: >60 mL/min (ref >60)
Glucose, Bld: 93 mg/dL (ref 70–99)
Glucose, Bld: 123 mg/dL — ABNORMAL HIGH (ref 70–99)
Potassium: 4.8 mmol/L (ref 3.5–5.1)
Potassium: 4.6 mmol/L (ref 3.5–5.1)
Sodium: 129 mmol/L — ABNORMAL LOW (ref 135–145)
Sodium: 126 mmol/L — ABNORMAL LOW (ref 135–145)
Total Bilirubin: 0.5 mg/dL (ref 0.3–1.2)
Total Bilirubin: 0.9 mg/dL (ref 0.3–1.2)
Total Protein: 7.3 g/dL (ref 6.5–8.1)
Total Protein: 6 g/dL — ABNORMAL LOW (ref 6.5–8.1)

## 2019-09-30 LAB — PROTIME-INR
INR: 1.1 (ref 0.8–1.2)
Prothrombin Time: 14.3 seconds (ref 11.4–15.2)

## 2019-09-30 LAB — CBC WITH DIFFERENTIAL/PLATELET
Abs Immature Granulocytes: 0.9 10*3/uL — ABNORMAL HIGH (ref 0.00–0.07)
Basophils Absolute: 0 10*3/uL (ref 0.0–0.1)
Basophils Relative: 0 %
Eosinophils Absolute: 0 10*3/uL (ref 0.0–0.5)
Eosinophils Relative: 0 %
HCT: 32.7 % — ABNORMAL LOW (ref 39.0–52.0)
Hemoglobin: 11 g/dL — ABNORMAL LOW (ref 13.0–17.0)
Immature Granulocytes: 4 %
Lymphocytes Relative: 2 %
Lymphs Abs: 0.4 10*3/uL — ABNORMAL LOW (ref 0.7–4.0)
MCH: 33.1 pg (ref 26.0–34.0)
MCHC: 33.6 g/dL (ref 30.0–36.0)
MCV: 98.5 fL (ref 80.0–100.0)
Monocytes Absolute: 0.4 10*3/uL (ref 0.1–1.0)
Monocytes Relative: 2 %
Neutro Abs: 19.7 10*3/uL — ABNORMAL HIGH (ref 1.7–7.7)
Neutrophils Relative %: 92 %
Platelets: 290 10*3/uL (ref 150–400)
RBC: 3.32 MIL/uL — ABNORMAL LOW (ref 4.22–5.81)
RDW: 14.8 % (ref 11.5–15.5)
WBC: 21.4 10*3/uL — ABNORMAL HIGH (ref 4.0–10.5)
nRBC: 0 % (ref 0.0–0.2)

## 2019-09-30 LAB — RESPIRATORY PANEL BY RT PCR (FLU A&B, COVID)
Influenza A by PCR: NEGATIVE
Influenza B by PCR: NEGATIVE
SARS Coronavirus 2 by RT PCR: NEGATIVE

## 2019-09-30 LAB — BLOOD GAS, ARTERIAL
Acid-Base Excess: 1 mmol/L (ref 0.0–2.0)
Acid-Base Excess: 2.8 mmol/L — ABNORMAL HIGH (ref 0.0–2.0)
Acid-base deficit: 1.9 mmol/L (ref 0.0–2.0)
Bicarbonate: 30.5 mmol/L — ABNORMAL HIGH (ref 20.0–28.0)
Bicarbonate: 25.4 mmol/L (ref 20.0–28.0)
Bicarbonate: 25.7 mmol/L (ref 20.0–28.0)
FIO2: 80
O2 Saturation: 81.3 %
O2 Saturation: 98.4 %
O2 Saturation: 96.9 %
Patient temperature: 98.5
Patient temperature: 98.6
Patient temperature: 98.6
pCO2 arterial: 90.1 mmHg (ref 32.0–48.0)
pCO2 arterial: 41.7 mmHg (ref 32.0–48.0)
pCO2 arterial: 34.6 mmHg (ref 32.0–48.0)
pH, Arterial: 7.155 — CL (ref 7.350–7.450)
pH, Arterial: 7.401 (ref 7.350–7.450)
pH, Arterial: 7.483 — ABNORMAL HIGH (ref 7.350–7.450)
pO2, Arterial: 65.7 mmHg — ABNORMAL LOW (ref 83.0–108.0)
pO2, Arterial: 156 mmHg — ABNORMAL HIGH (ref 83.0–108.0)
pO2, Arterial: 92.3 mmHg (ref 83.0–108.0)

## 2019-09-30 LAB — CBC
HCT: 38.3 % — ABNORMAL LOW (ref 39.0–52.0)
Hemoglobin: 12.5 g/dL — ABNORMAL LOW (ref 13.0–17.0)
MCH: 33.2 pg (ref 26.0–34.0)
MCHC: 32.6 g/dL (ref 30.0–36.0)
MCV: 101.9 fL — ABNORMAL HIGH (ref 80.0–100.0)
Platelets: 332 10*3/uL (ref 150–400)
RBC: 3.76 MIL/uL — ABNORMAL LOW (ref 4.22–5.81)
RDW: 14.8 % (ref 11.5–15.5)
WBC: 31.5 10*3/uL — ABNORMAL HIGH (ref 4.0–10.5)
nRBC: 0.1 % (ref 0.0–0.2)

## 2019-09-30 LAB — LIPASE, BLOOD: Lipase: 13 U/L (ref 11–51)

## 2019-09-30 LAB — RESPIRATORY PANEL BY PCR

## 2019-09-30 LAB — PHOSPHORUS: Phosphorus: 5.6 mg/dL — ABNORMAL HIGH (ref 2.5–4.6)

## 2019-09-30 LAB — CORTISOL: Cortisol, Plasma: 73.7 ug/dL

## 2019-09-30 LAB — STREP PNEUMONIAE URINARY ANTIGEN: Strep Pneumo Urinary Antigen: NEGATIVE

## 2019-09-30 LAB — GLUCOSE, CAPILLARY: Glucose-Capillary: 90 mg/dL (ref 70–99)

## 2019-09-30 LAB — AMYLASE: Amylase: 187 U/L — ABNORMAL HIGH (ref 28–100)

## 2019-09-30 LAB — MRSA PCR SCREENING: MRSA by PCR: NEGATIVE

## 2019-09-30 LAB — BRAIN NATRIURETIC PEPTIDE: B Natriuretic Peptide: 345.1 pg/mL — ABNORMAL HIGH (ref 0.0–100.0)

## 2019-09-30 LAB — LACTIC ACID, PLASMA: Lactic Acid, Venous: 3 mmol/L (ref 0.5–1.9)

## 2019-09-30 LAB — TRIGLYCERIDES: Triglycerides: 74 mg/dL (ref <150)

## 2019-09-30 LAB — PROCALCITONIN: Procalcitonin: 33.8 ng/mL

## 2019-09-30 LAB — MAGNESIUM: Magnesium: 2.1 mg/dL (ref 1.7–2.4)

## 2019-09-30 MED ORDER — METHYLPREDNISOLONE SODIUM SUCC 125 MG IJ SOLR
125.0000 mg | Freq: Once | INTRAMUSCULAR | Status: AC
  Administered 2019-09-30: 125 mg via INTRAVENOUS
  Filled 2019-09-30: qty 2

## 2019-09-30 MED ORDER — VANCOMYCIN HCL IN DEXTROSE 1-5 GM/200ML-% IV SOLN
1000.0000 mg | INTRAVENOUS | Status: DC
  Filled 2019-09-30: qty 200

## 2019-09-30 MED ORDER — SODIUM CHLORIDE 0.9 % IV SOLN
2.0000 g | Freq: Three times a day (TID) | INTRAVENOUS | Status: DC
  Administered 2019-10-01 (×3): 2 g via INTRAVENOUS
  Filled 2019-09-30 (×3): qty 2

## 2019-09-30 MED ORDER — CHLORHEXIDINE GLUCONATE 0.12% ORAL RINSE (MEDLINE KIT)
15.0000 mL | Freq: Two times a day (BID) | OROMUCOSAL | Status: DC
  Administered 2019-09-30 – 2019-10-03 (×6): 15 mL via OROMUCOSAL

## 2019-09-30 MED ORDER — PROPOFOL 1000 MG/100ML IV EMUL
0.0000 ug/kg/min | INTRAVENOUS | Status: DC
  Administered 2019-09-30: 5 ug/kg/min via INTRAVENOUS
  Filled 2019-09-30 (×2): qty 100

## 2019-09-30 MED ORDER — VANCOMYCIN HCL IN DEXTROSE 1-5 GM/200ML-% IV SOLN
1000.0000 mg | Freq: Once | INTRAVENOUS | Status: AC
  Administered 2019-09-30: 1000 mg via INTRAVENOUS
  Filled 2019-09-30: qty 200

## 2019-09-30 MED ORDER — FENTANYL CITRATE (PF) 100 MCG/2ML IJ SOLN
50.0000 ug | Freq: Once | INTRAMUSCULAR | Status: AC
  Administered 2019-09-30: 50 ug via INTRAVENOUS
  Filled 2019-09-30: qty 2

## 2019-09-30 MED ORDER — METHYLPREDNISOLONE SODIUM SUCC 125 MG IJ SOLR
60.0000 mg | Freq: Three times a day (TID) | INTRAMUSCULAR | Status: DC
  Administered 2019-09-30 – 2019-10-01 (×2): 60 mg via INTRAVENOUS
  Filled 2019-09-30 (×2): qty 2

## 2019-09-30 MED ORDER — ACETAMINOPHEN 10 MG/ML IV SOLN
1000.0000 mg | Freq: Once | INTRAVENOUS | Status: AC
  Administered 2019-09-30: 1000 mg via INTRAVENOUS
  Filled 2019-09-30: qty 100

## 2019-09-30 MED ORDER — IPRATROPIUM BROMIDE HFA 17 MCG/ACT IN AERS
2.0000 | INHALATION_SPRAY | Freq: Once | RESPIRATORY_TRACT | Status: DC
  Filled 2019-09-30: qty 12.9

## 2019-09-30 MED ORDER — SODIUM CHLORIDE 0.9 % IV SOLN
1.0000 g | Freq: Once | INTRAVENOUS | Status: AC
  Administered 2019-09-30: 1 g via INTRAVENOUS
  Filled 2019-09-30: qty 1

## 2019-09-30 MED ORDER — ORAL CARE MOUTH RINSE
15.0000 mL | OROMUCOSAL | Status: DC
  Administered 2019-09-30 – 2019-10-03 (×24): 15 mL via OROMUCOSAL

## 2019-09-30 MED ORDER — FENTANYL BOLUS VIA INFUSION
50.0000 ug | INTRAVENOUS | Status: DC | PRN
  Administered 2019-09-30 (×2): 50 ug via INTRAVENOUS
  Filled 2019-09-30: qty 50

## 2019-09-30 MED ORDER — ALBUTEROL SULFATE HFA 108 (90 BASE) MCG/ACT IN AERS
4.0000 | INHALATION_SPRAY | Freq: Once | RESPIRATORY_TRACT | Status: DC
  Filled 2019-09-30: qty 6.7

## 2019-09-30 MED ORDER — HEPARIN SODIUM (PORCINE) 5000 UNIT/ML IJ SOLN
5000.0000 [IU] | Freq: Three times a day (TID) | INTRAMUSCULAR | Status: DC
  Administered 2019-09-30 – 2019-10-06 (×15): 5000 [IU] via SUBCUTANEOUS
  Filled 2019-09-30 (×15): qty 1

## 2019-09-30 MED ORDER — FENTANYL 2500MCG IN NS 250ML (10MCG/ML) PREMIX INFUSION
25.0000 ug/h | INTRAVENOUS | Status: DC
  Administered 2019-09-30: 50 ug/h via INTRAVENOUS
  Filled 2019-09-30: qty 250

## 2019-09-30 MED ORDER — PANTOPRAZOLE SODIUM 40 MG IV SOLR
40.0000 mg | Freq: Every day | INTRAVENOUS | Status: DC
  Administered 2019-09-30 – 2019-10-01 (×2): 40 mg via INTRAVENOUS
  Filled 2019-09-30 (×2): qty 40

## 2019-09-30 MED ORDER — IPRATROPIUM-ALBUTEROL 0.5-2.5 (3) MG/3ML IN SOLN
3.0000 mL | RESPIRATORY_TRACT | Status: DC
  Administered 2019-09-30 – 2019-10-02 (×10): 3 mL via RESPIRATORY_TRACT
  Filled 2019-09-30 (×10): qty 3

## 2019-09-30 MED ORDER — CHLORHEXIDINE GLUCONATE CLOTH 2 % EX PADS
6.0000 | MEDICATED_PAD | Freq: Every day | CUTANEOUS | Status: DC
  Administered 2019-09-30 – 2019-10-06 (×7): 6 via TOPICAL

## 2019-09-30 NOTE — ED Notes (Signed)
Efforts to intubate begin at 1516. Identification was made. 20mg  of Etomidate was given at 1517. 100 mg of succinate was given at 1518. Tube was placed at 1518. The patient was the placed on the ventilator at about 1522. OG tube and foley were then placed.

## 2019-09-30 NOTE — ED Notes (Signed)
Date and time results received: 09/05/2019 n (use smartphrase ".now" to insert current time)  Test: [name] Critical Value: pH 7.155 CO2 90.1  Name of Provider Notified: Tomi Bamberger  Orders Received? Or Actions Taken?: Orders Received - See Orders for details

## 2019-09-30 NOTE — ED Triage Notes (Signed)
The patient is from home and presents with complaints of SOB which has worsened over the past couple of days. He was able to ambulate for EMS. Hx lung cancer, chronically on 3L of O2.   EMS vitals: 101.5 Temp 140/90 BP 88 HR 24 RR

## 2019-09-30 NOTE — Progress Notes (Signed)
A consult was received from an ED physician for vancomycin per pharmacy dosing.  The patient's profile has been reviewed for ht/wt/allergies/indication/available labs.    A one time order has been placed for vancomycin 1000 mg IV x1.  Further antibiotics/pharmacy consults should be ordered by admitting physician if indicated.                       Thank you, Lynelle Doctor 09/06/2019  3:32 PM

## 2019-09-30 NOTE — Progress Notes (Signed)
Notified Lab [name] sent for analysis. This [name] was drawn on  VT 560 RR 25 Fi02 100% PEEP 5  This is the first post intubation [name].

## 2019-09-30 NOTE — Progress Notes (Signed)
eLink Physician-Brief Progress Note Patient Name: Jack Lester DOB: 02/05/1961 MRN: [id number]   Date of Service  09/20/2019  HPI/Events of Note  Fever  eICU Interventions  iv Tylenol 1000 mg x 1        [name] U Maceo Hernan 09/15/2019, 11:21 PM

## 2019-09-30 NOTE — Progress Notes (Signed)
[facility] Progress Note Patient Name: Jack Lester DOB: 03/06/1961 MRN: [id number]   Date of Service  09/15/2019  HPI/Events of Note  ET Tube 5.5 cm above carina  eICU Interventions  RT instructed to advance the ET Tube  2 cm.        [name] Danine Hor 09/28/2019, 11:40 PM

## 2019-09-30 NOTE — Progress Notes (Signed)
eLink Physician-Brief Progress Note Patient Name: Jack Lester DOB: 1961/05/13 MRN: [id number]   Date of Service  09/04/2019  HPI/Events of Note  Pt admitted with acute on chronic hypoxemic respiratory failure secondary to advanced COPD. Pt also has stage 4 lung and rectal cancer. Pt was intubated and is on the ventilator.  eICU Interventions  New Patient Evaluation completed.        Frederik Pear 10/03/2019, 9:31 PM

## 2019-09-30 NOTE — Progress Notes (Signed)
[name] drawn on the following vent settings: PRVC mode, VT=553ml, rr25, 80%, +5peep.   Results for Jack Lester, Jack Lester (MRN [id number]) as of 09/28/2019 23:11  Ref. Range 09/22/2019 21:00  FIO2 Unknown 80.00  pH, Arterial Latest Ref Range: 7.350 - 7.450  7.483 (H)  pCO2 arterial Latest Ref Range: 32.0 - 48.0 mmHg 34.6  pO2, Arterial Latest Ref Range: 83.0 - 108.0 mmHg 92.3  Acid-Base Excess Latest Ref Range: 0.0 - 2.0 mmol/L 2.8 (H)  Bicarbonate Latest Ref Range: 20.0 - 28.0 mmol/L 25.7  O2 Saturation Latest Units: % 96.9  Patient temperature Unknown 98.6  Allens test (pass/fail) Latest Ref Range: PASS  PASS
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
## 2019-09-30 NOTE — Progress Notes (Signed)
Notified Lab that [name] being sent for analysis. This [name] was drawn on 100% non rebreather. Sp02 on 100% NRB <88% (questionable waveform). Placed PT on HFNC at 15 LPM under NRB. RN aware.

## 2019-09-30 NOTE — Progress Notes (Signed)
Pt transported from ED to CT scan and then to ICU room 1228.  Pt remained stable and comfortable throughout trip.

## 2019-09-30 NOTE — Progress Notes (Signed)
Pharmacy Antibiotic Note  Jack Lester is a 59 y.o. male with hx COPD and lung/rectal cancer presented to the ED on 09/22/2019 with c/o SOB.  He was subsequently intubated in the ED.  Pharmacy was consulted to dose vancomycin and cefepime for PNA.  Plan: - cefepime 2gm IV q8h - vancomycin 1000mg  IV q24h for est AUC 452 ________________________________  Weight: 110 lb (49.9 kg)  Temp (24hrs), Avg:98.3 F (36.8 C), Min:98.3 F (36.8 C), Max:98.3 F (36.8 C)  Recent Labs  Lab 10/02/2019 1509  WBC 31.5*  CREATININE 0.84    Estimated Creatinine Clearance: 68.5 mL/min (by C-G formula based on SCr of 0.84 mg/dL).    No Known Allergies   Thank you for allowing pharmacy to be a part of this patient's care.  Lynelle Doctor 09/08/2019 5:58 PM
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
## 2019-09-30 NOTE — Progress Notes (Signed)
Per Elink MD, ett advanced 2cm from 26cm lip to 28cm lip.  Pt tolerated well without incident.  Equal breath sounds noted.

## 2019-09-30 NOTE — ED Notes (Signed)
Pt will be transported to CT then ICU after 1900. Report has been called. Day shift ICU will give report to night shift ICU

## 2019-10-01 ENCOUNTER — Inpatient Hospital Stay (HOSPITAL_COMMUNITY): Payer: Managed Care, Other (non HMO)

## 2019-10-01 LAB — BLOOD GAS, ARTERIAL
Acid-Base Excess: 0.9 mmol/L (ref 0.0–2.0)
Bicarbonate: 23.7 mmol/L (ref 20.0–28.0)
FIO2: 80
O2 Saturation: 98.7 %
Patient temperature: 98.6
pCO2 arterial: 32.2 mmHg (ref 32.0–48.0)
pH, Arterial: 7.48 — ABNORMAL HIGH (ref 7.350–7.450)
pO2, Arterial: 147 mmHg — ABNORMAL HIGH (ref 83.0–108.0)

## 2019-10-01 LAB — TSH: TSH: 0.72 u[IU]/mL (ref 0.350–4.500)

## 2019-10-01 LAB — CBC
HCT: 25.7 % — ABNORMAL LOW (ref 39.0–52.0)
Hemoglobin: 9 g/dL — ABNORMAL LOW (ref 13.0–17.0)
MCH: 34.4 pg — ABNORMAL HIGH (ref 26.0–34.0)
MCHC: 35 g/dL (ref 30.0–36.0)
MCV: 98.1 fL (ref 80.0–100.0)
Platelets: 227 10*3/uL (ref 150–400)
RBC: 2.62 MIL/uL — ABNORMAL LOW (ref 4.22–5.81)
RDW: 14.8 % (ref 11.5–15.5)
WBC: 14.5 10*3/uL — ABNORMAL HIGH (ref 4.0–10.5)
nRBC: 0.2 % (ref 0.0–0.2)

## 2019-10-01 LAB — GLUCOSE, CAPILLARY
Glucose-Capillary: 103 mg/dL — ABNORMAL HIGH (ref 70–99)
Glucose-Capillary: 102 mg/dL — ABNORMAL HIGH (ref 70–99)
Glucose-Capillary: 102 mg/dL — ABNORMAL HIGH (ref 70–99)

## 2019-10-01 LAB — TROPONIN I (HIGH SENSITIVITY): Troponin I (High Sensitivity): 11 ng/L (ref <18)

## 2019-10-01 LAB — BASIC METABOLIC PANEL
Anion gap: 15 (ref 5–15)
BUN: 37 mg/dL — ABNORMAL HIGH (ref 6–20)
CO2: 23 mmol/L (ref 22–32)
Calcium: 7.7 mg/dL — ABNORMAL LOW (ref 8.9–10.3)
Chloride: 90 mmol/L — ABNORMAL LOW (ref 98–111)
Creatinine, Ser: 1.14 mg/dL (ref 0.61–1.24)
GFR calc Af Amer: >60 mL/min (ref >60)
GFR calc non Af Amer: >60 mL/min (ref >60)
Glucose, Bld: 89 mg/dL (ref 70–99)
Potassium: 4.5 mmol/L (ref 3.5–5.1)
Sodium: 128 mmol/L — ABNORMAL LOW (ref 135–145)

## 2019-10-01 LAB — SODIUM, URINE, RANDOM: Sodium, Ur: <10 mmol/L

## 2019-10-01 LAB — PHOSPHORUS
Phosphorus: 5.8 mg/dL — ABNORMAL HIGH (ref 2.5–4.6)
Phosphorus: 6 mg/dL — ABNORMAL HIGH (ref 2.5–4.6)
Phosphorus: 6 mg/dL — ABNORMAL HIGH (ref 2.5–4.6)

## 2019-10-01 LAB — MAGNESIUM
Magnesium: 2.1 mg/dL (ref 1.7–2.4)
Magnesium: 2.1 mg/dL (ref 1.7–2.4)
Magnesium: 2.3 mg/dL (ref 1.7–2.4)

## 2019-10-01 LAB — OSMOLALITY, URINE: Osmolality, Ur: 394 mOsm/kg (ref 300–900)

## 2019-10-01 MED ORDER — DEXMEDETOMIDINE HCL IN NACL 200 MCG/50ML IV SOLN: 0.4000 ug/kg/h | INTRAVENOUS | Status: DC

## 2019-10-01 MED ORDER — VITAL HIGH PROTEIN PO LIQD
1000.0000 mL | ORAL | Status: DC
  Administered 2019-10-01 – 2019-10-02 (×2): 1000 mL

## 2019-10-01 MED ORDER — FENTANYL CITRATE (PF) 100 MCG/2ML IJ SOLN
50.0000 ug | INTRAMUSCULAR | Status: DC | PRN
  Administered 2019-10-01 – 2019-10-02 (×7): 100 ug via INTRAVENOUS
  Administered 2019-10-02: 150 ug via INTRAVENOUS
  Administered 2019-10-02: 100 ug via INTRAVENOUS
  Administered 2019-10-03 (×2): 200 ug via INTRAVENOUS
  Filled 2019-10-01: qty 2
  Filled 2019-10-01 (×2): qty 4
  Filled 2019-10-01: qty 2
  Filled 2019-10-01 (×3): qty 4
  Filled 2019-10-01 (×3): qty 2

## 2019-10-01 MED ORDER — DEXMEDETOMIDINE HCL IN NACL 200 MCG/50ML IV SOLN
0.0000 ug/kg/h | INTRAVENOUS | Status: DC
  Administered 2019-10-01: 0.7 ug/kg/h via INTRAVENOUS
  Administered 2019-10-01: 0.4 ug/kg/h via INTRAVENOUS
  Administered 2019-10-02: 0.8 ug/kg/h via INTRAVENOUS
  Administered 2019-10-02: 1.2 ug/kg/h via INTRAVENOUS
  Administered 2019-10-02: 0.9 ug/kg/h via INTRAVENOUS
  Administered 2019-10-02: 1.1 ug/kg/h via INTRAVENOUS
  Administered 2019-10-02: 0.9 ug/kg/h via INTRAVENOUS
  Administered 2019-10-03 (×2): 1.2 ug/kg/h via INTRAVENOUS
  Filled 2019-10-01 (×9): qty 50

## 2019-10-01 MED ORDER — PRO-STAT SUGAR FREE PO LIQD
30.0000 mL | Freq: Two times a day (BID) | ORAL | Status: DC
  Administered 2019-10-01 – 2019-10-02 (×4): 30 mL
  Filled 2019-10-01 (×4): qty 30

## 2019-10-01 MED ORDER — METHYLPREDNISOLONE SODIUM SUCC 40 MG IJ SOLR
40.0000 mg | Freq: Two times a day (BID) | INTRAMUSCULAR | Status: DC
  Administered 2019-10-01 – 2019-10-02 (×2): 40 mg via INTRAVENOUS
  Filled 2019-10-01 (×2): qty 1

## 2019-10-01 MED ORDER — SODIUM CHLORIDE 0.9 % IV SOLN
2.0000 g | Freq: Two times a day (BID) | INTRAVENOUS | Status: DC
  Administered 2019-10-02 – 2019-10-03 (×3): 2 g via INTRAVENOUS
  Filled 2019-10-01 (×3): qty 2

## 2019-10-01 MED ORDER — VANCOMYCIN HCL 750 MG/150ML IV SOLN
750.0000 mg | INTRAVENOUS | Status: DC
  Administered 2019-10-01: 750 mg via INTRAVENOUS
  Filled 2019-10-01: qty 150

## 2019-10-01 MED ORDER — FENTANYL CITRATE (PF) 100 MCG/2ML IJ SOLN: 50.0000 ug | INTRAMUSCULAR | Status: DC | PRN

## 2019-10-01 MED ORDER — ALBUMIN HUMAN 5 % IV SOLN
25.0000 g | Freq: Once | INTRAVENOUS | Status: AC
  Administered 2019-10-01: 01:00:00 25 g via INTRAVENOUS
  Filled 2019-10-01: qty 250

## 2019-10-01 NOTE — Progress Notes (Signed)
eLink Physician-Brief Progress Note Patient Name: Jack Lester DOB: May 10, 1961 MRN: [id number]   Date of Service  10/01/2019  HPI/Events of Note  Pt needs order for foley catheter  eICU Interventions  Order for foley catheter entered        Frederik Pear 10/01/2019, 2:36 AM

## 2019-10-01 NOTE — Progress Notes (Signed)
Patient arrived to unit in restraints. No order was found. Restraints were removed. Patient was accessed for the need of restraints. Safety mitten were applied instead.
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
## 2019-10-01 NOTE — Progress Notes (Signed)
Patient visibly agitated, breathing over vent, O2 sats 85%, waving hands, alert and trying to mouth words around ETT.   No sedation on at this time/no continuous ordered, PRN Fent 117mcg given per order.   Patient immediately looked relaxed after PRN given, following commands and nodding appropriately.    Placed mittens back on patient for safety, explained importance of breathing tube to remain in place at this time until patient meets criteria to remove, nodded appropriately in agreement to not remove tube.

## 2019-10-01 NOTE — Progress Notes (Signed)
[name] was drawn on the following vent settings:  PRVC mode, VT=576ml, rr=25, 80%, +5peep  Results for Jack Lester, Jack Lester (MRN [id number]) as of 10/01/2019 03:35  Ref. Range 10/01/2019 03:18  FIO2 Unknown 80.00  pH, Arterial Latest Ref Range: 7.350 - 7.450  7.480 (H)  pCO2 arterial Latest Ref Range: 32.0 - 48.0 mmHg 32.2  pO2, Arterial Latest Ref Range: 83.0 - 108.0 mmHg 147 (H)  Acid-Base Excess Latest Ref Range: 0.0 - 2.0 mmol/L 0.9  Bicarbonate Latest Ref Range: 20.0 - 28.0 mmol/L 23.7  O2 Saturation Latest Units: % 98.7  Patient temperature Unknown 98.6  Allens test (pass/fail) Latest Ref Range: PASS  PASS

## 2019-10-01 NOTE — Progress Notes (Signed)
[facility] Progress Note Patient Name: Jack Lester DOB: 24-Jun-1961 MRN: [id number]   Date of Service  10/01/2019  HPI/Events of Note  Serum lactate 3.0, Albumin 1.6, BP soft.  eICU Interventions  Albumin 5 % 500 ml iv bolus x 1        [name] U [name] 10/01/2019, 12:15 AM

## 2019-10-01 NOTE — Progress Notes (Signed)
Pharmacy Antibiotic Note  Jack Lester is a 59 y.o. male admitted on 10/03/2019 with pneumonia.  Pharmacy has been consulted for vancomycin and cefepime dosing.  Pt has PMH significant for COPD, FTT, rectal and lung cancer with brain mets. Broad spectrum antibiotics initiated on admission.   Today, 10/01/19 -WBC 14.5, decreasing -SCr 1.14, increased. CrCl ~ 44 mL/min -PCT 33.8 on admission -Tmax 100.8 F  Plan:  Decrease cefepime to 2 g IV q12h  Decrease vancomycin to 750 mg IV q24h  Goal vancomycin AUC 400-550  Monitor renal function closely  Height: 5\' 9"  (175.3 cm) Weight: 95 lb 7.4 oz (43.3 kg) IBW/kg (Calculated) : 70.7  Temp (24hrs), Avg:99.8 F (37.7 C), Min:98.6 F (37 C), Max:100.8 F (38.2 C)  Recent Labs  Lab 09/27/2019 1509 09/13/2019 1830 10/01/19 0243  WBC 31.5* 21.4* 14.5*  CREATININE 0.84 0.82 1.14  LATICACIDVEN  --  3.0*  --     Estimated Creatinine Clearance: 43.8 mL/min (by C-G formula based on SCr of 1.14 mg/dL).    No Known Allergies  Antimicrobials this admission: vancomycin 1/28 >>  cefepime 1/28 >>   Dose adjustments this admission: 1/29: cefepime 2 g q8h --> 2 g q12h 1/29: vanc 1 g q24h --> 750 mg q24h  Microbiology results: 1/28 BCx: ngtd 1/28 UCx: Sent  1/28 Sputum: Pending  1/28 MRSA PCR: Negative  Thank you for allowing pharmacy to be a part of this patient's care.  Lenis Noon, PharmD 10/01/2019 5:08 PM
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
## 2019-10-01 NOTE — Progress Notes (Signed)
  Discontinued all continuous sedation. Turned off Fent and Propofol gtt per order.   Clarified order with NP P. Babcock prior to d/c of drips, agreed to d/c and given PRN Fent as needed for pt agitation and vent compliance.

## 2019-10-02 DIAGNOSIS — L8990 Pressure ulcer of unspecified site, unspecified stage: Secondary | ICD-10-CM | POA: Insufficient documentation

## 2019-10-02 DIAGNOSIS — E43 Unspecified severe protein-calorie malnutrition: Secondary | ICD-10-CM | POA: Insufficient documentation

## 2019-10-02 LAB — COMPREHENSIVE METABOLIC PANEL
ALT: 45 U/L — ABNORMAL HIGH (ref 0–44)
AST: 71 U/L — ABNORMAL HIGH (ref 15–41)
Albumin: 1.8 g/dL — ABNORMAL LOW (ref 3.5–5.0)
Alkaline Phosphatase: 85 U/L (ref 38–126)
Anion gap: 14 (ref 5–15)
BUN: 68 mg/dL — ABNORMAL HIGH (ref 6–20)
CO2: 23 mmol/L (ref 22–32)
Calcium: 8 mg/dL — ABNORMAL LOW (ref 8.9–10.3)
Chloride: 95 mmol/L — ABNORMAL LOW (ref 98–111)
Creatinine, Ser: 1.47 mg/dL — ABNORMAL HIGH (ref 0.61–1.24)
GFR calc Af Amer: >60 mL/min (ref >60)
GFR calc non Af Amer: 52 mL/min — ABNORMAL LOW (ref >60)
Glucose, Bld: 125 mg/dL — ABNORMAL HIGH (ref 70–99)
Potassium: 4.5 mmol/L (ref 3.5–5.1)
Sodium: 132 mmol/L — ABNORMAL LOW (ref 135–145)
Total Bilirubin: 0.5 mg/dL (ref 0.3–1.2)
Total Protein: 5.9 g/dL — ABNORMAL LOW (ref 6.5–8.1)

## 2019-10-02 LAB — CBC
HCT: 25.6 % — ABNORMAL LOW (ref 39.0–52.0)
Hemoglobin: 8.7 g/dL — ABNORMAL LOW (ref 13.0–17.0)
MCH: 33.6 pg (ref 26.0–34.0)
MCHC: 34 g/dL (ref 30.0–36.0)
MCV: 98.8 fL (ref 80.0–100.0)
Platelets: 254 10*3/uL (ref 150–400)
RBC: 2.59 MIL/uL — ABNORMAL LOW (ref 4.22–5.81)
RDW: 15.8 % — ABNORMAL HIGH (ref 11.5–15.5)
WBC: 27.3 10*3/uL — ABNORMAL HIGH (ref 4.0–10.5)
nRBC: 0.2 % (ref 0.0–0.2)

## 2019-10-02 LAB — GLUCOSE, CAPILLARY
Glucose-Capillary: 118 mg/dL — ABNORMAL HIGH (ref 70–99)
Glucose-Capillary: 135 mg/dL — ABNORMAL HIGH (ref 70–99)
Glucose-Capillary: 137 mg/dL — ABNORMAL HIGH (ref 70–99)
Glucose-Capillary: 141 mg/dL — ABNORMAL HIGH (ref 70–99)
Glucose-Capillary: 150 mg/dL — ABNORMAL HIGH (ref 70–99)
Glucose-Capillary: 135 mg/dL — ABNORMAL HIGH (ref 70–99)

## 2019-10-02 LAB — PHOSPHORUS
Phosphorus: 4.7 mg/dL — ABNORMAL HIGH (ref 2.5–4.6)
Phosphorus: 3 mg/dL (ref 2.5–4.6)

## 2019-10-02 LAB — URINE CULTURE: Culture: >=100000 — AB

## 2019-10-02 LAB — MAGNESIUM
Magnesium: 2.3 mg/dL (ref 1.7–2.4)
Magnesium: 2.5 mg/dL — ABNORMAL HIGH (ref 1.7–2.4)

## 2019-10-02 MED ORDER — METHYLPREDNISOLONE SODIUM SUCC 40 MG IJ SOLR
20.0000 mg | Freq: Two times a day (BID) | INTRAMUSCULAR | Status: DC
  Administered 2019-10-02 – 2019-10-05 (×6): 20 mg via INTRAVENOUS
  Filled 2019-10-02 (×6): qty 1

## 2019-10-02 MED ORDER — VANCOMYCIN HCL 750 MG/150ML IV SOLN
750.0000 mg | INTRAVENOUS | Status: DC
  Administered 2019-10-03: 750 mg via INTRAVENOUS
  Filled 2019-10-02: qty 150

## 2019-10-02 MED ORDER — ALBUTEROL SULFATE (2.5 MG/3ML) 0.083% IN NEBU: 2.5000 mg | INHALATION_SOLUTION | RESPIRATORY_TRACT | Status: DC | PRN

## 2019-10-02 MED ORDER — LACTATED RINGERS IV SOLN: INTRAVENOUS | Status: DC

## 2019-10-02 MED ORDER — IPRATROPIUM-ALBUTEROL 0.5-2.5 (3) MG/3ML IN SOLN
3.0000 mL | Freq: Four times a day (QID) | RESPIRATORY_TRACT | Status: DC
  Administered 2019-10-02 – 2019-10-05 (×11): 3 mL via RESPIRATORY_TRACT
  Filled 2019-10-02 (×12): qty 3

## 2019-10-02 MED ORDER — LIP MEDEX EX OINT
TOPICAL_OINTMENT | CUTANEOUS | Status: DC | PRN
  Filled 2019-10-02: qty 7

## 2019-10-02 MED ORDER — PANTOPRAZOLE SODIUM 40 MG PO PACK
40.0000 mg | PACK | ORAL | Status: DC
  Administered 2019-10-02: 40 mg
  Filled 2019-10-02: qty 20

## 2019-10-02 NOTE — Progress Notes (Signed)
Pharmacy Antibiotic Note  Jack Lester is a 58 y.o. male admitted on 09/17/2019 with pneumonia.  Pharmacy has been consulted for vancomycin and cefepime dosing.  Pt has PMH significant for COPD, FTT, rectal and lung cancer with brain mets. Broad spectrum antibiotics initiated on admission.   Today, 10/02/19  - Day 3 Abxs - WBC 27.3 up - SCr 1.47 up CrCl 36 - PCT 33.8 on admission - Tmax 100.6 F - urine cx growing GNR, sputum cx still pending  Plan:  Continue cefepime 2 g IV q12h  Decrease vancomycin from 750 mg IV q24h to 750mg  IV q36 due to rising SCr  Goal vancomycin AUC 400-550  Monitor renal function closely  Height: 5\' 9"  (175.3 cm) Weight: 100 lb 8.5 oz (45.6 kg) IBW/kg (Calculated) : 70.7  Temp (24hrs), Avg:99.6 F (37.6 C), Min:98.6 F (37 C), Max:100.6 F (38.1 C)  Recent Labs  Lab 09/20/2019 1509 09/10/2019 1830 10/01/19 0243 10/02/19 0519  WBC 31.5* 21.4* 14.5* 27.3*  CREATININE 0.84 0.82 1.14 1.47*  LATICACIDVEN  --  3.0*  --   --     Estimated Creatinine Clearance: 35.8 mL/min (A) (by C-G formula based on SCr of 1.47 mg/dL (H)).    No Known Allergies  Antimicrobials this admission: vancomycin 1/28 >>  cefepime 1/28 >>   Dose adjustments this admission: 1/29: cefepime 2 g q8h --> 2 g q12h 1/29: vanc 1 g q24h --> 750 mg q24h  Microbiology results: 1/28 BCx: ngtd 1/28 UCx: >100k GNR 1/28 Sputum: Pending  1/28 MRSA PCR: Negative  Thank you for allowing pharmacy to be a part of this patient's care.  Kara Mead, PharmD 10/02/2019 9:27 AM

## 2019-10-02 NOTE — Progress Notes (Signed)
 NAME:  Jack Lester, MRN:  [id number], DOB:  12/24/1960, LOS: 2 ADMISSION DATE:  09/24/2019, CONSULTATION DATE:  09/13/2019 PCP Edwards, Michelle P, NP Oncologist include Dr. Manning and Dr. Mohamed.  REFERRING MD:  - Dr John Knapp of ER, CHIEF COMPLAINT:  Acute on chronic respiratory hypercapnic and hypoxemic respiratory failure   Brief History   58 yo male smoker with COPD (FEV1 1.4L (46%), DLCO 33%) on 3 liters O2 at home and diagnosed in 2020 with stage IV NSCLC (adenocarcinoma) with bone and brain mets, and rectal cancer presented with respiratory failure and sepsis from HCAP.  Past Medical History  COPD, Metastatic lung cancer, rectal cancer, Back pain, GERD, HTN  Significant Hospital Events   1/28 Admit  Consults:    Procedures:  ETT 1/28 >>   Significant Diagnostic Tests:  CT chest 1/28 >> infiltrative Rt hilar tumor less apparent, new b/l superior segment lower lobe opacification with areas of cavitation, Rt hilar mass, new LUL patchy opacity, new lingular opacity, enlarged met to T11, lytic destruction of Rt 1st rib, centrilobular emphysema CT head 1/28 >> negative  Micro Data:  SARS CoV2 PCR 1/28 >> negative Influenza PCR 1/28 >> A and B negative RVP 1/28 >> negative Blood 1/28 >> Urine 1/28 >> GNR >>  Sputum 1/29 >>  Antimicrobials:  Vancomycin 1/28 >> Cefepime 1/28 >>   Interim history/subjective:  Remains on full vent support with increased PEEP and FiO2.  Objective   Blood pressure 102/74, pulse 90, temperature (!) 100.6 F (38.1 C), resp. rate 20, height 5' 9" (1.753 m), weight 45.6 kg, SpO2 99 %.    Vent Mode: PRVC FiO2 (%):  [40 %-100 %] 60 % Set Rate:  [14 bmp] 14 bmp Vt Set:  [560 mL] 560 mL PEEP:  [5 cmH20-8 cmH20] 8 cmH20 Pressure Support:  [5 cmH20] 5 cmH20 Plateau Pressure:  [18 cmH20-20 cmH20] 19 cmH20   Intake/Output Summary (Last 24 hours) at 10/02/2019 0850 Last data filed at 10/02/2019 0814 Gross per 24 hour  Intake 1304.51 ml    Output 365 ml  Net 939.51 ml   Filed Weights   09/15/2019 1500 10/01/19 0204 10/02/19 0500  Weight: 49.9 kg 43.3 kg 45.6 kg    Examination:  General - sedated Eyes - pupils reactive, sclera injected ENT - ETT in place Cardiac - regular rate/rhythm, no murmur Chest - basilar crackles, no wheeze Abdomen - soft, non tender, + bowel sounds Extremities - decreased muscle bulk Skin - sacral pressure injury Neuro - RASS -2    Resolved Hospital Problem list   Lactic acidosis  Assessment & Plan:   Acute on chronic hypoxic respiratory failure from bacterial HCAP with sepsis. Severe COPD with emphysema. - full vent support - f/u CXR - scheduled BDs - day 3 of ABx - change solumedrol to 20 mg bid  AKI from ATN secondary to hypoxia. - baseline creatinine 0.87 from 08/25/19  f/u BMET  Stage 4 NSCLC with mets to bone and brain. Rectal cancer. - s/p SRS to brain lesion 09/02/19 - followed by Dr. Mohamed >> on carboplatin, alimta, keytruda as outpt and palliative XRT to bone mets  Anemia of chronic disease and critical illness. - f/u CBC - transfuse for Hb < 7 or significant bleeding  Pressure injury. - Stage 2 to sacrum - present on admission - wound care  Severe protein calorie malnutrition with COPD and cancer cachexia. - tube feeds  Steroid induced hyperglycemia. - SSI  Acute metabolic encephalopathy   2nd to hypoxia. - RASS goal 0 to -1  Hx of HTN. - hold outpt norvasc, lopressor, lasix  Best practice:  Diet: tube feeds DVT prophylaxis: SQ heparin GI prophylaxis: protonix Mobility: Bedrest Code Status: full code Disposition: ICU  Labs:   CMP Latest Ref Rng & Units 10/02/2019 10/01/2019 09/09/2019  Glucose 70 - 99 mg/dL 125(H) 89 123(H)  BUN 6 - 20 mg/dL 68(H) 37(H) 29(H)  Creatinine 0.61 - 1.24 mg/dL 1.47(H) 1.14 0.82  Sodium 135 - 145 mmol/L 132(L) 128(L) 126(L)  Potassium 3.5 - 5.1 mmol/L 4.5 4.5 4.6  Chloride 98 - 111 mmol/L 95(L) 90(L) 88(L)  CO2  22 - 32 mmol/L _0 Calcium 8.9 - 10.3 mg/dL 8.0(L) 7.7(L) 7.9(L)  Total Protein 6.5 - 8.1 g/dL 5.9(L) - 6.0(L)  Total Bilirubin 0.3 - 1.2 mg/dL 0.5 - 0.9  Alkaline Phos 38 - 126 U/L 85 - 108  AST 15 - 41 U/L 71(H) - 111(H)  ALT 0 - 44 U/L 45(H) - 50(H)    CBC Latest Ref Rng & Units 10/02/2019 10/01/2019 09/24/2019  WBC 4.0 - 10.5 K/uL 27.3(H) 14.5(H) 21.4(H)  Hemoglobin 13.0 - 17.0 g/dL 8.7(L) 9.0(L) 11.0(L)  Hematocrit 39.0 - 52.0 % 25.6(L) 25.7(L) 32.7(L)  Platelets 150 - 400 K/uL 254 227 290    [name]    Component Value Date/Time   PHART 7.480 (H) 10/01/2019 0318   PCO2ART 32.2 10/01/2019 0318   PO2ART 147 (H) 10/01/2019 0318   HCO3 23.7 10/01/2019 0318   TCO2 22 01/24/2015 0831   ACIDBASEDEF 1.9 09/15/2019 1429   O2SAT 98.7 10/01/2019 0318    CBG (last 3)  Recent Labs    10/01/19 2018 10/01/19 2333 10/02/19 0414  GLUCAP 102* 118* 135*    CC time 36 minutes  Chesley Mires, MD [place] 10/02/2019, 9:19 AM

## 2019-10-02 NOTE — Progress Notes (Addendum)
Spoke with pt's fiancee over the phone.  Updated about current status.  Expressed concern about lack of significant progress with vent weaning.  She is still optimistic he can recover and would want to continue current aggressive measures.  Time spent 12 minutes.  Chesley Mires, MD [facility] Pulmonary/Critical Care 10/02/2019, 2:02 PM

## 2019-10-03 ENCOUNTER — Inpatient Hospital Stay (HOSPITAL_COMMUNITY): Payer: Managed Care, Other (non HMO)

## 2019-10-03 LAB — CBC
HCT: 24.2 % — ABNORMAL LOW (ref 39.0–52.0)
Hemoglobin: 8.3 g/dL — ABNORMAL LOW (ref 13.0–17.0)
MCH: 33.2 pg (ref 26.0–34.0)
MCHC: 34.3 g/dL (ref 30.0–36.0)
MCV: 96.8 fL (ref 80.0–100.0)
Platelets: 223 10*3/uL (ref 150–400)
RBC: 2.5 MIL/uL — ABNORMAL LOW (ref 4.22–5.81)
RDW: 15.6 % — ABNORMAL HIGH (ref 11.5–15.5)
WBC: 30.1 10*3/uL — ABNORMAL HIGH (ref 4.0–10.5)
nRBC: 0.1 % (ref 0.0–0.2)

## 2019-10-03 LAB — GLUCOSE, CAPILLARY
Glucose-Capillary: 134 mg/dL — ABNORMAL HIGH (ref 70–99)
Glucose-Capillary: 141 mg/dL — ABNORMAL HIGH (ref 70–99)
Glucose-Capillary: 151 mg/dL — ABNORMAL HIGH (ref 70–99)
Glucose-Capillary: 164 mg/dL — ABNORMAL HIGH (ref 70–99)
Glucose-Capillary: 88 mg/dL (ref 70–99)
Glucose-Capillary: 92 mg/dL (ref 70–99)

## 2019-10-03 LAB — BASIC METABOLIC PANEL
Anion gap: 13 (ref 5–15)
BUN: 74 mg/dL — ABNORMAL HIGH (ref 6–20)
CO2: 24 mmol/L (ref 22–32)
Calcium: 8.2 mg/dL — ABNORMAL LOW (ref 8.9–10.3)
Chloride: 99 mmol/L (ref 98–111)
Creatinine, Ser: 1.01 mg/dL (ref 0.61–1.24)
GFR calc Af Amer: >60 mL/min (ref >60)
GFR calc non Af Amer: >60 mL/min (ref >60)
Glucose, Bld: 154 mg/dL — ABNORMAL HIGH (ref 70–99)
Potassium: 3.5 mmol/L (ref 3.5–5.1)
Sodium: 136 mmol/L (ref 135–145)

## 2019-10-03 LAB — LEGIONELLA PNEUMOPHILA SEROGP 1 UR AG: L. pneumophila Serogp 1 Ur Ag: NEGATIVE

## 2019-10-03 MED ORDER — PANTOPRAZOLE SODIUM 40 MG IV SOLR
40.0000 mg | INTRAVENOUS | Status: DC
  Administered 2019-10-03 – 2019-10-04 (×2): 40 mg via INTRAVENOUS
  Filled 2019-10-03 (×2): qty 40

## 2019-10-03 MED ORDER — CEFAZOLIN SODIUM-DEXTROSE 2-4 GM/100ML-% IV SOLN
2.0000 g | Freq: Three times a day (TID) | INTRAVENOUS | Status: DC
  Administered 2019-10-03 – 2019-10-06 (×9): 2 g via INTRAVENOUS
  Filled 2019-10-03 (×12): qty 100

## 2019-10-03 MED ORDER — POTASSIUM CHLORIDE 20 MEQ PO PACK
20.0000 meq | PACK | Freq: Once | ORAL | Status: DC
  Filled 2019-10-03: qty 1

## 2019-10-03 MED ORDER — POTASSIUM CHLORIDE 20 MEQ PO PACK
20.0000 meq | PACK | Freq: Once | ORAL | Status: AC
  Administered 2019-10-03: 20 meq via NASOGASTRIC

## 2019-10-03 MED ORDER — ORAL CARE MOUTH RINSE
15.0000 mL | Freq: Two times a day (BID) | OROMUCOSAL | Status: DC
  Administered 2019-10-03 – 2019-10-05 (×4): 15 mL via OROMUCOSAL

## 2019-10-03 NOTE — Progress Notes (Signed)
eLink Physician-Brief Progress Note Patient Name: Jack Lester DOB: April 26, 1961 MRN: [id number]   Date of Service  10/03/2019  HPI/Events of Note  Notified of hypokalemia K 3.5, Crea 1.01.  eICU Interventions  60meq KCl ordered.     Intervention Category Minor Interventions: Electrolytes abnormality - evaluation and management  Elsie Lincoln 10/03/2019, 6:18 AM

## 2019-10-03 NOTE — Progress Notes (Addendum)
NAME:  Jack Lester, MRN:  [id number], DOB:  06-12-1961, LOS: 3 ADMISSION DATE:  09/22/2019, CONSULTATION DATE:  09/29/2019 PCP Kerin Perna, NP Oncologist include Dr. Tammi Klippel and Dr. Julien Nordmann.  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  Past Medical History  COPD, Metastatic lung cancer, rectal cancer, Back pain, GERD, HTN  Significant Hospital Events   1/28 Admit 1/31 Extubation trial  Consults:    Procedures:  ETT 1/28 >> 1/31  Significant Diagnostic Tests:  CT chest 1/28 >> infiltrative Rt hilar tumor less apparent, new b/l superior segment lower lobe opacification with areas of cavitation, Rt hilar mass, new LUL patchy opacity, new lingular opacity, enlarged met to T11, lytic destruction of Rt 1st rib, centrilobular emphysema CT head 1/28 >> negative  Micro Data:  SARS CoV2 PCR 1/28 >> negative Influenza PCR 1/28 >> A and B negative RVP 1/28 >> negative Blood 1/28 >> Urine 1/28 >> Citrobacter koseri Sputum 1/29 >> Staph aureus >>   Antimicrobials:  Vancomycin 1/28 >> Cefepime 1/28 >>   Interim history/subjective:  Pressure support.  Objective   Blood pressure 112/83, pulse 75, temperature 97.9 F (36.6 C), resp. rate 18, height 5' 9"  (1.753 m), weight 44.9 kg, SpO2 100 %.    Vent Mode: PSV;CPAP FiO2 (%):  [40 %-50 %] 40 % Set Rate:  [14 bmp] 14 bmp Vt Set:  [560 mL] 560 mL PEEP:  [5 cmH20-8 cmH20] 5 cmH20 Pressure Support:  [5 cmH20] 5 cmH20 Plateau Pressure:  [17 cmH20-23 cmH20] 17 cmH20   Intake/Output Summary (Last 24 hours) at 10/03/2019 0845 Last data filed at 10/03/2019 7371 Gross per 24 hour  Intake  1843.25 ml  Output 1485 ml  Net 358.25 ml   Filed Weights   10/01/19 0204 10/02/19 0500 10/03/19 0500  Weight: 43.3 kg 45.6 kg 44.9 kg    Examination:  General - sedated Eyes - pupils reactive ENT - ETT in place Cardiac - regular rate/rhythm, no murmur Chest - b/l rhonchi Abdomen - soft, non tender, + bowel sounds Extremities - decreased muscle bulk Skin - sacral pressure injury Neuro - RASS 0   Resolved Hospital Problem list   Lactic acidosis  Assessment & Plan:   Acute on chronic hypoxic respiratory failure from Staph aureus HCAP with sepsis. Severe COPD with emphysema. - extubation trial 1/31 - f/u CXR intermittently - narrow ABx once culture results finalized - scheduled BDs - continue solumedrol - goal SpO2 > 92%  Citrobacter UTI. - day 4 of ABx  AKI from ATN secondary to hypoxia. - baseline creatinine 0.87 from 08/25/19 - f/u BMET  Stage 4 NSCLC with mets to bone and brain. Rectal cancer. - s/p SRS to brain lesion 09/02/19 - followed by Dr. Julien Nordmann >> on carboplatin, alimta, Beryle Flock as outpt and palliative XRT to bone mets  Anemia of chronic disease and critical illness. - f/u CBC - transfuse for Hb < 7 or significant bleeding  Pressure injury. - Stage 2 to sacrum - present on admission - wound care  Severe protein calorie malnutrition with COPD and cancer cachexia. - speech to assess swallowing after extubation, then advance  diet as able  Steroid induced hyperglycemia. - SSI  Acute metabolic encephalopathy 2nd to hypoxia. - monitor mental status after extubation  Hx of HTN. - hold outpt norvasc, lopressor, lasix  Best practice:  Diet: NPO DVT prophylaxis: SQ heparin GI prophylaxis: protonix Mobility: mobilize as able after extubation Code Status: full code Disposition: ICU  Labs:   CMP Latest Ref Rng & Units 10/03/2019 10/02/2019 10/01/2019  Glucose 70 - 99 mg/dL 154(H) 125(H) 89  BUN 6 - 20 mg/dL 74(H) 68(H) 37(H)  Creatinine  0.61 - 1.24 mg/dL 1.01 1.47(H) 1.14  Sodium 135 - 145 mmol/L 136 132(L) 128(L)  Potassium 3.5 - 5.1 mmol/L 3.5 4.5 4.5  Chloride 98 - 111 mmol/L 99 95(L) 90(L)  CO2 22 - 32 mmol/L 24 23 23   Calcium 8.9 - 10.3 mg/dL 8.2(L) 8.0(L) 7.7(L)  Total Protein 6.5 - 8.1 g/dL - 5.9(L) -  Total Bilirubin 0.3 - 1.2 mg/dL - 0.5 -  Alkaline Phos 38 - 126 U/L - 85 -  AST 15 - 41 U/L - 71(H) -  ALT 0 - 44 U/L - 45(H) -    CBC Latest Ref Rng & Units 10/03/2019 10/02/2019 10/01/2019  WBC 4.0 - 10.5 K/uL 30.1(H) 27.3(H) 14.5(H)  Hemoglobin 13.0 - 17.0 g/dL 8.3(L) 8.7(L) 9.0(L)  Hematocrit 39.0 - 52.0 % 24.2(L) 25.6(L) 25.7(L)  Platelets 150 - 400 K/uL 223 254 227    [name]    Component Value Date/Time   PHART 7.480 (H) 10/01/2019 0318   PCO2ART 32.2 10/01/2019 0318   PO2ART 147 (H) 10/01/2019 0318   HCO3 23.7 10/01/2019 0318   TCO2 22 01/24/2015 0831   ACIDBASEDEF 1.9 09/26/2019 1429   O2SAT 98.7 10/01/2019 0318    CBG (last 3)  Recent Labs    10/03/19 0017 10/03/19 0425 10/03/19 0754  GLUCAP 164* 151* 134*    CC time 32 minutes  Chesley Mires, MD [facility] Pulmonary/Critical Care 10/03/2019, 8:45 AM

## 2019-10-03 NOTE — Progress Notes (Signed)
Pharmacy Antibiotic Note  Jack Lester is a 59 y.o. male admitted on 09/03/2019 with pneumonia.  Pharmacy has been consulted for vancomycin and cefepime dosing. Citrobacter and MSSA isolated in urine and sputum cultures - to narrow abx's to Ancef per CCM.    Plan:  Start Ancef 2g IV q8 per current renal function  Monitor renal function closely  Height: 5\' 9"  (175.3 cm) Weight: 98 lb 15.8 oz (44.9 kg) IBW/kg (Calculated) : 70.7  Temp (24hrs), Avg:98.8 F (37.1 C), Min:97.5 F (36.4 C), Max:100.2 F (37.9 C)  Recent Labs  Lab 09/25/2019 1509 09/09/2019 1830 10/01/19 0243 10/02/19 0519 10/03/19 0218  WBC 31.5* 21.4* 14.5* 27.3* 30.1*  CREATININE 0.84 0.82 1.14 1.47* 1.01  LATICACIDVEN  --  3.0*  --   --   --     Estimated Creatinine Clearance: 51.2 mL/min (by C-G formula based on SCr of 1.01 mg/dL).    No Known Allergies  Antimicrobials this admission: vancomycin 1/28 >> 1/31 cefepime 1/28 >> 1/31 Ancef 1/31 >>   Dose adjustments this admission: 1/29: cefepime 2 g q8h --> 2 g q12h 1/29: vanc 1 g q24h --> 750 mg q24h  Microbiology results: 1/28 BCx: ngtd 1/28 UCx: >100k GNR - citrobacter 1/28 Sputum: MSSa 1/28 MRSA PCR: Negative  Thank you for allowing pharmacy to be a part of this patient's care.  Kara Mead, PharmD 10/03/2019 12:58 PM
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
## 2019-10-03 NOTE — Progress Notes (Signed)
Sputum cultures shows MSSA and Citrobacter koseri (also found in urine culture and pan-sensitive).  Will change ABx to Ancef.  Chesley Mires, MD [facility] Pulmonary/Critical Care 10/03/2019, 12:43 PM

## 2019-10-03 NOTE — Progress Notes (Signed)
PT Cancellation Note  Patient Details Name: Jack Lester MRN: [id number] DOB: [birth year]   Cancelled Treatment:    Reason Eval/Treat Not Completed: Order received. Chart reviewed. Pt extubated today. PT will check back on tomorrow for PT eval. Thanks.   Doreatha Massed, PT Acute Rehabilitation

## 2019-10-03 NOTE — Procedures (Signed)
Extubation Procedure Note  Patient Details:   Name: Jack Lester DOB: December 10, 1960 MRN: [id number]   Airway Documentation:    Vent end date: 10/03/19 Vent end time: 0850   Evaluation  O2 sats: stable throughout Complications: No apparent complications Patient did tolerate procedure well. Bilateral Breath Sounds: Diminished  Pt placed on 3 lpm Young Yes, Pt able to speak after extubation.  Nelida Gores, [place] Rickey L 10/03/2019, 8:57 AM

## 2019-10-04 ENCOUNTER — Inpatient Hospital Stay (HOSPITAL_COMMUNITY): Payer: Managed Care, Other (non HMO)

## 2019-10-04 ENCOUNTER — Telehealth: Payer: Self-pay | Admitting: Medical Oncology

## 2019-10-04 DIAGNOSIS — E43 Unspecified severe protein-calorie malnutrition: Secondary | ICD-10-CM

## 2019-10-04 LAB — GLUCOSE, CAPILLARY
Glucose-Capillary: 89 mg/dL (ref 70–99)
Glucose-Capillary: 88 mg/dL (ref 70–99)
Glucose-Capillary: 118 mg/dL — ABNORMAL HIGH (ref 70–99)
Glucose-Capillary: 193 mg/dL — ABNORMAL HIGH (ref 70–99)
Glucose-Capillary: 164 mg/dL — ABNORMAL HIGH (ref 70–99)
Glucose-Capillary: 210 mg/dL — ABNORMAL HIGH (ref 70–99)

## 2019-10-04 LAB — BASIC METABOLIC PANEL
Anion gap: 16 — ABNORMAL HIGH (ref 5–15)
BUN: 52 mg/dL — ABNORMAL HIGH (ref 6–20)
CO2: 24 mmol/L (ref 22–32)
Calcium: 8.4 mg/dL — ABNORMAL LOW (ref 8.9–10.3)
Chloride: 103 mmol/L (ref 98–111)
Creatinine, Ser: 0.63 mg/dL (ref 0.61–1.24)
GFR calc Af Amer: >60 mL/min (ref >60)
GFR calc non Af Amer: >60 mL/min (ref >60)
Glucose, Bld: 95 mg/dL (ref 70–99)
Potassium: 2.9 mmol/L — ABNORMAL LOW (ref 3.5–5.1)
Sodium: 143 mmol/L (ref 135–145)

## 2019-10-04 LAB — CULTURE, RESPIRATORY W GRAM STAIN: Special Requests: NORMAL

## 2019-10-04 LAB — CBC
HCT: 29.5 % — ABNORMAL LOW (ref 39.0–52.0)
Hemoglobin: 9.8 g/dL — ABNORMAL LOW (ref 13.0–17.0)
MCH: 32.9 pg (ref 26.0–34.0)
MCHC: 33.2 g/dL (ref 30.0–36.0)
MCV: 99 fL (ref 80.0–100.0)
Platelets: 244 10*3/uL (ref 150–400)
RBC: 2.98 MIL/uL — ABNORMAL LOW (ref 4.22–5.81)
RDW: 16.6 % — ABNORMAL HIGH (ref 11.5–15.5)
WBC: 38.6 10*3/uL — ABNORMAL HIGH (ref 4.0–10.5)
nRBC: 0.3 % — ABNORMAL HIGH (ref 0.0–0.2)

## 2019-10-04 LAB — MAGNESIUM: Magnesium: 2.3 mg/dL (ref 1.7–2.4)

## 2019-10-04 MED ORDER — AMLODIPINE BESYLATE 10 MG PO TABS
10.0000 mg | ORAL_TABLET | Freq: Every day | ORAL | Status: DC
  Administered 2019-10-04 – 2019-10-05 (×2): 10 mg via ORAL
  Filled 2019-10-04 (×2): qty 1

## 2019-10-04 MED ORDER — DRONABINOL 2.5 MG PO CAPS
2.5000 mg | ORAL_CAPSULE | Freq: Two times a day (BID) | ORAL | Status: DC
  Administered 2019-10-04 – 2019-10-05 (×3): 2.5 mg via ORAL
  Filled 2019-10-04 (×3): qty 1

## 2019-10-04 MED ORDER — POTASSIUM CHLORIDE 10 MEQ/100ML IV SOLN
10.0000 meq | INTRAVENOUS | Status: AC
  Administered 2019-10-04 (×4): 10 meq via INTRAVENOUS
  Filled 2019-10-04 (×4): qty 100

## 2019-10-04 MED ORDER — METOPROLOL TARTRATE 25 MG PO TABS
25.0000 mg | ORAL_TABLET | Freq: Two times a day (BID) | ORAL | Status: DC
  Administered 2019-10-05: 25 mg via ORAL
  Filled 2019-10-04: qty 1

## 2019-10-04 MED ORDER — NYSTATIN 100000 UNIT/ML MT SUSP
5.0000 mL | Freq: Four times a day (QID) | OROMUCOSAL | Status: DC
  Administered 2019-10-04 – 2019-10-05 (×5): 500000 [IU] via OROMUCOSAL
  Filled 2019-10-04 (×5): qty 5

## 2019-10-04 MED ORDER — OXYCODONE-ACETAMINOPHEN 5-325 MG PO TABS
1.0000 | ORAL_TABLET | Freq: Four times a day (QID) | ORAL | Status: DC | PRN
  Administered 2019-10-04: 1 via ORAL
  Filled 2019-10-04: qty 1

## 2019-10-04 MED ORDER — PANTOPRAZOLE SODIUM 40 MG PO TBEC
40.0000 mg | DELAYED_RELEASE_TABLET | Freq: Every day | ORAL | Status: DC
  Administered 2019-10-05: 40 mg via ORAL
  Filled 2019-10-04: qty 1

## 2019-10-04 NOTE — Progress Notes (Signed)
  Speech Language Pathology Treatment: Dysphagia  Patient Details Name: Jack Lester MRN: [id number] DOB: [birth year] Today's Date: 10/04/2019 Time: 9826-4158 SLP Time Calculation (min) (ACUTE ONLY): 19 min  Assessment / Plan / Recommendation Clinical Impression  F/u after yesterday's clinical swallow examination.  Today, pt presents with multiple sub-swallows with each bolus (up to five) with puree and liquid consistencies.  There was no coughing, but obvious effort to swallow and to clear the bolus.  Pt states swallowing has been more difficult the last few weeks.  Recommend proceeding with MBS to elucidate pathophysiology of swallow and to give pt/family further information about overall condition. Scheduled for 10 am.  HPI HPI: 59 year old male with significant failure to thrive with a BMI less than 15 associated with gold stage III COPD with FEV1 1.4 L / 46% and a DLCO 33% in August 2020 and on 3 L of oxygen at home.  Late 2020 diagnosis of advanced rectal cancer and lung cancer with brain mets and status post radiosurgery and other radiation therapy ending mid January 2021.  Presented to the ED with progressive shortness of breath found to have severe respiratory acidosis with hypoxemic respiratory failure and intubated.  Pt was intubated from 1/28-1/31.  There is a note in the chart upon admission questioning the presence of dysphagia,.      SLP Plan  MBS       Recommendations  Diet recommendations: Other(comment)(pending MBS)                Oral Care Recommendations: Oral care BID Follow up Recommendations: Other (comment)(tba) SLP Visit Diagnosis: Dysphagia, unspecified (R13.10) Plan: MBS       GO               Kasidy Gianino L. Tivis Ringer, [place] CCC/SLP Acute Rehabilitation Services Office number [phone] Pager [phone]  [name] 10/04/2019, 9:10 AM

## 2019-10-04 NOTE — Progress Notes (Signed)
Modified Barium Swallow Progress Note  Patient Details  Name: Jack Lester MRN: [id number] Date of Birth: 1960-10-28  Today's Date: 10/04/2019  Modified Barium Swallow completed.  Full report located under Chart Review in the Imaging Section.  Brief recommendations include the following:  Clinical Impression   Pt presents with a mild pharyngeal dysphagia marked by slight residue in pharynx that remains post-swallow.  Pt with adequate mastication and oral preparation.  Initial swallows of thin liquids led to penetration of the larynx, with ejection of liquids upon completion of the swallow.  There was no aspiration.  There was incomplete laryngeal vestibule closure with epiglottis moved into horizontal position, but reduced pharyngeal squeeze to complete the inversion.  Reduced pharyngeal squeeze contributes to presence of mild residue, which pt senses, and uses f/u sub-swallows to clear.  As the study progressed, timing and movement of structures appeared to improve.  Larger boluses improved epiglottic inversion.  Recommend that pt resume a regular diet, thin liquids to allow more PO options.  Swallow may be slightly weak but is certainly functional. No further SLP f/u is indicated at this time.  Our service will sign off.    Swallow Evaluation Recommendations       SLP Diet Recommendations: Regular solids;Thin liquid   Liquid Administration via: Cup;Straw   Medication Administration: Whole meds with liquid   Supervision: Patient able to self feed           Oral Care Recommendations: Oral care BID       Wess Baney L. Tivis Ringer, [place] CCC/SLP Acute Rehabilitation Services Office number [phone]   [name] 10/04/2019,10:29 AM

## 2019-10-04 NOTE — Progress Notes (Signed)
OT Cancellation Note  Patient Details Name: CAMILO MANDER MRN: [id number] DOB: Sep 02, 1961   Cancelled Treatment:    Reason Eval/Treat Not Completed: Other (comment)  Pt with limited participation with PT this day. Will check on next day for OT eval.  Kari Baars, [place] [phone] Office- [phone], Thereasa Parkin 10/04/2019, 5:18 PM

## 2019-10-04 NOTE — Evaluation (Signed)
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  Clinical Impression  Pt admitted with above diagnosis.  Eval limited, pt initially agreed to PT/OOB then refused when movement initiated. Will continue to follow. Pt much more confused this pm per RN ( brain mets, no family present).  Recommend HHPT if family able to provide 24 hour assist, if not may need SNF.   Pt currently with functional limitations due to the deficits listed below (see PT Problem List). Pt will benefit from skilled PT to increase their independence and safety with mobility to allow discharge to the venue listed below.       Follow Up Recommendations Home health PT;Supervision/Assistance - 24 hour(if family unable to provide, recommend SNF)    Equipment Recommendations  Other (comment)(TBD)    Recommendations for Other Services       Precautions / Restrictions Precautions Precautions: Fall Precaution Comments: intermittent confusion Restrictions Weight Bearing Restrictions: No      Mobility  Bed Mobility Overal bed mobility: Needs Assistance             General bed mobility comments: pt initially agreed to EOB, then refused when intiated  Transfers                 General transfer comment: NT/pt refused  Ambulation/Gait                Stairs            Wheelchair Mobility    Modified Rankin (Stroke Patients Only)       Balance Overall balance assessment: (pt reports recent falls)                                           Pertinent Vitals/Pain Pain Assessment: No/denies pain    Home Living Family/patient expects to  be discharged to:: Private residence Living Arrangements: Spouse/significant other Available Help at Discharge: Family;Available 24 hours/day           Home Equipment: Walker - 2 wheels;Wheelchair - manual Additional Comments: above info may be in accurate as pt is an unreliable historian, no family present    Prior Function Level of Independence: Needs assistance   Gait / Transfers Assistance Needed: non-amb (likely --pt initially stated he walked with a RW), later states he slides/scoots into a w/c  ADL's / Homemaking Assistance Needed: fiance assists        Hand Dominance        Extremity/Trunk Assessment   Upper Extremity Assessment Upper Extremity Assessment: Generalized weakness    Lower Extremity Assessment Lower Extremity Assessment: RLE deficits/detail;LLE deficits/detail RLE Deficits / Details: grossly 2+/5, AAROM grossly WFL, diffuse muscle atrophy throughout LEs LLE Deficits / Details: grossly 2+/5, AAROM grossly WFL, diffuse muscle atrophy throughout LEs       Communication   Communication: No difficulties  Cognition Arousal/Alertness: Awake/alert Behavior During Therapy: Restless Overall Cognitive Status: Impaired/Different from baseline Area of Impairment: Attention;Safety/judgement;Problem solving;Memory;Following commands  Current Attention Level: Focused Memory: Decreased short-term memory Following Commands: Follows one step commands inconsistently Safety/Judgement: Decreased awareness of safety;Decreased awareness of deficits   Problem Solving: Slow processing;Difficulty sequencing;Requires verbal cues General Comments: per RN pt alert and oriented this morning, more confused this pm.      General Comments      Exercises     Assessment/Plan    PT Assessment Patient needs continued PT services  PT Problem List Decreased strength;Decreased mobility;Decreased safety awareness;Decreased activity tolerance;Decreased  balance;Decreased knowledge of use of DME;Decreased cognition       PT Treatment Interventions DME instruction;Therapeutic exercise;Functional mobility training;Therapeutic activities;Patient/family education    PT Goals (Current goals can be found in the Care Plan section)  Acute Rehab PT Goals Patient Stated Goal: none stated/unable PT Goal Formulation: Patient unable to participate in goal setting Time For Goal Achievement: 10/18/19 Potential to Achieve Goals: Good    Frequency Min 3X/week   Barriers to discharge        Co-evaluation               AM-PAC PT "6 Clicks" Mobility  Outcome Measure Help needed turning from your back to your side while in a flat bed without using bedrails?: A Little Help needed moving from lying on your back to sitting on the side of a flat bed without using bedrails?: A Little Help needed moving to and from a bed to a chair (including a wheelchair)?: A Lot Help needed standing up from a chair using your arms (e.g., wheelchair or bedside chair)?: Total Help needed to walk in hospital room?: Total Help needed climbing 3-5 steps with a railing? : Total 6 Click Score: 11    End of Session   Activity Tolerance: Patient tolerated treatment well Patient left: in bed;with call bell/phone within reach;with bed alarm set Nurse Communication: Mobility status PT Visit Diagnosis: Other abnormalities of gait and mobility (R26.89);History of falling (Z91.81)    Time: 6384-5364 PT Time Calculation (min) (ACUTE ONLY): 26 min   Charges:   PT Evaluation $PT Eval Moderate Complexity: 1 Mod          Alphonsine Minium, PT   Acute Rehab Dept [facility]): [phone]   10/04/2019   [facility] 10/04/2019, 4:52 PM

## 2019-10-04 NOTE — Progress Notes (Signed)
NAME:  Jack Lester, MRN:  [id number], DOB:  07/09/1961, LOS: 4 ADMISSION DATE:  09/04/2019, CONSULTATION DATE:  09/27/2019 PCP Kerin Perna, NP Oncologist include Dr. Tammi Klippel and Dr. Julien Nordmann.  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  Past Medical History  COPD, Metastatic lung cancer, rectal cancer, Back pain, GERD, HTN  Significant Hospital Events   1/28 Admit 1/31 Extubation trial  Consults:    Procedures:  ETT 1/28 >> 1/31  Significant Diagnostic Tests:  CT chest 1/28 >> infiltrative Rt hilar tumor less apparent, new b/l superior segment lower lobe opacification with areas of cavitation, Rt hilar mass, new LUL patchy opacity, new lingular opacity, enlarged met to T11, lytic destruction of Rt 1st rib, centrilobular emphysema CT head 1/28 >> negative  Micro Data:  SARS CoV2 PCR 1/28 >> negative Influenza PCR 1/28 >> A and B negative RVP 1/28 >> negative Blood 1/28 >> Urine 1/28 >> Citrobacter koseri Sputum 1/29 >> Staph aureus >>   Antimicrobials:  Vancomycin 1/28 >1/31 Cefepime 1/28 >1/31 Ancef 1/31>>  Interim history/subjective:  Remains extubated HDS Complains of pain and asking for medication  Objective   Blood pressure (!) 149/82, pulse 98, temperature 97.6 F (36.4 C), temperature source Oral, resp. rate 15, height 5' 9"  (1.753 m), weight 44.2 kg, SpO2 92 %.        Intake/Output Summary (Last 24 hours) at 10/04/2019 1116 Last data filed at 10/04/2019 0500 Gross per 24 hour  Intake 1000.14 ml  Output 2000 ml  Net -999.86 ml   Filed Weights   10/02/19 0500 10/03/19 0500 10/04/19 0500  Weight: 45.6 kg 44.9  kg 44.2 kg    Examination:  General - Chronically ill, cachectic appearing middle aged man. Reclined in bed NAD  HEENT- NCAT. Thick white coating on tongue and in anterior oropharynx. Anicteric sclera. Trachea midline.  Cardiac - RRR s1s2 no rgm. 2+ radial pulses Chest - Symmetrical chest expansion. No accessory muscle use. Scattered bibasilar rhonchi  Abdomen - soft, flat, ndnt Extremities - Symmetrically decreased muscle bulk. No obvious joint deformity. No cyanosis or clubbing  Skin - c/d/w. Sacral injury. No rash  Neuro - AAOx3 following commands generally weak but no focal deficits  Psych- anxious, appropriate insight for age and situation    Resolved Hospital Problem list   Lactic acidosis AKI, ATN due to hypoxia Acute metabolic encephalopathy 2nd to hypoxia  Assessment & Plan:   Acute on chronic hypoxic respiratory failure from Staph aureus and citrobacter koseri PNA  Severe COPD with emphysema - extubated 1/31 - Continue ancef  - scheduled BDs - continue solumedrol - goal SpO2 > 92% - IS  Citrobacter UTI - continue abx   Leukocytosis -likely secondary to both infection and steroids -continue abx, trend fever curve   Stage 4 NSCLC with mets to bone and brain. Rectal cancer. - s/p SRS to brain lesion 09/02/19 - followed by Dr. Julien Nordmann >> on carboplatin, alimta, Beryle Flock as outpt and palliative XRT to bone mets -restarting home analgesia   Anemia of chronic disease and critical illness. - trend CBC -hgb goal >7  Pressure injury. - Stage 2  to sacrum - present on admission - wound care  Severe protein calorie malnutrition with COPD and cancer cachexia. Hypokalemia  - Encourage PO intake - RDN consult  -replace K  -adding low-dose BID marinol   Steroid induced hyperglycemia. - SSI  Oral thrush -nystatin swish/spit QID  Hx of HTN. - hold outpt norvasc, lopressor, lasix -continue cardiac monitoring and can restart as indicated   Best practice:    Diet: Regular + thin liquids.  DVT prophylaxis: SQ heparin GI prophylaxis: protonix Mobility: PT/OT Code Status: full code Disposition: I anticipate patient is stable for transfer from ICU to SDU   Labs:   CMP Latest Ref Rng & Units 10/04/2019 10/03/2019 10/02/2019  Glucose 70 - 99 mg/dL 95 154(H) 125(H)  BUN 6 - 20 mg/dL 52(H) 74(H) 68(H)  Creatinine 0.61 - 1.24 mg/dL 0.63 1.01 1.47(H)  Sodium 135 - 145 mmol/L 143 136 132(L)  Potassium 3.5 - 5.1 mmol/L 2.9(L) 3.5 4.5  Chloride 98 - 111 mmol/L 103 99 95(L)  CO2 22 - 32 mmol/L 24 24 23   Calcium 8.9 - 10.3 mg/dL 8.4(L) 8.2(L) 8.0(L)  Total Protein 6.5 - 8.1 g/dL - - 5.9(L)  Total Bilirubin 0.3 - 1.2 mg/dL - - 0.5  Alkaline Phos 38 - 126 U/L - - 85  AST 15 - 41 U/L - - 71(H)  ALT 0 - 44 U/L - - 45(H)    CBC Latest Ref Rng & Units 10/04/2019 10/03/2019 10/02/2019  WBC 4.0 - 10.5 K/uL 38.6(H) 30.1(H) 27.3(H)  Hemoglobin 13.0 - 17.0 g/dL 9.8(L) 8.3(L) 8.7(L)  Hematocrit 39.0 - 52.0 % 29.5(L) 24.2(L) 25.6(L)  Platelets 150 - 400 K/uL 244 223 254    [name]    Component Value Date/Time   PHART 7.480 (H) 10/01/2019 0318   PCO2ART 32.2 10/01/2019 0318   PO2ART 147 (H) 10/01/2019 0318   HCO3 23.7 10/01/2019 0318   TCO2 22 01/24/2015 0831   ACIDBASEDEF 1.9 09/19/2019 1429   O2SAT 98.7 10/01/2019 0318    CBG (last 3)  Recent Labs    10/03/19 2335 10/04/19 0321 10/04/19 0915  GLUCAP 33 89 88    Eliseo Gum MSN, AGACNP-BC [place] [phone] If no answer, [phone] 10/04/2019, 11:16 AM

## 2019-10-04 NOTE — Progress Notes (Signed)
eLink Physician-Brief Progress Note Patient Name: Jack Lester DOB: 1960-12-05 MRN: [id number]   Date of Service  10/04/2019  HPI/Events of Note  K 2.9  eICU Interventions  40 meq IV Kcl ordered     Intervention Category Major Interventions: Electrolyte abnormality - evaluation and management  [name] 10/04/2019, 6:53 AM

## 2019-10-04 NOTE — Telephone Encounter (Signed)
"   Pain pain pain in my back and side" Pt called from his hospital room . I heard the attendant say she will let his nurse know. I reassured the pt that his nurse on the unit  would be notified.

## 2019-10-04 DEATH — deceased

## 2019-10-05 ENCOUNTER — Inpatient Hospital Stay (HOSPITAL_COMMUNITY): Payer: Managed Care, Other (non HMO)

## 2019-10-05 DIAGNOSIS — C3411 Malignant neoplasm of upper lobe, right bronchus or lung: Secondary | ICD-10-CM

## 2019-10-05 DIAGNOSIS — C7951 Secondary malignant neoplasm of bone: Secondary | ICD-10-CM

## 2019-10-05 DIAGNOSIS — C7931 Secondary malignant neoplasm of brain: Secondary | ICD-10-CM

## 2019-10-05 LAB — BASIC METABOLIC PANEL
Anion gap: 10 (ref 5–15)
BUN: 58 mg/dL — ABNORMAL HIGH (ref 6–20)
CO2: 27 mmol/L (ref 22–32)
Calcium: 8.4 mg/dL — ABNORMAL LOW (ref 8.9–10.3)
Chloride: 108 mmol/L (ref 98–111)
Creatinine, Ser: 0.73 mg/dL (ref 0.61–1.24)
GFR calc Af Amer: >60 mL/min (ref >60)
GFR calc non Af Amer: >60 mL/min (ref >60)
Glucose, Bld: 119 mg/dL — ABNORMAL HIGH (ref 70–99)
Potassium: 3.3 mmol/L — ABNORMAL LOW (ref 3.5–5.1)
Sodium: 145 mmol/L (ref 135–145)

## 2019-10-05 LAB — CBC
HCT: 24.6 % — ABNORMAL LOW (ref 39.0–52.0)
Hemoglobin: 8.3 g/dL — ABNORMAL LOW (ref 13.0–17.0)
MCH: 33.7 pg (ref 26.0–34.0)
MCHC: 33.7 g/dL (ref 30.0–36.0)
MCV: 100 fL (ref 80.0–100.0)
Platelets: 222 10*3/uL (ref 150–400)
RBC: 2.46 MIL/uL — ABNORMAL LOW (ref 4.22–5.81)
RDW: 17.3 % — ABNORMAL HIGH (ref 11.5–15.5)
WBC: 38.1 10*3/uL — ABNORMAL HIGH (ref 4.0–10.5)
nRBC: 0.3 % — ABNORMAL HIGH (ref 0.0–0.2)

## 2019-10-05 LAB — CULTURE, BLOOD (ROUTINE X 2)
Culture: NO GROWTH
Culture: NO GROWTH
Culture: NO GROWTH
Culture: NO GROWTH
Special Requests: ADEQUATE
Special Requests: ADEQUATE

## 2019-10-05 LAB — GLUCOSE, CAPILLARY
Glucose-Capillary: 95 mg/dL (ref 70–99)
Glucose-Capillary: 113 mg/dL — ABNORMAL HIGH (ref 70–99)
Glucose-Capillary: 59 mg/dL — ABNORMAL LOW (ref 70–99)
Glucose-Capillary: 114 mg/dL — ABNORMAL HIGH (ref 70–99)

## 2019-10-05 MED ORDER — IPRATROPIUM-ALBUTEROL 0.5-2.5 (3) MG/3ML IN SOLN
3.0000 mL | Freq: Four times a day (QID) | RESPIRATORY_TRACT | Status: DC
  Administered 2019-10-05: 3 mL via RESPIRATORY_TRACT
  Filled 2019-10-05 (×2): qty 3

## 2019-10-05 MED ORDER — LORAZEPAM 2 MG/ML IJ SOLN
0.5000 mg | INTRAMUSCULAR | Status: DC | PRN
  Administered 2019-10-05 – 2019-10-06 (×4): 0.5 mg via INTRAVENOUS
  Filled 2019-10-05 (×4): qty 1

## 2019-10-05 MED ORDER — ADULT MULTIVITAMIN W/MINERALS CH
1.0000 | ORAL_TABLET | Freq: Every day | ORAL | Status: DC
  Administered 2019-10-05: 1 via ORAL
  Filled 2019-10-05: qty 1

## 2019-10-05 MED ORDER — POTASSIUM CHLORIDE CRYS ER 20 MEQ PO TBCR
20.0000 meq | EXTENDED_RELEASE_TABLET | ORAL | Status: DC
  Administered 2019-10-05: 20 meq via ORAL
  Filled 2019-10-05: qty 1

## 2019-10-05 MED ORDER — ENSURE ENLIVE PO LIQD
237.0000 mL | Freq: Three times a day (TID) | ORAL | Status: DC
  Administered 2019-10-05: 237 mL via ORAL

## 2019-10-05 MED ORDER — MORPHINE SULFATE (PF) 2 MG/ML IV SOLN
1.0000 mg | INTRAVENOUS | Status: DC | PRN
  Administered 2019-10-05: 1 mg via INTRAVENOUS
  Filled 2019-10-05: qty 1

## 2019-10-05 MED ORDER — HYOSCYAMINE SULFATE 0.125 MG PO TBDP
0.2500 mg | ORAL_TABLET | Freq: Four times a day (QID) | ORAL | Status: DC | PRN
  Administered 2019-10-05: 0.25 mg via SUBLINGUAL
  Filled 2019-10-05 (×2): qty 2

## 2019-10-05 MED ORDER — FLUCONAZOLE 100 MG PO TABS: 100.0000 mg | ORAL_TABLET | Freq: Every day | ORAL | Status: DC

## 2019-10-05 MED ORDER — POTASSIUM CHLORIDE CRYS ER 20 MEQ PO TBCR
40.0000 meq | EXTENDED_RELEASE_TABLET | Freq: Two times a day (BID) | ORAL | Status: DC
  Administered 2019-10-05: 40 meq via ORAL
  Filled 2019-10-05: qty 2

## 2019-10-05 MED ORDER — PREDNISONE 20 MG PO TABS: 40.0000 mg | ORAL_TABLET | Freq: Every day | ORAL | Status: DC

## 2019-10-05 MED ORDER — BOOST / RESOURCE BREEZE PO LIQD CUSTOM
1.0000 | Freq: Two times a day (BID) | ORAL | Status: DC
  Administered 2019-10-05: 1 via ORAL

## 2019-10-05 MED ORDER — MORPHINE SULFATE (PF) 2 MG/ML IV SOLN
2.0000 mg | INTRAVENOUS | Status: DC | PRN
  Administered 2019-10-05 – 2019-10-06 (×6): 2 mg via INTRAVENOUS
  Filled 2019-10-05 (×6): qty 1

## 2019-10-05 NOTE — Progress Notes (Signed)
Pharmacy: Fluconazole  Plan: fluconazole 100 mg po qday for esophageal  Candidiasis.  Pharmacy to sign off.  Eudelia Bunch, Pharm.D 10/05/2019 1:57 PM

## 2019-10-05 NOTE — Progress Notes (Signed)
Nutrition Follow-up  DOCUMENTATION CODES:   Severe malnutrition in context of chronic illness, Underweight  INTERVENTION:  - will order Boost Breeze BID, each supplement provides 250 kcal and 9 grams of protein. - will order Ensure Enlive TID, each supplement provides 350 kcal and 20 grams of protein. - will order Magic Cup BID with meals, each supplement provides 290 kcal and 9 grams of protein. - will order daily multivitamin with minerals. - recommend ongoing [place] discussions; if patient to remain Full Code, need to discuss PEG placement.    NUTRITION DIAGNOSIS:   Severe Malnutrition related to chronic illness, cancer and cancer related treatments as evidenced by severe fat depletion, severe muscle depletion. -ongoing  GOAL:   Patient will meet greater than or equal to 90% of their needs -unmet  MONITOR:   PO intake, Supplement acceptance, Labs, Weight trends, Skin  REASON FOR ASSESSMENT:   Consult Poor PO  ASSESSMENT:   59 year old male with medical history of COPD on 3L of O2 at home, late 2020 diagnosis of advanced rectal cancer and lung cancer with brain mets, s/p radiosurgery and other radiation therapy ending mid-January 2021. He presented to the ED with progressive SOB found to have severe respiratory acidosis with hypoxemic respiratory failure and was intubated in the ED. Patient reported rapid weight loss and anorexia/poor appetite for the month of January. There is concern for possible dysphagia.  Significant Events: 1/28- intubation in the ED; OGT placement; admission 1/31- extubation; OGT removal 2/1- diet advancement from NPO to Regular at 1020  No intakes documented since diet advancement. Able to talk with RN prior to visiting patient. She reports that patient is not hungry/poor appetite despite marinol order. She reports patient is more willing to accept liquids than solid foods.  Patient confirms poor appetite which has been ongoing for a while (unable to  determine time frame). He states he is having throat discomfort/pain and that this is with and without swallowing. He was holding a cup of orange juice and reported that consumption of juice was not going well due to throat pain and feeling like he could not swallow well and had to take very small sips in order to feel safe.   Estimated nutrition needs updated based on extubation, update skin status, and based on current weight (43.1 kg). Weight has been fairly stable over the past 5 days; will continue to monitor.  He had MBS yesterday morning. SLP note following study states that swallow is slightly weak but remains functional. Recommendation was made for regular solid foods and thin liquids.   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 Medications reviewed; 20 mg solu-medrol BID, 5 ml mycostatin QID, 40 mg oral protonix/day, 10 mEq IV KCl x4 runs 2/1, 40 mEq Klor-Con BID, 2.5 mg marinol BID.  IVF; LR @ 50 ml/hr.    Diet Order:   Diet Order            Diet regular Room service appropriate? Yes; Fluid consistency: Thin  Diet effective now              EDUCATION NEEDS:   No education needs have been identified at this time  Skin:  Skin Assessment: Skin Integrity Issues:  Skin Integrity Issues:: Unstageable Unstageable: full thickness to sacrum  Last BM:  PTA/unknown  Height:   Ht Readings from Last 1 Encounters:  09/18/2019 5\' 9"  (1.753 m)    Weight:   Wt Readings from Last 1 Encounters:  10/05/19 43.1 kg    Ideal Body Weight:  72.7 kg  BMI:  Body mass index is 14.03 kg/m.  Estimated Nutritional Needs:   Kcal:  2180-2400 kcal (30-33 kcal/kg IBW)  Protein:  110-120 grams  Fluid:  >/= 2 L/day     Jarome Matin, MS,  RD, LDN, [facility] Inpatient Clinical Dietitian Pager # [phone] After hours/weekend pager # [phone]

## 2019-10-05 NOTE — Progress Notes (Signed)
[facility] ADULT ICU REPLACEMENT PROTOCOL FOR AM LAB REPLACEMENT ONLY  The patient does apply for the [facility] Adult ICU Electrolyte Replacment Protocol based on the criteria listed below:   1. Is GFR >/= 40 ml/min? Yes.    Patient's GFR today is >60 2. Is urine output >/= 0.5 ml/kg/hr for the last 6 hours? Yes.   Patient's UOP is 3.19 ml/kg/hr 3. Is BUN < 60 mg/dL? Yes.    Patient's BUN today is 58 4. Abnormal electrolyte(s): K+3.3 5. Ordered repletion with: protocol 6. If a panic level lab has been reported, has the CCM MD in charge been notified? Yes.  .   Physician:  Dr.Sommer  Carlisle Beers 10/05/2019 5:11 AM

## 2019-10-05 NOTE — Progress Notes (Addendum)
Hospitalist progress note  Patient from home, Patient going unclear at this time, Dispo unclear  Jack Lester [id number] DOB: 04-Sep-1960 DOA: 09/29/2019  PCP: Kerin Perna, NP   Narrative:  36 black male Gold stage III FEV1 46% DLCO 33% baseline 3 L oxygen Rectal adeno CA 06/22/2019 + lung, first rib T11 mets ATC Recent admission 12/19-12/22 acute respiratory failure Readmit by critical care 09/2819 2/2 severe respiratory acidosis hypoxic R failure Urine grew out Citrobacter koseri, sputum grew staph aureus treated through 1/31 vancomycin and cefepime, extubated 1/31 Developed thrush  Data Reviewed:  Potassium 3.3 BUN/creatinine 58/0.7 down from admission 64/1.4 White count 38, hemoglobin 8.3, platelet 222  Assessment & Plan:  Sepsis secondary to staph aureus, Citrobacter Koseri sputum culture Continuing cefazolin at this time-white count may be more indicative of cancer Continue LR 50 cc/h Oral thrush Continue swish and swallow encourage p.o. intake  must consider disseminated Candida given severity of illness we will get an chest x-ray in a.m. Adding at this time systemic fluconazole-would circumscribe dosing based on LFTs Acute hypoxic failure with respiratory acidosis, underlying gold Stage III COPD on 3 L at home Stage IV NSCLC + rectal CA Extubated 1/31, discontinue IV Solu-Medrol transition to low-dose prednisone and DC Continue DuoNeb 3 mils every 6 in addition to as needed albuterol Stage II pressure injury Severe cachexia BMI 14 secondary to her metastatic cancer-no amount of nutrition will help Prior hypertension, sinus tachycardia Continuing metoprolol 25 twice daily AKI on admission Mild hypokalemia Continue LR 50 cc/h Increase dose of K. Dur to 40 twice daily repeat labs in a.m.  Long discussion with [name] [phone] and discussed abysmal prognosis--she asked q's on chemo-I mentioned his frailty, poor performance scores and severe  malnourishment Helane Gunther Bluemel] is brother whom I spoke with after call to Ms. Ronnald Ramp [phone] at her request-he seemed to understand clearly how ill brother is-he requests that Ms Ronnald Ramp be the "poitn-person" but he will discuss with her and is ok getting palliative care and oncology to look in on patient  Subjective:  Awake coherent Looks extremely scared-looks like he is on the verge of tears When asked him how he is doing, he tells me he is scared when asked him what he is scared if he says he is scared of "death" we had a long discussion subsequent to this but the upshot is I do not think he completely comprehended what I was saying  Consultants:   Taken over from CCM  Objective: Vitals:   10/05/19 0000 10/05/19 0156 10/05/19 0409 10/05/19 0436  BP: (!) 135/97  (!) 146/88   Pulse: (!) 117  (!) 108   Resp: (!) 25  (!) 24   Temp: 98.9 F (37.2 C)  98.8 F (37.1 C)   TempSrc: Oral  Oral   SpO2: 99% 99%    Weight:    43.1 kg  Height:        Intake/Output Summary (Last 24 hours) at 10/05/2019 0743 Last data filed at 10/05/2019 0600 Gross per 24 hour  Intake 1171.66 ml  Output 1325 ml  Net -153.34 ml   Filed Weights   10/03/19 0500 10/04/19 0500 10/05/19 0436  Weight: 44.9 kg 44.2 kg 43.1 kg    Examination: Wasted cachectic aam  Scared appearing cta b no added sound no rales Increased WOB tachypneoic rom intact Neuro intact frail  Scheduled Meds: . amLODipine  10 mg Oral Daily  . Chlorhexidine Gluconate Cloth  6 each Topical Daily  .  dronabinol  2.5 mg Oral BID AC  . feeding supplement  1 Container Oral BID BM  . feeding supplement (ENSURE ENLIVE)  237 mL Oral TID BM  . fluconazole  100 mg Oral Daily  . heparin  5,000 Units Subcutaneous Q8H  . ipratropium-albuterol  3 mL Nebulization Q6H WA  . mouth rinse  15 mL Mouth Rinse BID  . metoprolol tartrate  25 mg Oral BID  . multivitamin with minerals  1 tablet Oral Daily  . nystatin  5 mL Mouth/Throat QID  .  pantoprazole  40 mg Oral Daily  . potassium chloride  40 mEq Oral BID  . [START ON 10/22/2019] predniSONE  40 mg Oral QAC breakfast   Continuous Infusions: .  ceFAZolin (ANCEF) IV Stopped (10/05/19 0246)  . lactated ringers 50 mL/hr at 10/05/19 0509     LOS: 5 days   Time spent: 75 min including 35 min of family discussion time Verneita Griffes, MD [facility]  10/05/2019, 7:43 AM

## 2019-10-05 NOTE — Progress Notes (Signed)
OT Cancellation Note  Patient Details Name: RONIT MARCZAK MRN: [id number] DOB: [birth year]   Cancelled Treatment:    Reason Eval/Treat Not Completed: Other (comment)  Spoke with RN and pt not doing as well today.  Pt declined OT. Pt seemed uncomfortable- RN aware Kari Baars, OT Acute Rehabilitation Services [phone] Office- [phone], Thereasa Parkin 10/05/2019, 5:11 PM

## 2019-10-05 NOTE — Progress Notes (Signed)
Returned patient's significant othes Jack Lester) phone call regarding updates.

## 2019-10-05 NOTE — Progress Notes (Signed)
Paged by RN to come to bedside and speak to family as they had decided to make pt comfort care only. NP to ICU. Upon arrival, RN informed this NP that Dr.Samtani had called and spoke to the family and family chose comfort care.  Appropriate comfort care meds placed. Pt unable to take meds safely by mouth. We will not pursue and further labs, tests, etc.  KJKG, NP [facility]

## 2019-10-05 NOTE — Progress Notes (Signed)
Fiancee arrived at bedside so this RN went in to update her. On arrival into room, I noticed that patient was not following commands, had increased work of breathing with respiratory rate in the 40s, heart rate increased to 120s and oxygen saturations in the 70s on 4L O2. Dr. Verlon Au called and came to bedside. Placed patient on nonbreather which only brought oxygen saturations up to 85-88% after 5 minutes. Patient is tracking with his eyes and responds to verbal stimulus but only with eye movement.  Conversations were started on whether the patient will remain a full code or transition to do not resuscitate. Family wants to meet together (fiancee, brother, son, daughter) before making a decision. Morphine 1mg  q2h prn was ordered to decrease work of breathing until family has made a decision on code status. Family is aware of morphine order and is agreeable to give it. Currently, the patient is on 6L nasal cannula as well as a non rebreather with oxygen saturations in the 88-90% range. Celesta Gentile states she will call at [phone] to let staff know of decision.

## 2019-10-05 NOTE — Progress Notes (Addendum)
Called to bedside by Rn re: unresponsiveness and ? wob ,? hr >20 min discussion at bedside with s/o and then with [place] and another family memedber for a conference call Dr. Earlie Server stopped by and re-iterated EOL/Hospice trajectory  He is less responsive, somewhat awake but not verbal ?encepalopathy --could be hypercarbia  They are still undecided about DNR/DNI I have strongly advocated against Full reususcitation efforts, and feel there is scant to add form further diagnostic testing  We await her decision and give some time -unclear if we can make Palliative meeting overnight as he may rapidly decline  I have d/c multiple meds not compatible with EOL trajectory and have an order in for Morphine should family decide to persue comfort trajectory  Verneita Griffes, MD [facility] 5:05 PM
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
## 2019-10-05 NOTE — Progress Notes (Signed)
MD Samtani paged per family request. MD had previously discussed with family members patient's prognosis and current decline in health status.  Family initiated contact with the MD after discussing among themselves what actions should be taken in the event that the patient expires. Patient's family members including brother (next of kin) Jack Lester and (fiance) Jack Lester requested that the patient's status be changed to DNR comfort and that the patient be made comfortable in the event of his passing.

## 2019-10-06 ENCOUNTER — Inpatient Hospital Stay: Payer: Managed Care, Other (non HMO)

## 2019-10-06 DIAGNOSIS — Z7189 Other specified counseling: Secondary | ICD-10-CM

## 2019-10-06 DIAGNOSIS — Z515 Encounter for palliative care: Secondary | ICD-10-CM

## 2019-10-06 LAB — GLUCOSE, CAPILLARY
Glucose-Capillary: 113 mg/dL — ABNORMAL HIGH (ref 70–99)
Glucose-Capillary: 79 mg/dL (ref 70–99)

## 2019-10-06 MED ORDER — GLYCOPYRROLATE 0.2 MG/ML IJ SOLN
0.2000 mg | INTRAMUSCULAR | Status: DC | PRN
  Administered 2019-10-06: 10:00:00 0.2 mg via INTRAVENOUS
  Filled 2019-10-06 (×2): qty 1

## 2019-10-06 MED ORDER — MORPHINE BOLUS VIA INFUSION
2.0000 mg | INTRAVENOUS | Status: DC | PRN
  Administered 2019-10-06 (×4): 2 mg via INTRAVENOUS
  Filled 2019-10-06: qty 2

## 2019-10-06 MED ORDER — SODIUM CHLORIDE 0.9 % IV SOLN
2.0000 mg/h | INTRAVENOUS | Status: DC
  Administered 2019-10-06: 2 mg/h via INTRAVENOUS
  Filled 2019-10-06 (×2): qty 5
  Filled 2019-10-06: qty 10

## 2019-10-06 MED ORDER — SCOPOLAMINE 1 MG/3DAYS TD PT72
1.0000 | MEDICATED_PATCH | TRANSDERMAL | Status: DC
  Administered 2019-10-06: 1.5 mg via TRANSDERMAL
  Filled 2019-10-06: qty 1

## 2019-10-06 MED ORDER — LORAZEPAM 2 MG/ML IJ SOLN
0.5000 mg | INTRAMUSCULAR | Status: DC | PRN
  Administered 2019-10-06 (×2): 0.5 mg via INTRAVENOUS
  Filled 2019-10-06 (×2): qty 1

## 2019-10-08 NOTE — ED Notes (Signed)
Verbal order for soft bilateral wrist restraints obtained from Dr. Tomi Bamberger. Restraints applied.

## 2019-10-13 ENCOUNTER — Inpatient Hospital Stay: Payer: Managed Care, Other (non HMO)

## 2019-10-19 ENCOUNTER — Ambulatory Visit: Payer: Managed Care, Other (non HMO)

## 2019-10-19 ENCOUNTER — Other Ambulatory Visit: Payer: Managed Care, Other (non HMO)

## 2019-10-19 ENCOUNTER — Ambulatory Visit: Payer: Managed Care, Other (non HMO) | Admitting: Internal Medicine

## 2019-10-20 ENCOUNTER — Ambulatory Visit: Payer: Self-pay | Admitting: Urology

## 2019-10-21 ENCOUNTER — Ambulatory Visit: Payer: Self-pay | Admitting: Urology

## 2019-11-01 NOTE — Progress Notes (Signed)
Patient restless, increased Wob, tachypnea, pulling at mask. Administered PRN ativan 0.5mg  IV per comfort care orders for agitation, and restlessness. Administered 2mg  Morphine IV for increased Wob and tachypnea.

## 2019-11-01 NOTE — Progress Notes (Signed)
Notified Dr. Neysa Bonito and pt's inability to swallow and issues with secretions, new orders to follow and will continue to monitor

## 2019-11-01 NOTE — Consult Note (Signed)
Consultation Note Date: 2019-10-30   Patient Name: Jack Lester  DOB: 02/07/1961  MRN: [id number]  Age / Sex: 59 y.o., male  PCP: Jack Perna, NP Referring Physician: Harold Hedge, MD  Reason for Consultation: Terminal Care  HPI/Patient Profile: 59 y.o. 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  Patient sees Dr. Earlie Lester from oncology in the outpatient setting.  Recently received treatment for lung cancer 1 cycle.  Also received SRS for metastatic brain lesions, palliative radiation to painful bony lesions.  Patient has had ongoing functional and cognitive decline.  Oral intake has become essentially minimal.  Hence, on 10-05-2019, patient was seen and evaluated by hospital medicine service as well as medical oncology and ultimately family elected to proceed with comfort measures.  Palliative consult continues.  Jack Lester is resting in bed.  He is using nonrebreather mask, accessory muscles of respiration.  He appears to been at least mild-moderate degree of discomfort.  Chart reviewed, patient seen and examined, medications reviewed, discussed with bedside RN.  Palliative medicine is specialized medical care for people living with serious illness. It focuses on providing relief from the symptoms and stress of a serious illness. The goal is to improve quality of life for both the patient and the family.  Goals of care: Broad aims of medical therapy in relation to the patient's values and preferences. Our aim is to provide medical care aimed at enabling patients to achieve the goals that matter most to them, given the circumstances of their particular medical  situation and their constraints.   Please note additional changes as mentioned in the summary of recommendations.  Thank you for the consult.  NEXT OF KIN Has fianc Jack Lester and brother Jack Lester.   SUMMARY OF RECOMMENDATIONS   Agree with DO NOT RESUSCITATE Agree with comfort measures only: Will maximize comfort measures, augment current medication regimen.  Medication history reviewed, chart reviewed, discussed with bedside RN.  Will start low-dose morphine continuous infusion.  We will discontinue interventions not contributing towards comfort.  Will add Robinul IV as needed, will change Ativan dose to be available more frequently. Chaplain consult, comfort cart when family arrives, anticipate hospital death.  Maximize comfort measures, at present, do not feel that the patient is stable for transfer to hospice facility.  Prognosis likely hours to some very limited number of days.  Code Status/Advance Care Planning:  DNR    Symptom Management:   As above  Palliative Prophylaxis:   Delirium Protocol  Additional Recommendations (Limitations, Scope, Preferences):  Full Comfort Care  Psycho-social/Spiritual:   Desire for further Chaplaincy support:yes  Additional Recommendations: Caregiving  Support/Resources  Prognosis:   Hours - Days  Discharge Planning: Anticipated Hospital Death      Primary Diagnoses: Present on Admission: . Acute respiratory failure with hypoxia (  [place])   I have reviewed the medical record, interviewed the patient and family, and examined the patient. The following aspects are pertinent.  Past Medical History:  Diagnosis Date  . Back pain   . Bloating   . Cancer [facility])    second primary lung ca?  . Change in bowel habits   . Constipation   . COPD (chronic obstructive pulmonary disease) ([place])   . Cough   . Diarrhea   . Fecal incontinence   . GERD (gastroesophageal reflux disease)   . Hypertension   . Loss of appetite   . Loss of  weight   . Night sweats   . Rectal bleeding   . Rectal cancer ([place])   . Shortness of breath    Social History   Socioeconomic History  . Marital status: Significant Other    Spouse name: Jack Lester  . Number of children: 2  . Years of education: Not on file  . Highest education level: Not on file  Occupational History  . Not on file  Tobacco Use  . Smoking status: Current Every Day Smoker    Packs/day: 0.25    Years: 40.00    Pack years: 10.00    Types: Cigarettes  . Smokeless tobacco: Never Used  . Tobacco comment: smoking cessation  Substance and Sexual Activity  . Alcohol use: Yes    Comment: occasional  . Drug use: No  . Sexual activity: Not Currently  Other Topics Concern  . Not on file  Social History Narrative  . Not on file   Social Determinants of Health   Financial Resource Strain:   . Difficulty of Paying Living Expenses: Not on file  Food Insecurity:   . Worried About Charity fundraiser in the Last Year: Not on file  . Ran Out of Food in the Last Year: Not on file  Transportation Needs:   . Lack of Transportation (Medical): Not on file  . Lack of Transportation (Non-Medical): Not on file  Physical Activity:   . Days of Exercise per Week: Not on file  . Minutes of Exercise per Session: Not on file  Stress:   . Feeling of Stress : Not on file  Social Connections:   . Frequency of Communication with Friends and Family: Not on file  . Frequency of Social Gatherings with Friends and Family: Not on file  . Attends Religious Services: Not on file  . Active Member of Clubs or Organizations: Not on file  . Attends Archivist Meetings: Not on file  . Marital Status: Not on file   Family History  Problem Relation Age of Onset  . Heart attack Mother   . Colon cancer Father        dx thinks early 60's  . Esophageal cancer Neg Hx   . Rectal cancer Neg Hx   . Stomach cancer Neg Hx    Scheduled Meds: . Chlorhexidine Gluconate Cloth  6 each  Topical Daily  . fluconazole  100 mg Oral Daily  . metoprolol tartrate  25 mg Oral BID  . nystatin  5 mL Mouth/Throat QID  . predniSONE  40 mg Oral QAC breakfast  . scopolamine  1 patch Transdermal Q72H   Continuous Infusions: .  ceFAZolin (ANCEF) IV 2 g (10/10/19 0908)  . lactated ringers 50 mL/hr at 10/10/19 0820  . morphine     PRN Meds:.albuterol, glycopyrrolate, lip balm, LORazepam, morphine **AND** morphine Medications Prior to Admission:  Prior to Admission  medications   Medication Sig Start Date End Date Taking? Authorizing Provider  amLODipine (NORVASC) 10 MG tablet TAKE 1 TABLET BY MOUTH EVERY DAY Patient taking differently: Take 10 mg by mouth daily.  08/16/19  Yes Newlin, Charlane Ferretti, MD  dexamethasone (DECADRON) 4 MG tablet Take 1 tablet (4 mg total) by mouth 2 (two) times daily. Patient taking differently: Take 4 mg by mouth 2 (two) times daily. On 09/15/2019 patient was instructed to begin tapering off decadron. Instructed patient to begin taking one 4 mg tablet once per day for 7 days, then 1/2 tablet daily x 7 days, then 1/2 tablet every other day for 5 days and STOP. 08/20/19  Yes Bruning, Ashlyn, PA-C  docusate sodium (COLACE) 100 MG capsule Take 1 capsule (100 mg total) by mouth daily. 08/25/19  Yes Donne Hazel, MD  fluconazole (DIFLUCAN) 100 MG tablet Take 1 tablet (100 mg total) by mouth daily. 09/15/19  Yes Heilingoetter, Cassandra L, PA-C  Fluticasone-Salmeterol (ADVAIR) 100-50 MCG/DOSE AEPB Inhale 1 puff into the lungs 2 (two) times daily. 09/08/19  Yes Jack Perna, NP  folic acid (FOLVITE) 1 MG tablet Take 1 tablet (1 mg total) by mouth daily. 08/25/19  Yes Heilingoetter, Cassandra L, PA-C  furosemide (LASIX) 20 MG tablet Take 1 tablet (20 mg total) by mouth daily. 08/25/19 09/22/2019 Yes Donne Hazel, MD  ibuprofen (ADVIL) 800 MG tablet Take 1 tablet (800 mg total) by mouth 3 (three) times daily. 07/12/19  Yes Edwards, Milford Cage, NP  ipratropium-albuterol  (DUONEB) 0.5-2.5 (3) MG/3ML SOLN Take 3 mLs by nebulization every 4 (four) hours as needed. 08/24/19  Yes Donne Hazel, MD  magic mouthwash SOLN Take 5 mLs by mouth 4 (four) times daily as needed for mouth pain (swish in mouth and spit or swallow before meals and before bed as needed). 09/15/19  Yes Bruning, Ashlyn, PA-C  metoprolol tartrate (LOPRESSOR) 25 MG tablet Take 1 tablet (25 mg total) by mouth 2 (two) times daily. 09/17/19  Yes Jack Perna, NP  omeprazole (PRILOSEC) 40 MG capsule Take 1 capsule (40 mg total) by mouth daily. 06/24/19  Yes Armbruster, Carlota Raspberry, MD  oxyCODONE-acetaminophen (PERCOCET/ROXICET) 5-325 MG tablet Take 1 tablet by mouth every 6 (six) hours as needed for severe pain. 09/28/19  Yes Curt Bears, MD  prochlorperazine (COMPAZINE) 10 MG tablet Take 1 tablet (10 mg total) by mouth every 6 (six) hours as needed for nausea or vomiting. 08/25/19  Yes Heilingoetter, Cassandra L, PA-C  Albuterol Sulfate (PROAIR RESPICLICK) 097 (90 Base) MCG/ACT AEPB Inhale 1-2 puffs into the lungs every 6 (six) hours as needed. 09/17/19   Jack Perna, NP   No Known Allergies Review of Systems Nonverbal Physical Exam Cachectic gentleman At least mild-moderate degree of distress Is on nonrebreather mask Is thin and has muscle wasting No edema Is using accessory muscles of respiration Somewhat awake but not alert Abdomen is not distended  Vital Signs: BP (!) 154/91   Pulse (!) 125   Temp 98.4 F (36.9 C) (Axillary)   Resp (!) 44   Ht 5\' 9"  (1.753 m)   Wt 43.1 kg   SpO2 91%   BMI 14.03 kg/m  Pain Scale: CPOT   Pain Score: 0-No pain   SpO2: SpO2: 91 % O2 Device:SpO2: 91 % O2 Flow Rate: .O2 Flow Rate (L/min): 15 L/min  IO: Intake/output summary:   Intake/Output Summary (Last 24 hours) at 11/05/2019 1005 Last data filed at 05-Nov-2019 0820 Gross per 24 hour  Intake 1678.87 ml  Output 1200 ml  Net 478.87 ml    LBM: Last BM Date: (passing gas but last bowel  movement is unknown per patient) Baseline Weight: Weight: 49.9 kg Most recent weight: Weight: 43.1 kg     Palliative Assessment/Data:   Palliative performance scale 10%  Time In:  9 Time Out:10   Time Total: 60  Greater than 50%  of this time was spent counseling and coordinating care related to the above assessment and plan.  Signed by: Loistine Chance, MD   Please contact Palliative Medicine Team phone at [phone] for questions and concerns.  For individual provider: See Shea Evans

## 2019-11-01 NOTE — Progress Notes (Deleted)
PROGRESS NOTE    Jack Lester    Code Status: DNR  VXB:[id number] DOB: Apr 01, 1961 DOA: 09/11/2019  PCP: Kerin Perna, NP    Hospital Summary  This is a 59 year old male with history of COPD, rectal adenocarcinoma, stage IV NSCLC with recent admission from 12/19-22 for acute respiratory failure readmitted by critical care 1/28 for severe respiratory acidosis and hypoxic respiratory failure and intubated 1/28-31.  Found to have Citrobacter UTI and staph aureus sputum culture.  Since been evaluated by oncology and palliative care.  Patient had episode of unresponsiveness and increased work of breathing on evening 2/2 and family agreed to transition to Therapist, occupational.  A & P   Active Problems:   Acute respiratory failure with hypoxia (HCC)   Protein-calorie malnutrition, severe   Pressure injury of skin   1. Sepsis secondary to staph aureus and Citrobacter koseri sputum culture 2. Oral thrush 3. Cute on chronic hypoxic respiratory failure with respiratory acidosis 4. Stage IV NSCLC and rectal cancer 5. Stage II pressure injury 6. Severe cachexia 7. Hypertension 8. Sinus tachycardia 9. AKI 10. Hypokalemia 11. Hypoglycemia    Disposition Plan: Continue with comfort measures and treatment per palliative care/comfort measures regimen.  Anticipate hospital death next 24 to 48 hours  Consultants  Oncology PCCM Palliative care  Procedures  Intubate 1/28-> 1/31  Antibiotics   Anti-infectives (From admission, onward)   Start     Dose/Rate Route Frequency Ordered Stop   10/05/19 1500  fluconazole (DIFLUCAN) tablet 100 mg     100 mg Oral Daily 10/05/19 1356     10/03/19 1800  ceFAZolin (ANCEF) IVPB 2g/100 mL premix     2 g 200 mL/hr over 30 Minutes Intravenous Every 8 hours 10/03/19 1300     10/03/19 0600  vancomycin (VANCOREADY) IVPB 750 mg/150 mL  Status:  Discontinued     750 mg 150 mL/hr over 60 Minutes Intravenous Every 36 hours 10/02/19 0934 10/03/19 1243   10/02/19 0800  ceFEPIme (MAXIPIME) 2 g in sodium chloride 0.9 % 100 mL IVPB  Status:  Discontinued     2 g 200 mL/hr over 30 Minutes Intravenous Every 12 hours 10/01/19 1716 10/03/19 1243   10/01/19 1800  vancomycin (VANCOREADY) IVPB 750 mg/150 mL  Status:  Discontinued     750 mg 150 mL/hr over 60 Minutes Intravenous Every 24 hours 10/01/19 1705 10/02/19 0934   10/01/19 1700  vancomycin (VANCOCIN) IVPB 1000 mg/200 mL premix  Status:  Discontinued     1,000 mg 200 mL/hr over 60 Minutes Intravenous Every 24 hours 09/17/2019 1808 10/01/19 1703   10/01/19 0200  ceFEPIme (MAXIPIME) 2 g in sodium chloride 0.9 % 100 mL IVPB  Status:  Discontinued     2 g 200 mL/hr over 30 Minutes Intravenous Every 8 hours 09/06/2019 1808 10/01/19 1716   09/21/2019 1545  vancomycin (VANCOCIN) IVPB 1000 mg/200 mL premix     1,000 mg 200 mL/hr over 60 Minutes Intravenous  Once 09/20/2019 1534 09/29/2019 1830   09/09/2019 1530  ceFEPIme (MAXIPIME) 1 g in sodium chloride 0.9 % 100 mL IVPB     1 g 200 mL/hr over 30 Minutes Intravenous  Once 09/16/2019 1526 09/22/2019 1716           Subjective   Patient is resting comfortably, not responsive  Objective   Vitals:   12-Oct-2019 0900 10-12-19 1000 10/12/2019 1100 10/12/19 1200  BP: (!) 154/91 127/85 (!) 124/91 119/75  Pulse:  (!) 132  Resp: (!) 44 (!) 41 (!) 39 (!) 39  Temp:      TempSrc:      SpO2:  94%    Weight:      Height:        Intake/Output Summary (Last 24 hours) at 10/14/19 1648 Last data filed at 2019-10-14 1413 Gross per 24 hour  Intake 1214.99 ml  Output 1300 ml  Net -85.01 ml   Filed Weights   10/03/19 0500 10/04/19 0500 10/05/19 0436  Weight: 44.9 kg 44.2 kg 43.1 kg    Examination:  Physical Exam Vitals and nursing note reviewed.  Constitutional:      Interventions: He is not intubated.    Comments: Sleeping, nonresponsive to verbal stimuli  Eyes:     Comments: Closed  Cardiovascular:     Rate and Rhythm: Regular rhythm. Tachycardia  present.  Pulmonary:     Effort: Tachypnea present. He is not intubated.  Abdominal:     Palpations: Abdomen is soft.     Tenderness: There is no abdominal tenderness.  Musculoskeletal:     Right lower leg: No tenderness. No edema.     Left lower leg: No tenderness. No edema.     Data Reviewed: I have personally reviewed following labs and imaging studies  CBC: Recent Labs  Lab 09/07/2019 1830 09/03/2019 1830 10/01/19 0243 10/02/19 0519 10/03/19 0218 10/04/19 0347 10/05/19 0300  WBC 21.4*   < > 14.5* 27.3* 30.1* 38.6* 38.1*  NEUTROABS 19.7*  --   --   --   --   --   --   HGB 11.0*   < > 9.0* 8.7* 8.3* 9.8* 8.3*  HCT 32.7*   < > 25.7* 25.6* 24.2* 29.5* 24.6*  MCV 98.5   < > 98.1 98.8 96.8 99.0 100.0  PLT 290   < > 227 254 223 244 222   < > = values in this interval not displayed.   Basic Metabolic Panel: Recent Labs  Lab 10/01/19 0243 10/01/19 0243 10/01/19 0950 10/01/19 1640 10/02/19 0519 10/02/19 1704 10/03/19 0218 10/04/19 0347 10/05/19 0300  NA 128*  --   --   --  132*  --  136 143 145  K 4.5  --   --   --  4.5  --  3.5 2.9* 3.3*  CL 90*  --   --   --  95*  --  99 103 108  CO2 23  --   --   --  23  --  24 24 27   GLUCOSE 89  --   --   --  125*  --  154* 95 119*  BUN 37*  --   --   --  68*  --  74* 52* 58*  CREATININE 1.14  --   --   --  1.47*  --  1.01 0.63 0.73  CALCIUM 7.7*  --   --   --  8.0*  --  8.2* 8.4* 8.4*  MG 2.1   < > 2.1 2.3 2.3 2.5*  --  2.3  --   PHOS 5.8*  --  6.0* 6.0* 4.7* 3.0  --   --   --    < > = values in this interval not displayed.   GFR: Estimated Creatinine Clearance: 62.1 mL/min (by C-G formula based on SCr of 0.73 mg/dL). Liver Function Tests: Recent Labs  Lab 10/03/2019 1509 09/09/2019 1830 10/02/19 0519  AST 27 111* 71*  ALT 18 50* 45*  ALKPHOS  143* 108 85  BILITOT 0.5 0.9 0.5  PROT 7.3 6.0* 5.9*  ALBUMIN 2.1* 1.6* 1.8*   Recent Labs  Lab 09/03/2019 1830  LIPASE 13  AMYLASE 187*   No results for input(s): AMMONIA in the  last 168 hours. Coagulation Profile: Recent Labs  Lab 09/13/2019 1830  INR 1.1   Cardiac Enzymes: No results for input(s): CKTOTAL, CKMB, CKMBINDEX, TROPONINI in the last 168 hours. BNP (last 3 results) No results for input(s): PROBNP in the last 8760 hours. HbA1C: No results for input(s): HGBA1C in the last 72 hours. CBG: Recent Labs  Lab 10/05/19 0821 10/05/19 1131 10/05/19 1233 10/05/19 1635 2019/10/28 0921  GLUCAP 113* 59* 114* 113* 79   Lipid Profile: No results for input(s): CHOL, HDL, LDLCALC, TRIG, CHOLHDL, LDLDIRECT in the last 72 hours. Thyroid Function Tests: No results for input(s): TSH, T4TOTAL, FREET4, T3FREE, THYROIDAB in the last 72 hours. Anemia Panel: No results for input(s): VITAMINB12, FOLATE, FERRITIN, TIBC, IRON, RETICCTPCT in the last 72 hours. Sepsis Labs: Recent Labs  Lab 09/21/2019 1830  PROCALCITON 33.80  LATICACIDVEN 3.0*    Recent Results (from the past 240 hour(s))  Respiratory Panel by RT PCR (Flu A&B, Covid) - Nasopharyngeal Swab     Status: None   Collection Time: 09/06/2019  3:43 PM   Specimen: Nasopharyngeal Swab  Result Value Ref Range Status   SARS Coronavirus 2 by RT PCR NEGATIVE NEGATIVE Final    Comment: (NOTE) SARS-CoV-2 target nucleic acids are NOT DETECTED. The SARS-CoV-2 RNA is generally detectable in upper respiratoy specimens during the acute phase of infection. The lowest concentration of SARS-CoV-2 viral copies this assay can detect is 131 copies/mL. A negative result does not preclude SARS-Cov-2 infection and should not be used as the sole basis for treatment or other patient management decisions. A negative result may occur with  improper specimen collection/handling, submission of specimen other than nasopharyngeal swab, presence of viral mutation(s) within the areas targeted by this assay, and inadequate number of viral copies (<131 copies/mL). A negative result must be combined with clinical observations, patient  history, and epidemiological information. The expected result is Negative. Fact Sheet for Patients:  PinkCheek.be Fact Sheet for Healthcare Providers:  GravelBags.it This test is not yet ap proved or cleared by the [place] FDA and  has been authorized for detection and/or diagnosis of SARS-CoV-2 by FDA under an Emergency Use Authorization (EUA). This EUA will remain  in effect (meaning this test can be used) for the duration of the COVID-19 declaration under Section 564(b)(1) of the Act, 21 U.S.C. section 360bbb-3(b)(1), unless the authorization is terminated or revoked sooner.    Influenza A by PCR NEGATIVE NEGATIVE Final   Influenza B by PCR NEGATIVE NEGATIVE Final    Comment: (NOTE) The Xpert Xpress SARS-CoV-2/FLU/RSV assay is intended as an aid in  the diagnosis of influenza from Nasopharyngeal swab specimens and  should not be used as a sole basis for treatment. Nasal washings and  aspirates are unacceptable for Xpert Xpress SARS-CoV-2/FLU/RSV  testing. Fact Sheet for Patients: PinkCheek.be Fact Sheet for Healthcare Providers: GravelBags.it This test is not yet approved or cleared by the [place] FDA and  has been authorized for detection and/or diagnosis of SARS-CoV-2 by  FDA under an Emergency Use Authorization (EUA). This EUA will remain  in effect (meaning this test can be used) for the duration of the  Covid-19 declaration under Section 564(b)(1) of the Act, 21  U.S.C. section 360bbb-3(b)(1), unless the authorization is  terminated or revoked. Performed at [facility], [place] [address], [place], [place] [address]   Respiratory Panel by PCR     Status: None   Collection Time: 09/09/2019  3:43 PM   Specimen: Nasopharyngeal Swab; Respiratory  Result Value Ref Range Status   Adenovirus NOT DETECTED NOT DETECTED Final   Coronavirus  229E NOT DETECTED NOT DETECTED Final    Comment: (NOTE) The Coronavirus on the Respiratory Panel, DOES NOT test for the novel  Coronavirus (2019 nCoV)    Coronavirus HKU1 NOT DETECTED NOT DETECTED Final   Coronavirus NL63 NOT DETECTED NOT DETECTED Final   Coronavirus OC43 NOT DETECTED NOT DETECTED Final   Metapneumovirus NOT DETECTED NOT DETECTED Final   Rhinovirus / Enterovirus NOT DETECTED NOT DETECTED Final   Influenza A NOT DETECTED NOT DETECTED Final   Influenza B NOT DETECTED NOT DETECTED Final   Parainfluenza Virus 1 NOT DETECTED NOT DETECTED Final   Parainfluenza Virus 2 NOT DETECTED NOT DETECTED Final   Parainfluenza Virus 3 NOT DETECTED NOT DETECTED Final   Parainfluenza Virus 4 NOT DETECTED NOT DETECTED Final   Respiratory Syncytial Virus NOT DETECTED NOT DETECTED Final   Bordetella pertussis NOT DETECTED NOT DETECTED Final   Chlamydophila pneumoniae NOT DETECTED NOT DETECTED Final   Mycoplasma pneumoniae NOT DETECTED NOT DETECTED Final    Comment: Performed at [facility], [place]. [address] [place], [place] [address]  Blood culture (routine x 2)     Status: None   Collection Time: 09/06/2019  4:20 PM   Specimen: BLOOD LEFT HAND  Result Value Ref Range Status   Specimen Description BLOOD LEFT HAND  Final   Special Requests   Final    BOTTLES DRAWN AEROBIC AND ANAEROBIC Blood Culture results may not be optimal due to an inadequate volume of blood received in culture bottles Performed at [facility], [place] [address] [place], [place] [address]    Culture NO GROWTH 5 DAYS  Final   Report Status 10/05/2019 FINAL  Final  Blood culture (routine x 2)     Status: None   Collection Time: 09/03/2019  4:30 PM   Specimen: BLOOD  Result Value Ref Range Status   Specimen Description BLOOD RIGHT ANTECUBITAL  Final   Special Requests   Final    BOTTLES DRAWN AEROBIC ONLY Blood Culture results may not be optimal due to an inadequate volume of blood received in  culture bottles Performed at [facility], [place] [address], [place], [place] [address]    Culture NO GROWTH 5 DAYS  Final   Report Status 10/05/2019 FINAL  Final  Urine culture     Status: Abnormal   Collection Time: 09/19/2019  6:30 PM   Specimen: Urine, Random  Result Value Ref Range Status   Specimen Description   Final    URINE, RANDOM Performed at [place] [address] [place], [place] [address]    Special Requests   Final    NONE Performed at [facility], [place] [address], [place], [place] [address]    Culture >=100,000 COLONIES/mL CITROBACTER KOSERI (A)  Final   Report Status 10/02/2019 FINAL  Final   Organism ID, Bacteria CITROBACTER KOSERI (A)  Final      Susceptibility   Citrobacter koseri - MIC*    CEFAZOLIN <=4 SENSITIVE Sensitive     CEFTRIAXONE <=0.25 SENSITIVE Sensitive     CIPROFLOXACIN <=0.25 SENSITIVE Sensitive     GENTAMICIN <=  1 SENSITIVE Sensitive     IMIPENEM <=0.25 SENSITIVE Sensitive     NITROFURANTOIN <=16 SENSITIVE Sensitive     TRIMETH/SULFA <=20 SENSITIVE Sensitive     PIP/TAZO <=4 SENSITIVE Sensitive     * >=100,000 COLONIES/mL CITROBACTER KOSERI  MRSA PCR Screening     Status: None   Collection Time: 09/24/2019  8:28 PM   Specimen: Nasopharyngeal  Result Value Ref Range Status   MRSA by PCR NEGATIVE NEGATIVE Final    Comment:        The GeneXpert MRSA Assay (FDA approved for NASAL specimens only), is one component of a comprehensive MRSA colonization surveillance program. It is not intended to diagnose MRSA infection nor to guide or monitor treatment for MRSA infections. Performed at [facility], [place] [address], [place], [place] [address]   Culture, blood (routine x 2)     Status: None   Collection Time: 09/29/2019  8:39 PM   Specimen: BLOOD  Result Value Ref Range Status   Specimen Description BLOOD BLOOD RIGHT ARM  Final   Special Requests   Final     BOTTLES DRAWN AEROBIC AND ANAEROBIC Blood Culture adequate volume Performed at [place] [address] [place], [place] [address]    Culture NO GROWTH 5 DAYS  Final   Report Status 10/05/2019 FINAL  Final  Culture, blood (routine x 2)     Status: None   Collection Time: 09/27/2019  8:40 PM   Specimen: BLOOD  Result Value Ref Range Status   Specimen Description BLOOD BLOOD RIGHT ARM  Final   Special Requests   Final    BOTTLES DRAWN AEROBIC AND ANAEROBIC Blood Culture adequate volume Performed at [place] [address] [place], [place] [address]    Culture NO GROWTH 5 DAYS  Final   Report Status 10/05/2019 FINAL  Final  Culture, respiratory (non-expectorated)     Status: None   Collection Time: 10/01/19 10:07 AM   Specimen: Tracheal Aspirate; Respiratory  Result Value Ref Range Status   Specimen Description   Final    TRACHEAL ASPIRATE Performed at [place] [address] [place], [place] [address]    Special Requests   Final    Normal Performed at [facility], [place] [address] [place], [place] [address]    Gram Stain   Final    ABUNDANT WBC PRESENT, PREDOMINANTLY PMN MODERATE GRAM NEGATIVE RODS FEW GRAM POSITIVE COCCI RARE GRAM POSITIVE RODS Performed at [facility], [place] [address], [place], [place] [address]    Culture   Final    ABUNDANT CITROBACTER KOSERI ABUNDANT STAPHYLOCOCCUS AUREUS    Report Status 10/04/2019 FINAL  Final   Organism ID, Bacteria STAPHYLOCOCCUS AUREUS  Final   Organism ID, Bacteria CITROBACTER KOSERI  Final      Susceptibility   Citrobacter koseri - MIC*    CEFAZOLIN <=4 SENSITIVE Sensitive     CEFEPIME <=0.12 SENSITIVE Sensitive     CEFTAZIDIME <=1 SENSITIVE Sensitive     CEFTRIAXONE <=0.25 SENSITIVE Sensitive     CIPROFLOXACIN <=0.25 SENSITIVE Sensitive     GENTAMICIN <=1 SENSITIVE Sensitive     IMIPENEM <=0.25 SENSITIVE Sensitive      TRIMETH/SULFA <=20 SENSITIVE Sensitive     PIP/TAZO <=4 SENSITIVE Sensitive     * ABUNDANT CITROBACTER KOSERI   Staphylococcus aureus - MIC*    CIPROFLOXACIN 2 INTERMEDIATE Intermediate     ERYTHROMYCIN RESISTANT Resistant  GENTAMICIN <=0.5 SENSITIVE Sensitive     OXACILLIN 0.5 SENSITIVE Sensitive     TETRACYCLINE <=1 SENSITIVE Sensitive     VANCOMYCIN <=0.5 SENSITIVE Sensitive     TRIMETH/SULFA <=10 SENSITIVE Sensitive     CLINDAMYCIN RESISTANT Resistant     RIFAMPIN <=0.5 SENSITIVE Sensitive     Inducible Clindamycin POSITIVE Resistant     * ABUNDANT STAPHYLOCOCCUS AUREUS         Radiology Studies: DG Chest 1 View  Result Date: 10/05/2019 CLINICAL DATA:  Pneumonia. History of rectal cancer, COPD, hypertension. Question secondary lung cancer. History of smoking. EXAM: CHEST  1 VIEW COMPARISON:  Chest x-ray 10/03/2019.  CT 09/03/2019. FINDINGS: Interim extubation and removal of NG tube. Heart size stable. Persistent diffuse bilateral pulmonary infiltrates and right-sided pleural effusion. Left costophrenic angle incompletely imaged. No pneumothorax. Prior cervical spine fusion. IMPRESSION: 1.  Interim extubation and removal of NG tube. 2. Persistent severe bilateral pulmonary infiltrates without interim change. Stable small right pleural effusion. Electronically Signed   By: [place]   On: 10/05/2019 16:05        Scheduled Meds: . Chlorhexidine Gluconate Cloth  6 each Topical Daily  . fluconazole  100 mg Oral Daily  . metoprolol tartrate  25 mg Oral BID  . nystatin  5 mL Mouth/Throat QID  . predniSONE  40 mg Oral QAC breakfast  . scopolamine  1 patch Transdermal Q72H   Continuous Infusions: .  ceFAZolin (ANCEF) IV Stopped (11-02-19 1019)  . lactated ringers Stopped (11/02/2019 1107)  . morphine 2 mg/hr (11-02-19 1413)     LOS: 6 days    Time spent: 20 minutes with over 50% of the time coordinating the patient's care    Harold Hedge, DO [facility] Pager [phone]  If 7PM-7AM, please contact night-coverage [email or web address] Password [facility] 11/02/19, 4:48 PM

## 2019-11-01 NOTE — Progress Notes (Signed)
   November 02, 2019 1300  Clinical Encounter Type  Visited With Family;Health care provider  Visit Type Initial;Psychological support;Spiritual support  Referral From Nurse;Palliative care team  Consult/Referral To Chaplain  Spiritual Encounters  Spiritual Needs Emotional;Other (Comment) (Spiritual Care Conversation/Support)  Stress Factors  Patient Stress Factors Not reviewed  Family Stress Factors Health changes;Loss;Major life changes   I spoke with Mr. [phone] nurse. I also spoke with Mr. Thain fiance, [name].  [name] talked to me about Mr. Vanbergen illness. Her main concern is that he isn't in pain. She wants the medical team to keep him comfortable. [name] said that her daughter is having a health crisis as well, and that this makes it even harder for her, as she is her caregiver.  [name] said that she and Mr. Watchman have been together for over 20 years.  She also let me know that Mr. Ishmael brother from [place] is on his way to see him today. He also has a son, daughter and sister that will be coming to visit as well.   I will continue to follow up.   Please, contact Spiritual Care for further assistance.   Chaplain Shanon Ace M.Div., [facility]

## 2019-11-01 NOTE — Progress Notes (Signed)
OT Cancellation Note  Patient Details Name: Jack Lester MRN: [id number] DOB: Oct 02, 1960   Cancelled Treatment:    Reason Eval/Treat Not Completed: Other (comment). Pt is now comfort care. Will sign off.  Toyoko Silos 10/22/2019, 7:37 AM  Karsten Ro, OTR/L Acute Rehabilitation Services 10/22/2019

## 2019-11-01 NOTE — Progress Notes (Signed)
Wasted Morphine drip in steri-cycle with Sharmon Leyden RN. Total amount was 270ml out of bag.

## 2019-11-01 NOTE — Progress Notes (Signed)
Saline (3 cc each) to each eye, moist gauze and tape over each eye at 2010.  All belongings home with brother at 2.

## 2019-11-01 NOTE — Progress Notes (Signed)
Notified [name] of patient's belongings being sent home with brother Casimiro Lienhard.

## 2019-11-01 NOTE — Progress Notes (Signed)
PT Cancellation Note  Patient Details Name: Jack Lester MRN: [id number] DOB: Aug 16, 1961   Cancelled Treatment:    Reason Eval/Treat Not Completed: Other (comment) Pt now comfort care, RN states for therapy to sign off.  Order also discontinued.  PT signing off.   Giovannie Scerbo,KATHrine E 10-23-2019, 11:33 AM Arlyce Dice, DPT Acute Rehabilitation Services Office: [phone]

## 2019-11-01 NOTE — Death Summary Note (Signed)
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 CITROBACTER KOSERI   Staphylococcus aureus - MIC*    CIPROFLOXACIN 2 INTERMEDIATE Intermediate     ERYTHROMYCIN RESISTANT Resistant     GENTAMICIN <=0.5 SENSITIVE Sensitive     OXACILLIN 0.5 SENSITIVE Sensitive     TETRACYCLINE <=1 SENSITIVE Sensitive     VANCOMYCIN <=0.5 SENSITIVE Sensitive     TRIMETH/SULFA <=10 SENSITIVE Sensitive     CLINDAMYCIN RESISTANT Resistant     RIFAMPIN <=0.5 SENSITIVE Sensitive     Inducible Clindamycin POSITIVE Resistant     * ABUNDANT STAPHYLOCOCCUS AUREUS    Lab Basic Metabolic Panel: Recent Labs  Lab 10/01/19 0243 10/01/19 0243 10/01/19 0950 10/01/19 1640 10/02/19 0519 10/02/19 1704 10/03/19 0218 10/04/19 0347 10/05/19 0300  NA 128*  --   --   --  132*  --  136 143 145  K 4.5  --   --   --  4.5  --  3.5 2.9* 3.3*  CL 90*  --   --   --  95*  --  99 103 108  CO2 23  --   --   --   23  --  24 24 27   GLUCOSE 89  --   --   --  125*  --  154* 95 119*  BUN 37*  --   --   --  68*  --  74* 52* 58*  CREATININE 1.14  --   --   --  1.47*  --  1.01 0.63 0.73  CALCIUM 7.7*  --   --   --  8.0*  --  8.2* 8.4* 8.4*  MG 2.1   < > 2.1 2.3 2.3 2.5*  --  2.3  --   PHOS 5.8*  --  6.0* 6.0* 4.7* 3.0  --   --   --    < > = values in this interval not displayed.   Liver Function Tests: Recent Labs  Lab 09/20/2019 1509 10/02/2019 1830 10/02/19 0519  AST 27 111* 71*  ALT 18 50* 45*  ALKPHOS 143* 108 85  BILITOT 0.5 0.9 0.5  PROT 7.3 6.0* 5.9*  ALBUMIN 2.1* 1.6* 1.8*   Recent Labs  Lab 09/04/2019 1830  LIPASE 13  AMYLASE 187*   No results for input(s): AMMONIA in the last 168 hours. CBC: Recent Labs  Lab 09/21/2019 1830 09/27/2019 1830 10/01/19 0243 10/02/19 0519 10/03/19 0218 10/04/19 0347 10/05/19 0300  WBC 21.4*   < > 14.5* 27.3* 30.1* 38.6* 38.1*  NEUTROABS 19.7*  --   --   --   --   --   --   HGB 11.0*   < > 9.0* 8.7* 8.3* 9.8* 8.3*  HCT 32.7*   < > 25.7* 25.6* 24.2* 29.5* 24.6*  MCV 98.5   < > 98.1 98.8 96.8 99.0 100.0  PLT 290   < > 227 254 223 244 222   < > = values in this interval not displayed.   Cardiac Enzymes: No results for input(s): CKTOTAL, CKMB, CKMBINDEX, TROPONINI in the last 168 hours. Sepsis Labs: Recent Labs  Lab 09/19/2019 1830 10/01/19 0243 10/02/19 0519 10/03/19 0218 10/04/19 0347 10/05/19 0300  PROCALCITON 33.80  --   --   --   --   --   WBC 21.4*   < > 27.3* 30.1* 38.6* 38.1*  LATICACIDVEN 3.0*  --   --   --   --   --    < > = values in this interval not displayed.    Procedures/Operations  ETT 1/28 >> 1/31   Harold Hedge 2019/10/31, 6:07 PM

## 2019-11-01 NOTE — Discharge Summary (Signed)
Physician Discharge Summary  Jack Lester CHE:[id number] DOB: 10/12/1960 DOA: 09/13/2019  PCP: Kerin Perna, NP  Admit date: 09/29/2019 Discharge date: 02-Nov-2019   Code Status: DNR  Discharge Condition:deceased   Hospital Summary  Jack Lester is a 59 y.o. year old male with a history of COPD, rectal adenocarcinoma, stage IV NSCLC with recent admission from 12/19-22 for acute respiratory failure readmitted by critical care 1/28 for severe respiratory acidosis and hypoxic respiratory failure and intubated 1/28-31. Found to have Citrobacter UTI and staph aureus sputum culture. Since been evaluated by oncology and palliative care. Patient had episode of unresponsiveness and increased work of breathing on evening 2/2 and family agreed to transition to comfort measures 2/2.  Patient succumbed to his illness and passed away on 02-Nov-2019  A & P   Active Problems:   Acute respiratory failure with hypoxia (HCC)   Protein-calorie malnutrition, severe   Pressure injury of skin    1. Sepsis secondary to staph aureus and Citrobacter koseri sputum culture 2. Oral thrush 3. Cute on chronic hypoxic respiratory failure with respiratory acidosis 4. Stage IV NSCLC and rectal cancer 5. Stage II pressure injury 6. Severe cachexia 7. Hypertension 8. Sinus tachycardia 9. AKI 10. Hypokalemia 11. Hypoglycemia    Consultants  Oncology PCCM Palliative care   Procedures  Intubate 1/28-> 1/31  Antibiotics   Anti-infectives (From admission, onward)   Start     Dose/Rate Route Frequency Ordered Stop   10/05/19 1500  fluconazole (DIFLUCAN) tablet 100 mg     100 mg Oral Daily 10/05/19 1356     10/03/19 1800  ceFAZolin (ANCEF) IVPB 2g/100 mL premix     2 g 200 mL/hr over 30 Minutes Intravenous Every 8 hours 10/03/19 1300     10/03/19 0600  vancomycin (VANCOREADY) IVPB 750 mg/150 mL  Status:  Discontinued     750 mg 150 mL/hr over 60 Minutes Intravenous Every 36 hours 10/02/19 0934  10/03/19 1243   10/02/19 0800  ceFEPIme (MAXIPIME) 2 g in sodium chloride 0.9 % 100 mL IVPB  Status:  Discontinued     2 g 200 mL/hr over 30 Minutes Intravenous Every 12 hours 10/01/19 1716 10/03/19 1243   10/01/19 1800  vancomycin (VANCOREADY) IVPB 750 mg/150 mL  Status:  Discontinued     750 mg 150 mL/hr over 60 Minutes Intravenous Every 24 hours 10/01/19 1705 10/02/19 0934   10/01/19 1700  vancomycin (VANCOCIN) IVPB 1000 mg/200 mL premix  Status:  Discontinued     1,000 mg 200 mL/hr over 60 Minutes Intravenous Every 24 hours 10/01/2019 1808 10/01/19 1703   10/01/19 0200  ceFEPIme (MAXIPIME) 2 g in sodium chloride 0.9 % 100 mL IVPB  Status:  Discontinued     2 g 200 mL/hr over 30 Minutes Intravenous Every 8 hours 10/02/2019 1808 10/01/19 1716   09/06/2019 1545  vancomycin (VANCOCIN) IVPB 1000 mg/200 mL premix     1,000 mg 200 mL/hr over 60 Minutes Intravenous  Once 09/09/2019 1534 09/07/2019 1830   09/28/2019 1530  ceFEPIme (MAXIPIME) 1 g in sodium chloride 0.9 % 100 mL IVPB     1 g 200 mL/hr over 30 Minutes Intravenous  Once 09/29/2019 1526 10/03/2019 1716        Subjective  Patient examined this a.m., nonresponsive at the time.  Paged by nursing at roughly 5:30 PM as patient had expired.   Objective   Discharge Exam: Vitals:   11-02-19 1100 02-Nov-2019 1200  BP: (!) 124/91 119/75  Pulse:    Resp: (!) 39 (!) 39  Temp:    SpO2:     Vitals:   10-15-2019 1000 10-15-2019 1100 10-15-2019 1200 10/15/2019 1753  BP: 127/85 (!) 124/91 119/75   Pulse: (!) 132     Resp: (!) 41 (!) 39 (!) 39   Temp:      TempSrc:      SpO2: 94%     Weight:    43.1 kg  Height:    5\' 9"  (1.753 m)    Physical Exam from this a.m.  Physical Exam Vitals and nursing note reviewed.  Constitutional:      Interventions: He is not intubated.    Comments: Sleeping, nonresponsive to verbal stimuli  Eyes:     Comments: Closed  Cardiovascular:     Rate and Rhythm: Regular rhythm. Tachycardia present.  Pulmonary:      Effort: Tachypnea present. He is not intubated.  Abdominal:     Palpations: Abdomen is soft.     Tenderness: There is no abdominal tenderness.  Musculoskeletal:     Right lower leg: No tenderness. No edema.     Left lower leg: No tenderness. No edema.    The results of significant diagnostics from this hospitalization (including imaging, microbiology, ancillary and laboratory) are listed below for reference.     Microbiology: Recent Results (from the past 240 hour(s))  Respiratory Panel by RT PCR (Flu A&B, Covid) - Nasopharyngeal Swab     Status: None   Collection Time: 09/22/2019  3:43 PM   Specimen: Nasopharyngeal Swab  Result Value Ref Range Status   SARS Coronavirus 2 by RT PCR NEGATIVE NEGATIVE Final    Comment: (NOTE) SARS-CoV-2 target nucleic acids are NOT DETECTED. The SARS-CoV-2 RNA is generally detectable in upper respiratoy specimens during the acute phase of infection. The lowest concentration of SARS-CoV-2 viral copies this assay can detect is 131 copies/mL. A negative result does not preclude SARS-Cov-2 infection and should not be used as the sole basis for treatment or other patient management decisions. A negative result may occur with  improper specimen collection/handling, submission of specimen other than nasopharyngeal swab, presence of viral mutation(s) within the areas targeted by this assay, and inadequate number of viral copies (<131 copies/mL). A negative result must be combined with clinical observations, patient history, and epidemiological information. The expected result is Negative. Fact Sheet for Patients:  PinkCheek.be Fact Sheet for Healthcare Providers:  GravelBags.it This test is not yet ap proved or cleared by the [place] FDA and  has been authorized for detection and/or diagnosis of SARS-CoV-2 by FDA under an Emergency Use Authorization (EUA). This EUA will remain  in effect  (meaning this test can be used) for the duration of the COVID-19 declaration under Section 564(b)(1) of the Act, 21 U.S.C. section 360bbb-3(b)(1), unless the authorization is terminated or revoked sooner.    Influenza A by PCR NEGATIVE NEGATIVE Final   Influenza B by PCR NEGATIVE NEGATIVE Final    Comment: (NOTE) The Xpert Xpress SARS-CoV-2/FLU/RSV assay is intended as an aid in  the diagnosis of influenza from Nasopharyngeal swab specimens and  should not be used as a sole basis for treatment. Nasal washings and  aspirates are unacceptable for Xpert Xpress SARS-CoV-2/FLU/RSV  testing. Fact Sheet for Patients: PinkCheek.be Fact Sheet for Healthcare Providers: GravelBags.it This test is not yet approved or cleared by the [place] FDA and  has been authorized for detection and/or diagnosis of SARS-CoV-2 by  FDA under an  Emergency Use Authorization (EUA). This EUA will remain  in effect (meaning this test can be used) for the duration of the  Covid-19 declaration under Section 564(b)(1) of the Act, 21  U.S.C. section 360bbb-3(b)(1), unless the authorization is  terminated or revoked. Performed at [facility], [place] [address], [place], [place] [address]   Respiratory Panel by PCR     Status: None   Collection Time: 09/20/2019  3:43 PM   Specimen: Nasopharyngeal Swab; Respiratory  Result Value Ref Range Status   Adenovirus NOT DETECTED NOT DETECTED Final   Coronavirus 229E NOT DETECTED NOT DETECTED Final    Comment: (NOTE) The Coronavirus on the Respiratory Panel, DOES NOT test for the novel  Coronavirus (2019 nCoV)    Coronavirus HKU1 NOT DETECTED NOT DETECTED Final   Coronavirus NL63 NOT DETECTED NOT DETECTED Final   Coronavirus OC43 NOT DETECTED NOT DETECTED Final   Metapneumovirus NOT DETECTED NOT DETECTED Final   Rhinovirus / Enterovirus NOT DETECTED NOT DETECTED Final   Influenza A NOT DETECTED  NOT DETECTED Final   Influenza B NOT DETECTED NOT DETECTED Final   Parainfluenza Virus 1 NOT DETECTED NOT DETECTED Final   Parainfluenza Virus 2 NOT DETECTED NOT DETECTED Final   Parainfluenza Virus 3 NOT DETECTED NOT DETECTED Final   Parainfluenza Virus 4 NOT DETECTED NOT DETECTED Final   Respiratory Syncytial Virus NOT DETECTED NOT DETECTED Final   Bordetella pertussis NOT DETECTED NOT DETECTED Final   Chlamydophila pneumoniae NOT DETECTED NOT DETECTED Final   Mycoplasma pneumoniae NOT DETECTED NOT DETECTED Final    Comment: Performed at [facility], [place]. [address] [place], [place] [address]  Blood culture (routine x 2)     Status: None   Collection Time: 09/15/2019  4:20 PM   Specimen: BLOOD LEFT HAND  Result Value Ref Range Status   Specimen Description BLOOD LEFT HAND  Final   Special Requests   Final    BOTTLES DRAWN AEROBIC AND ANAEROBIC Blood Culture results may not be optimal due to an inadequate volume of blood received in culture bottles Performed at [facility], [place] [address] [place], [place] [address]    Culture NO GROWTH 5 DAYS  Final   Report Status 10/05/2019 FINAL  Final  Blood culture (routine x 2)     Status: None   Collection Time: 09/06/2019  4:30 PM   Specimen: BLOOD  Result Value Ref Range Status   Specimen Description BLOOD RIGHT ANTECUBITAL  Final   Special Requests   Final    BOTTLES DRAWN AEROBIC ONLY Blood Culture results may not be optimal due to an inadequate volume of blood received in culture bottles Performed at [facility], [place] [address] [place], [place] [address]    Culture NO GROWTH 5 DAYS  Final   Report Status 10/05/2019 FINAL  Final  Urine culture     Status: Abnormal   Collection Time: 09/27/2019  6:30 PM   Specimen: Urine, Random  Result Value Ref Range Status   Specimen Description   Final    URINE, RANDOM Performed at [place] [address], [place], [place]  [address]    Special Requests   Final    NONE Performed at [facility], [place] [address], [place], [place] [address]    Culture >=100,000 COLONIES/mL CITROBACTER KOSERI (A)  Final   Report Status 10/02/2019 FINAL  Final   Organism ID, Bacteria CITROBACTER KOSERI (A)  Final      Susceptibility   Citrobacter koseri - MIC*    CEFAZOLIN <=4 SENSITIVE Sensitive     CEFTRIAXONE <=0.25 SENSITIVE Sensitive     CIPROFLOXACIN <=0.25 SENSITIVE Sensitive     GENTAMICIN <=1 SENSITIVE Sensitive     IMIPENEM <=0.25 SENSITIVE Sensitive     NITROFURANTOIN <=16 SENSITIVE Sensitive     TRIMETH/SULFA <=20 SENSITIVE Sensitive     PIP/TAZO <=4 SENSITIVE Sensitive     * >=100,000 COLONIES/mL CITROBACTER KOSERI  MRSA PCR Screening     Status: None   Collection Time: 10/01/2019  8:28 PM   Specimen: Nasopharyngeal  Result Value Ref Range Status   MRSA by PCR NEGATIVE NEGATIVE Final    Comment:        The GeneXpert MRSA Assay (FDA approved for NASAL specimens only), is one component of a comprehensive MRSA colonization surveillance program. It is not intended to diagnose MRSA infection nor to guide or monitor treatment for MRSA infections. Performed at [facility], [place] [address], [place], [place] [address]   Culture, blood (routine x 2)     Status: None   Collection Time: 09/27/2019  8:39 PM   Specimen: BLOOD  Result Value Ref Range Status   Specimen Description BLOOD BLOOD RIGHT ARM  Final   Special Requests   Final    BOTTLES DRAWN AEROBIC AND ANAEROBIC Blood Culture adequate volume Performed at [place] [address], [place], [place] [address]    Culture NO GROWTH 5 DAYS  Final   Report Status 10/05/2019 FINAL  Final  Culture, blood (routine x 2)     Status: None   Collection Time: 09/04/2019  8:40 PM   Specimen: BLOOD  Result Value Ref Range Status   Specimen Description BLOOD BLOOD RIGHT ARM  Final   Special Requests   Final     BOTTLES DRAWN AEROBIC AND ANAEROBIC Blood Culture adequate volume Performed at [place] [address], [place], [place] [address]    Culture NO GROWTH 5 DAYS  Final   Report Status 10/05/2019 FINAL  Final  Culture, respiratory (non-expectorated)     Status: None   Collection Time: 10/01/19 10:07 AM   Specimen: Tracheal Aspirate; Respiratory  Result Value Ref Range Status   Specimen Description   Final    TRACHEAL ASPIRATE Performed at [place] [address] [place], [place] [address]    Special Requests   Final    Normal Performed at [facility], [place] [address] [place], [place] [address]    Gram Stain   Final    ABUNDANT WBC PRESENT, PREDOMINANTLY PMN MODERATE GRAM NEGATIVE RODS FEW GRAM POSITIVE COCCI RARE GRAM POSITIVE RODS Performed at [facility], [place] [address], [place], [place] [address]    Culture   Final    ABUNDANT CITROBACTER KOSERI ABUNDANT STAPHYLOCOCCUS AUREUS    Report Status 10/04/2019 FINAL  Final   Organism ID, Bacteria STAPHYLOCOCCUS AUREUS  Final   Organism ID, Bacteria CITROBACTER KOSERI  Final      Susceptibility   Citrobacter koseri - MIC*    CEFAZOLIN <=4 SENSITIVE Sensitive     CEFEPIME <=0.12 SENSITIVE Sensitive     CEFTAZIDIME <=1 SENSITIVE Sensitive     CEFTRIAXONE <=0.25 SENSITIVE Sensitive     CIPROFLOXACIN <=0.25 SENSITIVE Sensitive     GENTAMICIN <=1 SENSITIVE Sensitive     IMIPENEM <=0.25 SENSITIVE Sensitive     TRIMETH/SULFA <=20  SENSITIVE Sensitive     PIP/TAZO <=4 SENSITIVE Sensitive     * ABUNDANT CITROBACTER KOSERI   Staphylococcus aureus - MIC*    CIPROFLOXACIN 2 INTERMEDIATE Intermediate     ERYTHROMYCIN RESISTANT Resistant     GENTAMICIN <=0.5 SENSITIVE Sensitive     OXACILLIN 0.5 SENSITIVE Sensitive     TETRACYCLINE <=1 SENSITIVE Sensitive     VANCOMYCIN <=0.5 SENSITIVE Sensitive     TRIMETH/SULFA <=10 SENSITIVE Sensitive     CLINDAMYCIN RESISTANT  Resistant     RIFAMPIN <=0.5 SENSITIVE Sensitive     Inducible Clindamycin POSITIVE Resistant     * ABUNDANT STAPHYLOCOCCUS AUREUS     Labs: BNP (last 3 results) Recent Labs    08/21/19 2037 09/19/2019 1509  BNP 49.0 323.5*   Basic Metabolic Panel: Recent Labs  Lab 10/01/19 0243 10/01/19 0243 10/01/19 0950 10/01/19 1640 10/02/19 0519 10/02/19 1704 10/03/19 0218 10/04/19 0347 10/05/19 0300  NA 128*  --   --   --  132*  --  136 143 145  K 4.5  --   --   --  4.5  --  3.5 2.9* 3.3*  CL 90*  --   --   --  95*  --  99 103 108  CO2 23  --   --   --  23  --  24 24 27   GLUCOSE 89  --   --   --  125*  --  154* 95 119*  BUN 37*  --   --   --  68*  --  74* 52* 58*  CREATININE 1.14  --   --   --  1.47*  --  1.01 0.63 0.73  CALCIUM 7.7*  --   --   --  8.0*  --  8.2* 8.4* 8.4*  MG 2.1   < > 2.1 2.3 2.3 2.5*  --  2.3  --   PHOS 5.8*  --  6.0* 6.0* 4.7* 3.0  --   --   --    < > = values in this interval not displayed.   Liver Function Tests: Recent Labs  Lab 09/29/2019 1509 09/08/2019 1830 10/02/19 0519  AST 27 111* 71*  ALT 18 50* 45*  ALKPHOS 143* 108 85  BILITOT 0.5 0.9 0.5  PROT 7.3 6.0* 5.9*  ALBUMIN 2.1* 1.6* 1.8*   Recent Labs  Lab 09/29/2019 1830  LIPASE 13  AMYLASE 187*   No results for input(s): AMMONIA in the last 168 hours. CBC: Recent Labs  Lab 09/12/2019 1830 09/11/2019 1830 10/01/19 0243 10/02/19 0519 10/03/19 0218 10/04/19 0347 10/05/19 0300  WBC 21.4*   < > 14.5* 27.3* 30.1* 38.6* 38.1*  NEUTROABS 19.7*  --   --   --   --   --   --   HGB 11.0*   < > 9.0* 8.7* 8.3* 9.8* 8.3*  HCT 32.7*   < > 25.7* 25.6* 24.2* 29.5* 24.6*  MCV 98.5   < > 98.1 98.8 96.8 99.0 100.0  PLT 290   < > 227 254 223 244 222   < > = values in this interval not displayed.   Cardiac Enzymes: No results for input(s): CKTOTAL, CKMB, CKMBINDEX, TROPONINI in the last 168 hours. BNP: Invalid input(s): POCBNP CBG: Recent Labs  Lab 10/05/19 0821 10/05/19 1131 10/05/19 1233  10/05/19 1635 October 16, 2019 0921  GLUCAP 113* 59* 114* 113* 79   D-Dimer No results for input(s): DDIMER in the last 72 hours. Hgb A1c  No results for input(s): HGBA1C in the last 72 hours. Lipid Profile No results for input(s): CHOL, HDL, LDLCALC, TRIG, CHOLHDL, LDLDIRECT in the last 72 hours. Thyroid function studies No results for input(s): TSH, T4TOTAL, T3FREE, THYROIDAB in the last 72 hours.  Invalid input(s): FREET3 Anemia work up No results for input(s): VITAMINB12, FOLATE, FERRITIN, TIBC, IRON, RETICCTPCT in the last 72 hours. Urinalysis    Component Value Date/Time   COLORURINE YELLOW 04/04/2010 0059   APPEARANCEUR CLOUDY (A) 04/04/2010 0059   LABSPEC 1.005 04/04/2010 0059   PHURINE 5.0 04/04/2010 0059   GLUCOSEU NEGATIVE 04/04/2010 0059   HGBUR NEGATIVE 04/04/2010 0059   BILIRUBINUR NEGATIVE 04/04/2010 0059   KETONESUR NEGATIVE 04/04/2010 0059   PROTEINUR NEGATIVE 04/04/2010 0059   UROBILINOGEN 0.2 04/04/2010 0059   NITRITE NEGATIVE 04/04/2010 0059   LEUKOCYTESUR  04/04/2010 0059    NEGATIVE MICROSCOPIC NOT DONE ON URINES WITH NEGATIVE PROTEIN, BLOOD, LEUKOCYTES, NITRITE, OR GLUCOSE <1000 mg/dL.   Sepsis Labs Invalid input(s): PROCALCITONIN,  WBC,  LACTICIDVEN Microbiology Recent Results (from the past 240 hour(s))  Respiratory Panel by RT PCR (Flu A&B, Covid) - Nasopharyngeal Swab     Status: None   Collection Time: 09/10/2019  3:43 PM   Specimen: Nasopharyngeal Swab  Result Value Ref Range Status   SARS Coronavirus 2 by RT PCR NEGATIVE NEGATIVE Final    Comment: (NOTE) SARS-CoV-2 target nucleic acids are NOT DETECTED. The SARS-CoV-2 RNA is generally detectable in upper respiratoy specimens during the acute phase of infection. The lowest concentration of SARS-CoV-2 viral copies this assay can detect is 131 copies/mL. A negative result does not preclude SARS-Cov-2 infection and should not be used as the sole basis for treatment or other patient management  decisions. A negative result may occur with  improper specimen collection/handling, submission of specimen other than nasopharyngeal swab, presence of viral mutation(s) within the areas targeted by this assay, and inadequate number of viral copies (<131 copies/mL). A negative result must be combined with clinical observations, patient history, and epidemiological information. The expected result is Negative. Fact Sheet for Patients:  PinkCheek.be Fact Sheet for Healthcare Providers:  GravelBags.it This test is not yet ap proved or cleared by the [place] FDA and  has been authorized for detection and/or diagnosis of SARS-CoV-2 by FDA under an Emergency Use Authorization (EUA). This EUA will remain  in effect (meaning this test can be used) for the duration of the COVID-19 declaration under Section 564(b)(1) of the Act, 21 U.S.C. section 360bbb-3(b)(1), unless the authorization is terminated or revoked sooner.    Influenza A by PCR NEGATIVE NEGATIVE Final   Influenza B by PCR NEGATIVE NEGATIVE Final    Comment: (NOTE) The Xpert Xpress SARS-CoV-2/FLU/RSV assay is intended as an aid in  the diagnosis of influenza from Nasopharyngeal swab specimens and  should not be used as a sole basis for treatment. Nasal washings and  aspirates are unacceptable for Xpert Xpress SARS-CoV-2/FLU/RSV  testing. Fact Sheet for Patients: PinkCheek.be Fact Sheet for Healthcare Providers: GravelBags.it This test is not yet approved or cleared by the [place] FDA and  has been authorized for detection and/or diagnosis of SARS-CoV-2 by  FDA under an Emergency Use Authorization (EUA). This EUA will remain  in effect (meaning this test can be used) for the duration of the  Covid-19 declaration under Section 564(b)(1) of the Act, 21  U.S.C. section 360bbb-3(b)(1), unless the authorization  is  terminated or revoked. Performed at [facility], [place]  [address] [place], [place] [address]   Respiratory Panel by PCR     Status: None   Collection Time: 10/03/2019  3:43 PM   Specimen: Nasopharyngeal Swab; Respiratory  Result Value Ref Range Status   Adenovirus NOT DETECTED NOT DETECTED Final   Coronavirus 229E NOT DETECTED NOT DETECTED Final    Comment: (NOTE) The Coronavirus on the Respiratory Panel, DOES NOT test for the novel  Coronavirus (2019 nCoV)    Coronavirus HKU1 NOT DETECTED NOT DETECTED Final   Coronavirus NL63 NOT DETECTED NOT DETECTED Final   Coronavirus OC43 NOT DETECTED NOT DETECTED Final   Metapneumovirus NOT DETECTED NOT DETECTED Final   Rhinovirus / Enterovirus NOT DETECTED NOT DETECTED Final   Influenza A NOT DETECTED NOT DETECTED Final   Influenza B NOT DETECTED NOT DETECTED Final   Parainfluenza Virus 1 NOT DETECTED NOT DETECTED Final   Parainfluenza Virus 2 NOT DETECTED NOT DETECTED Final   Parainfluenza Virus 3 NOT DETECTED NOT DETECTED Final   Parainfluenza Virus 4 NOT DETECTED NOT DETECTED Final   Respiratory Syncytial Virus NOT DETECTED NOT DETECTED Final   Bordetella pertussis NOT DETECTED NOT DETECTED Final   Chlamydophila pneumoniae NOT DETECTED NOT DETECTED Final   Mycoplasma pneumoniae NOT DETECTED NOT DETECTED Final    Comment: Performed at [facility], [place]. [address], [place], [place] [address]  Blood culture (routine x 2)     Status: None   Collection Time: 09/27/2019  4:20 PM   Specimen: BLOOD LEFT HAND  Result Value Ref Range Status   Specimen Description BLOOD LEFT HAND  Final   Special Requests   Final    BOTTLES DRAWN AEROBIC AND ANAEROBIC Blood Culture results may not be optimal due to an inadequate volume of blood received in culture bottles Performed at [facility], [place] [address] [place], [place] [address]    Culture NO GROWTH 5 DAYS  Final   Report Status 10/05/2019 FINAL  Final   Blood culture (routine x 2)     Status: None   Collection Time: 10/02/2019  4:30 PM   Specimen: BLOOD  Result Value Ref Range Status   Specimen Description BLOOD RIGHT ANTECUBITAL  Final   Special Requests   Final    BOTTLES DRAWN AEROBIC ONLY Blood Culture results may not be optimal due to an inadequate volume of blood received in culture bottles Performed at [facility], [place] [address], [place], [place] [address]    Culture NO GROWTH 5 DAYS  Final   Report Status 10/05/2019 FINAL  Final  Urine culture     Status: Abnormal   Collection Time: 09/17/2019  6:30 PM   Specimen: Urine, Random  Result Value Ref Range Status   Specimen Description   Final    URINE, RANDOM Performed at [place] [address], [place], [place] [address]    Special Requests   Final    NONE Performed at [facility], [place] [address] [place], [place] [address]    Culture >=100,000 COLONIES/mL CITROBACTER KOSERI (A)  Final   Report Status 10/02/2019 FINAL  Final   Organism ID, Bacteria CITROBACTER KOSERI (A)  Final      Susceptibility   Citrobacter koseri - MIC*    CEFAZOLIN <=4 SENSITIVE Sensitive     CEFTRIAXONE <=0.25 SENSITIVE Sensitive     CIPROFLOXACIN <=0.25 SENSITIVE Sensitive     GENTAMICIN <=1 SENSITIVE Sensitive     IMIPENEM <=0.25 SENSITIVE Sensitive  NITROFURANTOIN <=16 SENSITIVE Sensitive     TRIMETH/SULFA <=20 SENSITIVE Sensitive     PIP/TAZO <=4 SENSITIVE Sensitive     * >=100,000 COLONIES/mL CITROBACTER KOSERI  MRSA PCR Screening     Status: None   Collection Time: 09/05/2019  8:28 PM   Specimen: Nasopharyngeal  Result Value Ref Range Status   MRSA by PCR NEGATIVE NEGATIVE Final    Comment:        The GeneXpert MRSA Assay (FDA approved for NASAL specimens only), is one component of a comprehensive MRSA colonization surveillance program. It is not intended to diagnose MRSA infection nor to guide or monitor treatment  for MRSA infections. Performed at [facility], [place] [address] [place], [place] [address]   Culture, blood (routine x 2)     Status: None   Collection Time: 09/16/2019  8:39 PM   Specimen: BLOOD  Result Value Ref Range Status   Specimen Description BLOOD BLOOD RIGHT ARM  Final   Special Requests   Final    BOTTLES DRAWN AEROBIC AND ANAEROBIC Blood Culture adequate volume Performed at [place] [address], [place], [place] [address]    Culture NO GROWTH 5 DAYS  Final   Report Status 10/05/2019 FINAL  Final  Culture, blood (routine x 2)     Status: None   Collection Time: 09/18/2019  8:40 PM   Specimen: BLOOD  Result Value Ref Range Status   Specimen Description BLOOD BLOOD RIGHT ARM  Final   Special Requests   Final    BOTTLES DRAWN AEROBIC AND ANAEROBIC Blood Culture adequate volume Performed at [place] [address], [place], [place] [address]    Culture NO GROWTH 5 DAYS  Final   Report Status 10/05/2019 FINAL  Final  Culture, respiratory (non-expectorated)     Status: None   Collection Time: 10/01/19 10:07 AM   Specimen: Tracheal Aspirate; Respiratory  Result Value Ref Range Status   Specimen Description   Final    TRACHEAL ASPIRATE Performed at [facility] [address] [place], [place] [address]    Special Requests   Final    Normal Performed at [facility], [place] [address] [place], [place] [address]    Gram Stain   Final    ABUNDANT WBC PRESENT, PREDOMINANTLY PMN MODERATE GRAM NEGATIVE RODS FEW GRAM POSITIVE COCCI RARE GRAM POSITIVE RODS Performed at [facility], [place] [address] [place], [place] [address]    Culture   Final    ABUNDANT CITROBACTER KOSERI ABUNDANT STAPHYLOCOCCUS AUREUS    Report Status 10/04/2019 FINAL  Final   Organism ID, Bacteria STAPHYLOCOCCUS AUREUS  Final   Organism ID, Bacteria CITROBACTER KOSERI  Final       Susceptibility   Citrobacter koseri - MIC*    CEFAZOLIN <=4 SENSITIVE Sensitive     CEFEPIME <=0.12 SENSITIVE Sensitive     CEFTAZIDIME <=1 SENSITIVE Sensitive     CEFTRIAXONE <=0.25 SENSITIVE Sensitive     CIPROFLOXACIN <=0.25 SENSITIVE Sensitive     GENTAMICIN <=1 SENSITIVE Sensitive     IMIPENEM <=0.25 SENSITIVE Sensitive     TRIMETH/SULFA <=20 SENSITIVE Sensitive     PIP/TAZO <=4 SENSITIVE Sensitive     * ABUNDANT CITROBACTER KOSERI   Staphylococcus aureus - MIC*    CIPROFLOXACIN 2 INTERMEDIATE Intermediate     ERYTHROMYCIN RESISTANT Resistant     GENTAMICIN <=0.5 SENSITIVE Sensitive     OXACILLIN 0.5 SENSITIVE Sensitive  TETRACYCLINE <=1 SENSITIVE Sensitive     VANCOMYCIN <=0.5 SENSITIVE Sensitive     TRIMETH/SULFA <=10 SENSITIVE Sensitive     CLINDAMYCIN RESISTANT Resistant     RIFAMPIN <=0.5 SENSITIVE Sensitive     Inducible Clindamycin POSITIVE Resistant     * ABUNDANT STAPHYLOCOCCUS AUREUS    Discharge Instructions      Allergies as of 10-08-2019   No Known Allergies     Medication List    STOP taking these medications   amLODipine 10 MG tablet Commonly known as: NORVASC   dexamethasone 4 MG tablet Commonly known as: DECADRON   docusate sodium 100 MG capsule Commonly known as: COLACE   fluconazole 100 MG tablet Commonly known as: DIFLUCAN   Fluticasone-Salmeterol 100-50 MCG/DOSE Aepb Commonly known as: ADVAIR   folic acid 1 MG tablet Commonly known as: FOLVITE   furosemide 20 MG tablet Commonly known as: LASIX   ibuprofen 800 MG tablet Commonly known as: ADVIL   ipratropium-albuterol 0.5-2.5 (3) MG/3ML Soln Commonly known as: DUONEB   magic mouthwash Soln   metoprolol tartrate 25 MG tablet Commonly known as: LOPRESSOR   omeprazole 40 MG capsule Commonly known as: PRILOSEC   oxyCODONE-acetaminophen 5-325 MG tablet Commonly known as: PERCOCET/ROXICET   ProAir RespiClick 176 (90 Base) MCG/ACT Aepb Generic drug: Albuterol Sulfate    prochlorperazine 10 MG tablet Commonly known as: COMPAZINE       No Known Allergies  Time coordinating discharge: Under 30 minutes  SIGNED:   Harold Hedge, D.O. [facility] Pager: [phone]  2019-10-08, 6:11 PM

## 2019-11-01 DEATH — deceased

## 2019-12-06 ENCOUNTER — Ambulatory Visit (INDEPENDENT_AMBULATORY_CARE_PROVIDER_SITE_OTHER): Payer: PRIVATE HEALTH INSURANCE | Admitting: Primary Care

## 2020-01-26 ENCOUNTER — Telehealth: Payer: Self-pay | Admitting: Internal Medicine

## 2020-01-26 NOTE — Telephone Encounter (Signed)
Faxed medical records to [facility] to [phone], Release FB:[id number]

## 2020-07-04 IMAGING — DX DG ABDOMEN 1V
1 series · 1 of 1 positions shown · non-contrast
Comparison: Portable exam 7378 hours compared to 04/04/2010

CLINICAL DATA: Or gas tube placement

EXAM:
ABDOMEN - 1 VIEW

[abdomen kub]
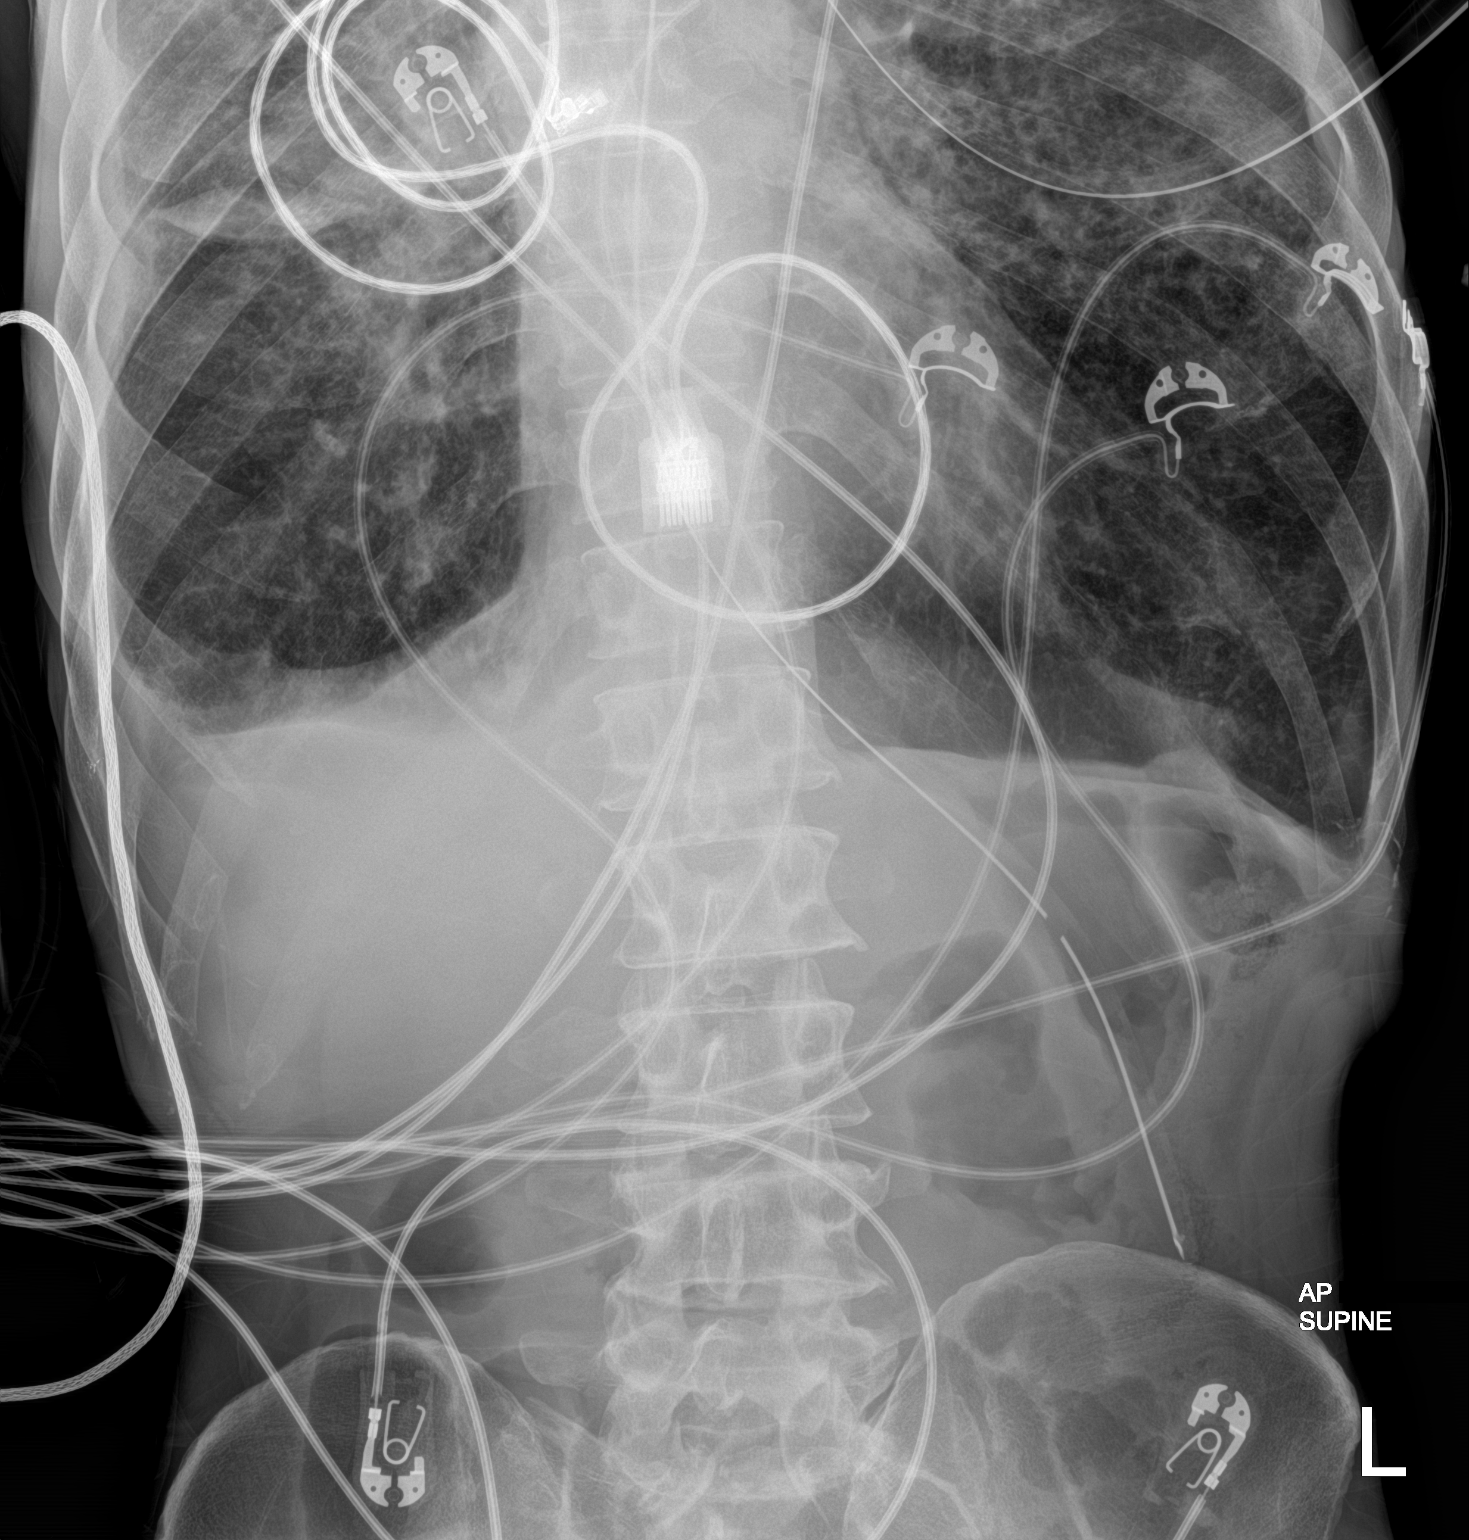

[1 of 1 positions shown; findings below may reference images not displayed]

FINDINGS: Tip of orogastric tube projects over mid stomach.

Nonobstructive bowel gas pattern.

Emphysematous changes with perihilar infiltrates and small RIGHT
pleural effusion.

Osseous structures unremarkable.
IMPRESSION: Tip of orogastric tube projects over mid stomach.

## 2020-07-04 IMAGING — CT CT CHEST W/O CM
2 of 3 series · 14 of 36 positions shown, 17 images · non-contrast
Comparison: CTA chest 08/25/2019. Portable chest radiographs
earlier today.

CLINICAL DATA: 58-year-old male with history of lung cancer and
increasing shortness of breath.

EXAM:
CT CHEST WITHOUT CONTRAST
TECHNIQUE: Multidetector CT imaging of the chest was performed following the
standard protocol without IV contrast.

[Series 3: thorax · axial · 0.65mm/px · z∈[+1025,+1367]mm · 11 of 201 slices shown, 14 images]
[im 15/201  mediastinal]
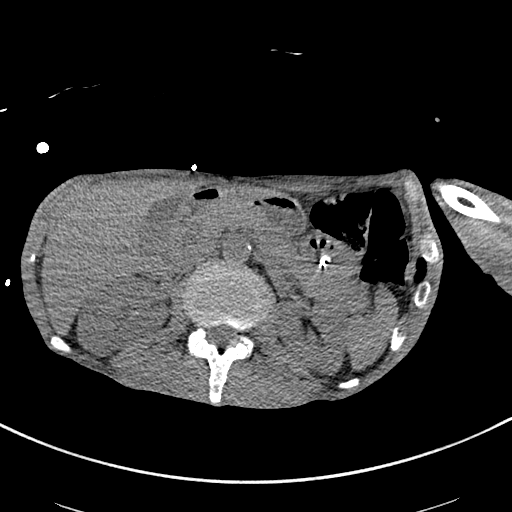
[im 15/201  lung]
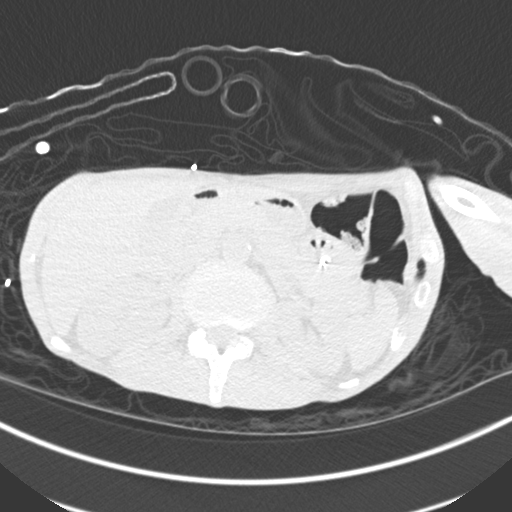
[im 30/201  lung]
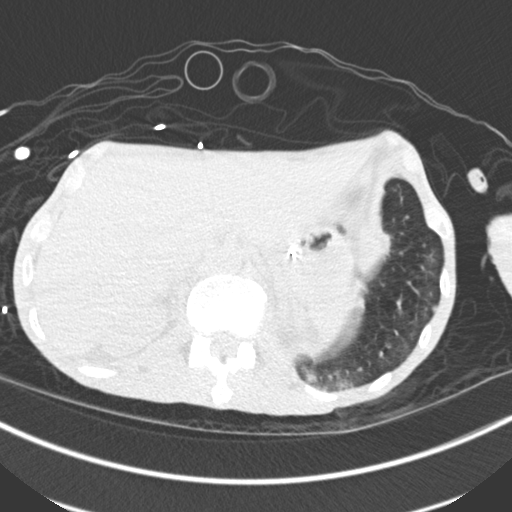
[im 45/201  lung]
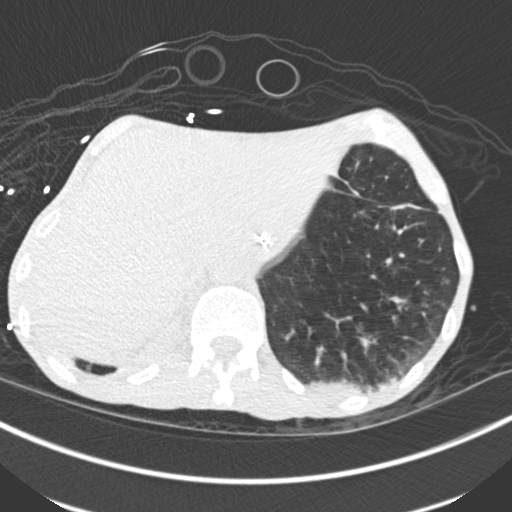
[im 67/201  lung]
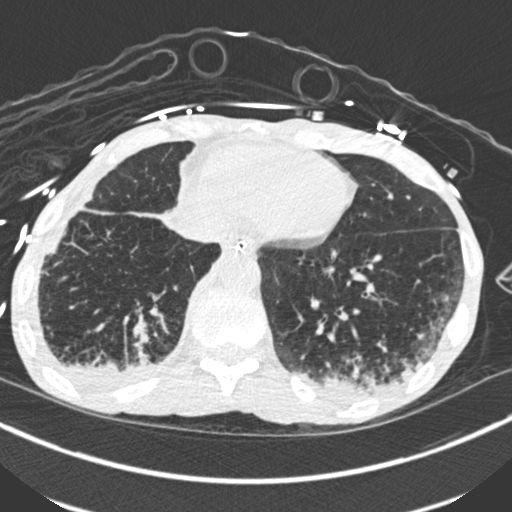
[im 82/201  mediastinal]
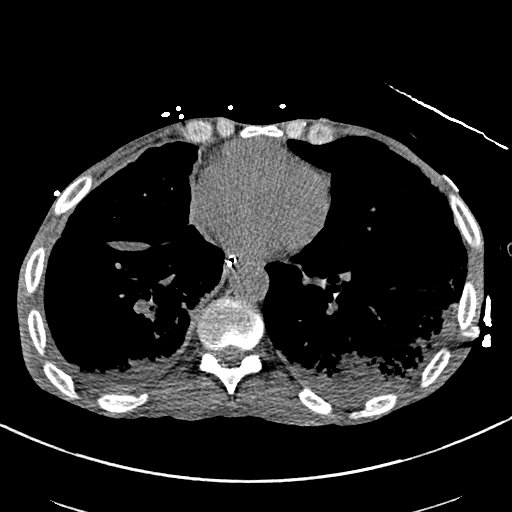
[im 82/201  lung]
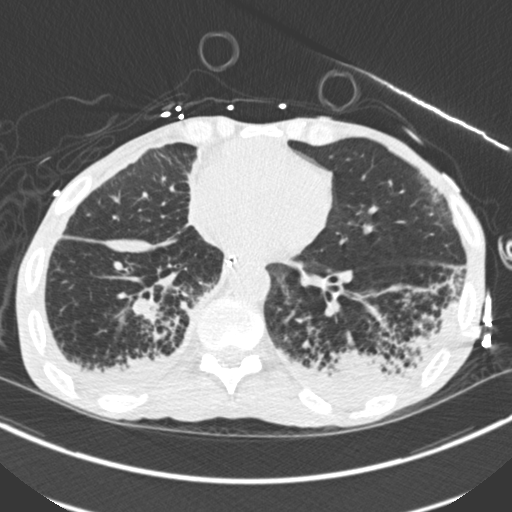
[im 104/201  lung]
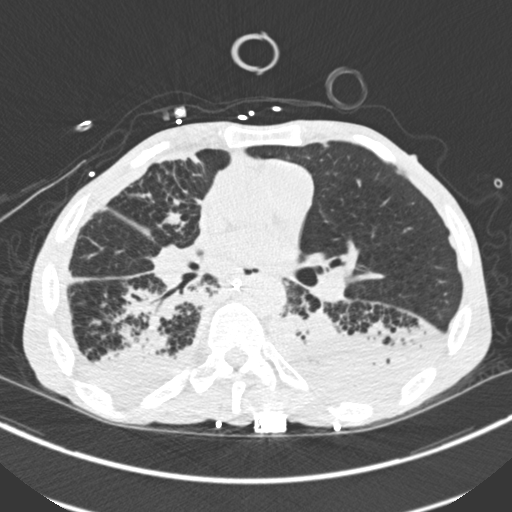
[im 119/201  lung]
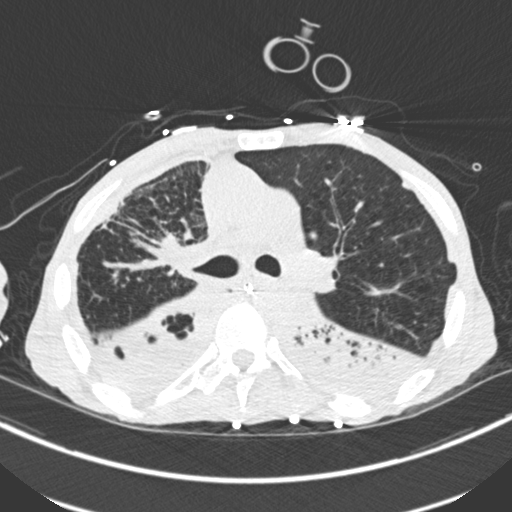
[im 134/201  lung]
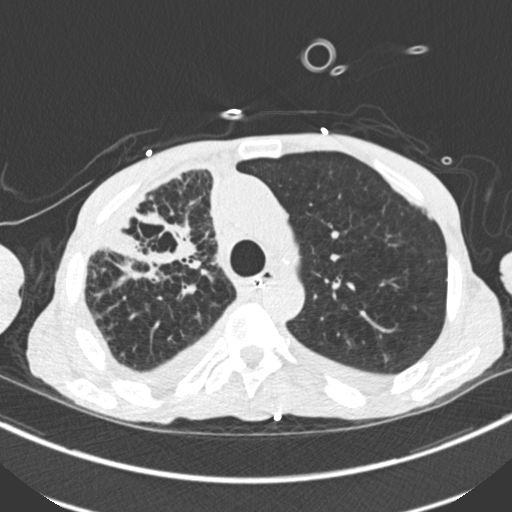
[im 156/201  mediastinal]
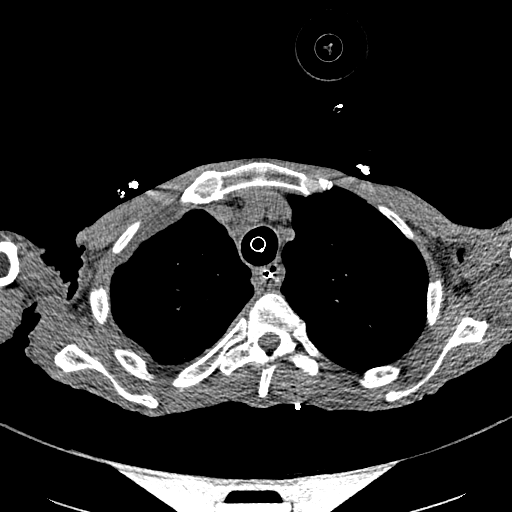
[im 156/201  lung]
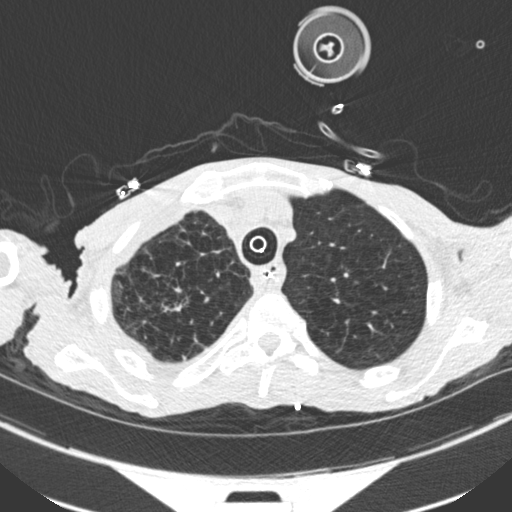
[im 171/201  lung]
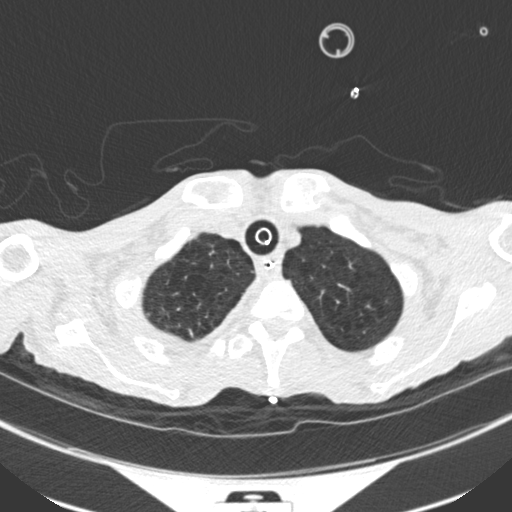
[im 186/201  lung]
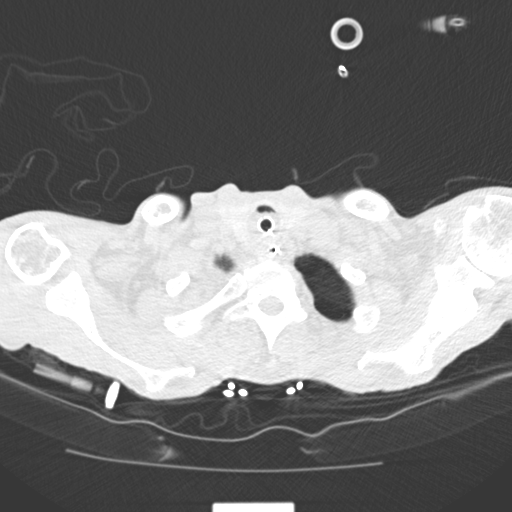

[Series 6: coronal · coronal · 0.68mm/px · 3 of 106 slices shown]
[im 22/106  lung]
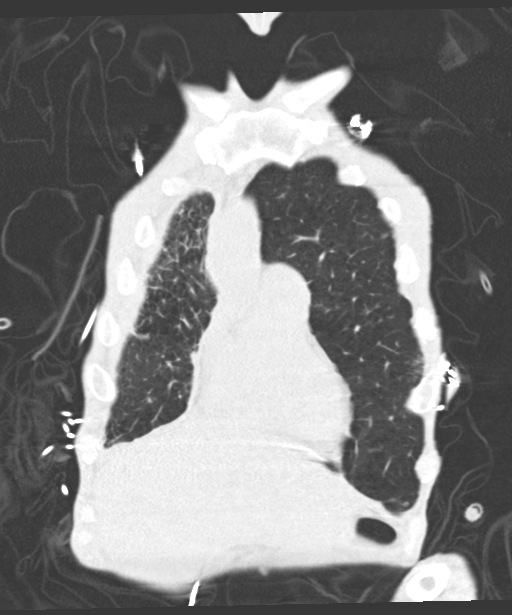
[im 43/106  lung]
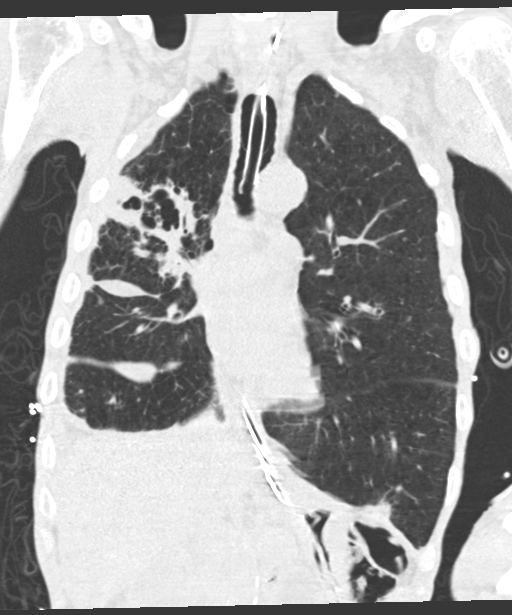
[im 64/106  lung]
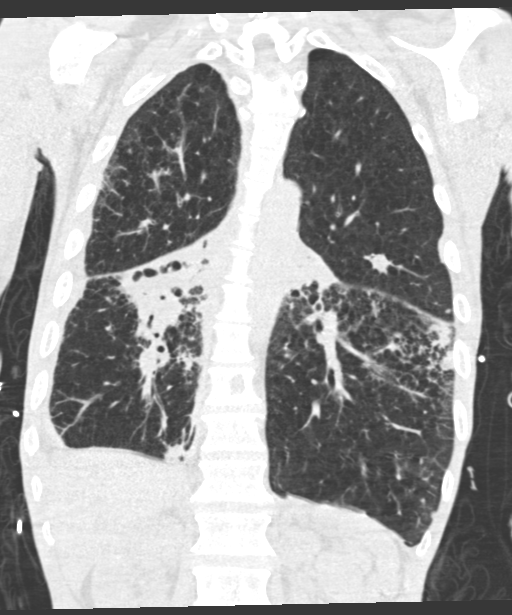

[14 of 36 positions shown; findings below may reference images not displayed]

FINDINGS: Cardiovascular: Vascular patency is not evaluated in the absence of
IV contrast. No cardiomegaly or pericardial effusion. Minimal
calcified aortic atherosclerosis.

Mediastinum/Nodes: Infiltrative right hilar tumor is less apparent
in the absence of IV contrast today. Elsewhere mediastinal lymph
nodes remain within normal limits. An enteric tube courses through
the esophagus and into the stomach.

Lungs/Pleura: Intubated. Endotracheal tube tip above the carina. The
major airways are patent, but there extensive new bilateral superior
segment lower lobe lung opacification with areas of cavitation
suspected on series 5, image 82. There is continued more dependent
opacity elsewhere in the lower lobes, which abates at the
costophrenic angles.

This is superimposed on an infiltrative right hilar mass with
increased spiculation contiguous with the peripheral pleura of the
inferior right upper lobe on series 5, image 77, as well as new
cavitation within the adjacent opacified upper lobe there (image
66). There is also superimposed increased inferior right lower lobe
nodular interstitial thickening suspicious for lymphangitic
carcinoma (series 5, image 82).

The right middle lobe is relatively stable.

There is new posterior left upper lobe patchy and peribronchial
opacity such as on series 5, image 87. There is new lingula
subpleural patchy and spiculated opacity on image 117.

No definite pleural fluid.

Upper Abdomen: Enteric tube continues into the stomach which is
relatively decompressed. Negative visible noncontrast liver, spleen,
pancreas, adrenal glands and kidneys. The gallbladder appears more
distended on series 3, image 201, and there is a fluid-filled bowel
loop partially visible in the left abdomen on that image also.

Musculoskeletal: Mildly enlarged lytic metastasis of the T11
vertebra on sagittal image 81. underlying osteopenia.

Lytic destruction of the posterior right 1st rib appears stable.
Cachexia.

No other acute or suspicious osseous lesion identified.
IMPRESSION: 1. Severe bilateral superior segment lower lobe opacity with
cavitation is new since [REDACTED] and suspicious for Necrotizing
Pneumonia.
There is similar new cavitation in consolidated right upper lobe
lung adjacent to the right perihilar tumor.
Additional dependent bilateral lung base and peribronchial left
upper lobe opacity is also suspicious for acute infection.
No definite pleural effusion.

2. Suspicion of some right lung tumor progression since [REDACTED] on
this noncontrast exam. Increased nodular interstitial changes in
that region suspicious for lymphangitic carcinoma.

3. Lytic metastasis of the T11 vertebral body is slightly enlarged
since [DATE]. ET tube and enteric tube placement satisfactory.

## 2023-07-23 NOTE — Telephone Encounter (Signed)
Telephone call
